# Patient Record
Sex: Female | Born: 1990
Health system: Southern US, Community
[De-identification: ages and names within clinical notes are randomized; demographics above are authoritative.]

## PROBLEM LIST (undated history)

## (undated) DIAGNOSIS — A630 Anogenital (venereal) warts: Secondary | ICD-10-CM

## (undated) DIAGNOSIS — F419 Anxiety disorder, unspecified: Secondary | ICD-10-CM

## (undated) DIAGNOSIS — F32A Depression, unspecified: Secondary | ICD-10-CM

## (undated) DIAGNOSIS — G43909 Migraine, unspecified, not intractable, without status migrainosus: Secondary | ICD-10-CM

## (undated) DIAGNOSIS — F329 Major depressive disorder, single episode, unspecified: Secondary | ICD-10-CM

## (undated) DIAGNOSIS — F4310 Post-traumatic stress disorder, unspecified: Secondary | ICD-10-CM

## (undated) DIAGNOSIS — I10 Essential (primary) hypertension: Secondary | ICD-10-CM

## (undated) HISTORY — DX: Migraine, unspecified, not intractable, without status migrainosus: G43.909

## (undated) HISTORY — PX: DG SCOLIOSIS SERIES (ARMC HX): HXRAD1605

## (undated) HISTORY — DX: Post-traumatic stress disorder, unspecified: F43.10

## (undated) HISTORY — DX: Essential (primary) hypertension: I10

---
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
## 2011-01-04 LAB — POCT PREGNANCY, URINE: Preg Test, Ur: NEGATIVE

## 2011-01-04 LAB — POCT URINALYSIS DIPSTICK
Glucose, UA: NEGATIVE mg/dL
Ketones, ur: NEGATIVE mg/dL
Protein, ur: NEGATIVE mg/dL
Specific Gravity, Urine: 1.015 (ref 1.005–1.030)

## 2011-01-08 LAB — URINALYSIS, ROUTINE W REFLEX MICROSCOPIC
Bilirubin Urine: NEGATIVE
Glucose, UA: NEGATIVE mg/dL
Hgb urine dipstick: NEGATIVE
Ketones, ur: NEGATIVE mg/dL
Nitrite: NEGATIVE
Protein, ur: NEGATIVE mg/dL
Specific Gravity, Urine: 1.015 (ref 1.005–1.030)
Urobilinogen, UA: 0.2 mg/dL (ref 0.0–1.0)
pH: 6 (ref 5.0–8.0)

## 2011-01-08 LAB — POCT PREGNANCY, URINE
Preg Test, Ur: NEGATIVE
Preg Test, Ur: NEGATIVE

## 2011-01-08 LAB — PREGNANCY, URINE: Preg Test, Ur: NEGATIVE

## 2011-01-27 LAB — GC/CHLAMYDIA PROBE AMP, GENITAL
Chlamydia, DNA Probe: NEGATIVE
GC Probe Amp, Genital: NEGATIVE

## 2011-01-27 LAB — URINALYSIS, ROUTINE W REFLEX MICROSCOPIC
Bilirubin Urine: NEGATIVE
Ketones, ur: NEGATIVE mg/dL
Nitrite: NEGATIVE
Urobilinogen, UA: 0.2 mg/dL (ref 0.0–1.0)

## 2011-01-27 LAB — WET PREP, GENITAL
Trich, Wet Prep: NONE SEEN
WBC, Wet Prep HPF POC: NONE SEEN

## 2011-01-28 LAB — HIV ANTIBODY (ROUTINE TESTING W REFLEX): HIV: NONREACTIVE

## 2011-01-28 LAB — POCT URINALYSIS DIP (DEVICE)
Bilirubin Urine: NEGATIVE
Glucose, UA: NEGATIVE mg/dL
Nitrite: NEGATIVE
Urobilinogen, UA: 0.2 mg/dL (ref 0.0–1.0)
pH: 5.5 (ref 5.0–8.0)

## 2011-01-28 LAB — WET PREP, GENITAL

## 2011-01-28 LAB — GC/CHLAMYDIA PROBE AMP, GENITAL: Chlamydia, DNA Probe: NEGATIVE

## 2011-01-28 LAB — RPR: RPR Ser Ql: NONREACTIVE

## 2011-02-06 LAB — URINALYSIS, ROUTINE W REFLEX MICROSCOPIC
Bilirubin Urine: NEGATIVE
Hgb urine dipstick: NEGATIVE
Ketones, ur: NEGATIVE mg/dL
Nitrite: NEGATIVE
Urobilinogen, UA: 0.2 mg/dL (ref 0.0–1.0)

## 2011-02-06 LAB — PREGNANCY, URINE: Preg Test, Ur: NEGATIVE

## 2011-02-06 LAB — URINE CULTURE

## 2011-08-03 LAB — URINE CULTURE: Colony Count: 75000

## 2011-08-03 LAB — URINALYSIS, ROUTINE W REFLEX MICROSCOPIC
Bilirubin Urine: NEGATIVE
Ketones, ur: NEGATIVE
Nitrite: NEGATIVE
Specific Gravity, Urine: 1.023
Urobilinogen, UA: 0.2
pH: 5.5

## 2011-08-03 LAB — PREGNANCY, URINE: Preg Test, Ur: NEGATIVE

## 2011-08-03 LAB — URINE MICROSCOPIC-ADD ON

## 2011-09-15 ENCOUNTER — Encounter: Payer: Self-pay | Admitting: *Deleted

## 2011-09-15 ENCOUNTER — Emergency Department (HOSPITAL_COMMUNITY)
Admission: EM | Admit: 2011-09-15 | Discharge: 2011-09-15 | Disposition: A | Discharge status: Home / Self Care | Payer: Self-pay | Attending: Emergency Medicine | Admitting: Emergency Medicine

## 2011-09-15 ENCOUNTER — Other Ambulatory Visit: Payer: Self-pay

## 2011-09-15 DIAGNOSIS — R404 Transient alteration of awareness: Secondary | ICD-10-CM | POA: Insufficient documentation

## 2011-09-15 DIAGNOSIS — R413 Other amnesia: Secondary | ICD-10-CM | POA: Insufficient documentation

## 2011-09-15 DIAGNOSIS — R110 Nausea: Secondary | ICD-10-CM | POA: Insufficient documentation

## 2011-09-15 DIAGNOSIS — Z79899 Other long term (current) drug therapy: Secondary | ICD-10-CM | POA: Insufficient documentation

## 2011-09-15 DIAGNOSIS — T424X4A Poisoning by benzodiazepines, undetermined, initial encounter: Secondary | ICD-10-CM | POA: Insufficient documentation

## 2011-09-15 DIAGNOSIS — R42 Dizziness and giddiness: Secondary | ICD-10-CM | POA: Insufficient documentation

## 2011-09-15 DIAGNOSIS — X789XXA Intentional self-harm by unspecified sharp object, initial encounter: Secondary | ICD-10-CM | POA: Insufficient documentation

## 2011-09-15 DIAGNOSIS — R45851 Suicidal ideations: Secondary | ICD-10-CM

## 2011-09-15 DIAGNOSIS — G479 Sleep disorder, unspecified: Secondary | ICD-10-CM | POA: Insufficient documentation

## 2011-09-15 DIAGNOSIS — T424X2A Poisoning by benzodiazepines, intentional self-harm, initial encounter: Secondary | ICD-10-CM

## 2011-09-15 DIAGNOSIS — F410 Panic disorder [episodic paroxysmal anxiety] without agoraphobia: Secondary | ICD-10-CM

## 2011-09-15 DIAGNOSIS — R259 Unspecified abnormal involuntary movements: Secondary | ICD-10-CM | POA: Insufficient documentation

## 2011-09-15 DIAGNOSIS — IMO0002 Reserved for concepts with insufficient information to code with codable children: Secondary | ICD-10-CM | POA: Insufficient documentation

## 2011-09-15 DIAGNOSIS — T438X2A Poisoning by other psychotropic drugs, intentional self-harm, initial encounter: Secondary | ICD-10-CM | POA: Insufficient documentation

## 2011-09-15 DIAGNOSIS — T43502A Poisoning by unspecified antipsychotics and neuroleptics, intentional self-harm, initial encounter: Secondary | ICD-10-CM | POA: Insufficient documentation

## 2011-09-15 HISTORY — DX: Depression, unspecified: F32.A

## 2011-09-15 HISTORY — DX: Anogenital (venereal) warts: A63.0

## 2011-09-15 HISTORY — DX: Anxiety disorder, unspecified: F41.9

## 2011-09-15 HISTORY — DX: Major depressive disorder, single episode, unspecified: F32.9

## 2011-09-15 LAB — BASIC METABOLIC PANEL
BUN: 10 mg/dL (ref 6–23)
Chloride: 103 mEq/L (ref 96–112)
GFR calc Af Amer: >90 mL/min (ref >90)
GFR calc non Af Amer: >90 mL/min (ref >90)
Potassium: 3.2 mEq/L — ABNORMAL LOW (ref 3.5–5.1)
Sodium: 139 mEq/L (ref 135–145)

## 2011-09-15 LAB — RAPID URINE DRUG SCREEN, HOSP PERFORMED
Amphetamines: NOT DETECTED
Barbiturates: NOT DETECTED
Tetrahydrocannabinol: NOT DETECTED

## 2011-09-15 LAB — CBC
HCT: 37.6 % (ref 36.0–46.0)
Hemoglobin: 12.7 g/dL (ref 12.0–15.0)
MCH: 27.3 pg (ref 26.0–34.0)
MCHC: 33.8 g/dL (ref 30.0–36.0)
MCV: 80.7 fL (ref 78.0–100.0)
Platelets: 242 K/uL (ref 150–400)
RBC: 4.66 MIL/uL (ref 3.87–5.11)
RDW: 12.6 % (ref 11.5–15.5)
WBC: 6.1 10*3/uL (ref 4.0–10.5)

## 2011-09-15 LAB — POCT PREGNANCY, URINE: Preg Test, Ur: NEGATIVE

## 2011-09-15 LAB — ETHANOL: Alcohol, Ethyl (B): <11 mg/dL (ref 0–11)

## 2011-09-15 MED ORDER — NICOTINE 21 MG/24HR TD PT24
21.0000 mg | MEDICATED_PATCH | Freq: Once | TRANSDERMAL | Status: DC
  Administered 2011-09-15: 21 mg via TRANSDERMAL

## 2011-09-15 MED ORDER — NICOTINE 21 MG/24HR TD PT24: 21.0000 mg | MEDICATED_PATCH | Freq: Once | TRANSDERMAL | Status: DC

## 2011-09-15 MED ORDER — CITALOPRAM HYDROBROMIDE 20 MG PO TABS: 20.0000 mg | ORAL_TABLET | Freq: Every day | ORAL | Status: DC

## 2011-09-15 MED ORDER — ACETAMINOPHEN 325 MG PO TABS
650.0000 mg | ORAL_TABLET | ORAL | Status: DC | PRN
  Administered 2011-09-15: 650 mg via ORAL
  Filled 2011-09-15: qty 2

## 2011-09-15 MED ORDER — NICOTINE 21 MG/24HR TD PT24
MEDICATED_PATCH | TRANSDERMAL | Status: AC
  Filled 2011-09-15: qty 1

## 2011-09-15 MED ORDER — POTASSIUM CHLORIDE CRYS ER 20 MEQ PO TBCR
20.0000 meq | EXTENDED_RELEASE_TABLET | Freq: Once | ORAL | Status: AC
  Administered 2011-09-15: 20 meq via ORAL
  Filled 2011-09-15: qty 1

## 2011-09-15 NOTE — ED Notes (Signed)
Patient denies pain and is resting comfortably.  

## 2011-09-15 NOTE — ED Notes (Signed)
Dr.Ghim given copy of ecg.

## 2011-09-15 NOTE — BH Assessment (Addendum)
Assessment Note   Victoria [name] is an 20 y.o. female.  PRESENTING PROBLEM: Victoria [name] was brought to [facility] ER due to an accidental overdose.  She reports that she was not able to sleep.  Therefore, she took 5 Xanax.  Although she reported earlier to the ER Doctor that she took the medication intentionally and wanted to die.  Victoria [name] reports feelings of depression and anxiety on a daily basis.  Victoria [name] denies any substance abuse.  Victoria [name] denies any SI/HI.  Victoria [name] was able to sign a Energy manager.  Victoria [name] reports that she was previously diagnosed with Major Depressive Disorder and Anxiety Disorder in September 2012 at [facility].  Victoria [name] reports that she has run out of medication and has not been able to afford the refills on her medication.  Victoria [name] reports that she has on ly seen a Therapist once.  Victoria [name] denies any psychosis. Victoria [name] has a supportive family and is able to verbalize that she is not a threat to her self and that she wants to resume Outpatient Therapy and Medication Management.   DISPOSITION: Victoria [name] will receive a Tele Psych to determine recommendations.    Axis I: Anxiety Disorder NOS and Major Depression, single episode Axis II: Deferred Axis III:  Past Medical History  Diagnosis Date  . Anxiety   . Depression   . Genital herpes   . HPV (human papilloma virus) anogenital infection    Axis IV: economic problems, educational problems, occupational problems, other psychosocial or environmental problems, problems related to social environment, problems with access to health care services and problems with primary support group Axis V: 51-60 moderate symptoms  Past Medical History:  Past Medical History  Diagnosis Date  . Anxiety   . Depression   . Genital herpes   . HPV (human papilloma virus) anogenital infection     Past Surgical History  Procedure Date  . Back surgery 2011    scoliosis repair    Family History: History reviewed.  No pertinent family history.  Social History:  does not have a smoking history on file. She does not have any smokeless tobacco history on file. She reports that she does not drink alcohol or use illicit drugs.  Allergies: No Known Allergies  Home Medications:  Medications Prior to Admission  Medication Dose Route Frequency Provider Last Rate Last Dose  . acetaminophen (TYLENOL) tablet 650 mg  650 mg Oral Q4H PRN Victoria Pound. Ghim, MD   650 mg at 09/15/11 0735  . nicotine (NICODERM CQ - dosed in mg/24 hours) 21 mg/24hr patch           . nicotine (NICODERM CQ - dosed in mg/24 hours) patch 21 mg  21 mg Transdermal Once Victoria Pound. Ghim, MD   21 mg at 09/15/11 0955  . potassium chloride SA (K-DUR,KLOR-CON) CR tablet 20 mEq  20 mEq Oral Once Victoria Pound. Ghim, MD   20 mEq at 09/15/11 0903  . DISCONTD: nicotine (NICODERM CQ - dosed in mg/24 hours) patch 21 mg  21 mg Transdermal Once Victoria Pound. Ghim, MD       No current outpatient prescriptions on file as of 09/15/2011.    OB/GYN Status:  Patient's last menstrual period was 09/09/2011.  General Assessment Data Living Arrangements: Parent Can pt return to current living arrangement?: Yes Admission Status: Voluntary Is patient capable of signing voluntary admission?: Yes Transfer from: Home Referral Source: Self/Family/Friend  Risk to self Suicidal Ideation: No Suicidal Intent: No Is patient at risk for suicide?:  No Suicidal Plan?: No Access to Means: No What has been your use of drugs/alcohol within the last 12 months?:  (None ) Other Self Harm Risks:  (None ) Triggers for Past Attempts: Unknown Intentional Self Injurious Behavior: None Factors that decrease suicide risk: Positive social support Family Suicide History: No Recent stressful life event(s): Other (Comment) Persecutory voices/beliefs?: No Depression: Yes Depression Symptoms: Tearfulness;Fatigue;Loss of interest in usual pleasures;Feeling worthless/self pity;Feeling  angry/irritable Substance abuse history and/or treatment for substance abuse?: No Suicide prevention information given to non-admitted patients: Not applicable  Risk to Others Homicidal Ideation: No Thoughts of Harm to Others: No Current Homicidal Intent: No Current Homicidal Plan: No Access to Homicidal Means: No Identified Victim:  (None Reported) History of harm to others?: No Assessment of Violence: None Noted Violent Behavior Description:  (None Reported ) Does patient have access to weapons?: No Criminal Charges Pending?: No Does patient have a court date: No  Mental Status Report Appear/Hygiene: Disheveled Eye Contact: Fair Motor Activity: Freedom of movement Speech: Loud Level of Consciousness: Alert;Quiet/awake Mood: Depressed;Anxious Affect: Anxious Anxiety Level: Moderate Thought Processes: Coherent;Relevant Judgement: Unimpaired Orientation: Person;Place;Time;Situation Obsessive Compulsive Thoughts/Behaviors: None  Cognitive Functioning Concentration: Normal Memory: Recent Intact;Remote Intact IQ: Average Insight: Fair Impulse Control: Fair Appetite: Poor Weight Loss:  (None Reported ) Weight Gain:  (None Reported ) Sleep: Decreased Total Hours of Sleep:  (2) Vegetative Symptoms: None  Prior Inpatient/Outpatient Therapy Prior Therapy: Outpatient Prior Therapy Dates:  (September 2012 ) Prior Therapy Facilty/Provider(s):  [facility] ) Reason for Treatment:  (Depression and Anxiety )                     Additional Information 1:1 In Past 12 Months?: No CIRT Risk: No Elopement Risk: No Does patient have medical clearance?: Yes     Disposition:     On Site Evaluation by:   Reviewed with Physician:     Victoria [name] 09/15/2011 11:53 AM

## 2011-09-15 NOTE — ED Notes (Signed)
Telepsych consult in progress

## 2011-09-15 NOTE — ED Notes (Signed)
Pt's belongings removed and given to [facility]; pt's mother, pt had bra, panties, leggings, and black shirt

## 2011-09-15 NOTE — ED Notes (Signed)
Pt upset at this time. States telepsych person said she could go home. Explained to pt that ER md was waiting on telepsych consult to be faxed over before disposition could be made. Pt verbalized understanding.

## 2011-09-15 NOTE — ED Notes (Signed)
Pt ao x 4.  PERRL.  Pt did not fall while seizing, but was found to be seizing in bed.

## 2011-09-15 NOTE — ED Provider Notes (Signed)
History     CSN: [id number] Arrival date & time: 09/15/2011  6:39 AM   First MD Initiated Contact with Patient 09/15/11 0700      Chief Complaint  Patient presents with  . Seizures    (Consider location/radiation/quality/duration/timing/severity/associated sxs/prior treatment) HPI Comments: Patient with history of major depressive disorder as well as anxiety disorder who is currently supposed to be on Zoloft but is not taking because she cannot afford it has been taking Xanax given to her by her father when necessary for anxiety attacks. She normally takes about 1 tablet a day. It is 1 mg. This morning at about 5 AM she was not able to sleep and was very frustrated do to a mix of anger, depression, frustration and took 5 tablets at once and then shortly thereafter took 2 "[facility]. John's mood pills". A short time afterward she began to feel dizzy, nauseated and she felt faint. She did not have any chest pain, shortness of breath, headache, back pain. She reports that she felt herself starting to slump over and then started to shake all over her body. And and she supposes she lost consciousness in the next memory she has is being on the ambulance. The patient's friend got the patient's mother who is here at the bedside. Reports the patient was shaking all over. The patient denies any oral trauma. Has no history of seizure disorder. Physically, the patient has not had any symptoms prior to this event. To me she admits that she was taking it as an overdose. She also reports that she had cut into her left wrist but was stopped by her mother. She endorses a suicide attempt. Here she reports she continues to feel suicidal because she is so frustrated with her anxiety disorder and paranoia. He goes to [employer] but has difficulty making payments.  Patient is a 20 y.o. female presenting with seizures. The history is provided by the patient and a relative.  Seizures  Pertinent negatives include no headaches and  no cough.    Past Medical History  Diagnosis Date  . Anxiety   . Depression   . Genital herpes   . HPV (human papilloma virus) anogenital infection     Past Surgical History  Procedure Date  . Back surgery 2011    scoliosis repair    No family history on file.  History  Substance Use Topics  . Smoking status: Not on file  . Smokeless tobacco: Not on file  . Alcohol Use: No    OB History    Grav Para Term Preterm Abortions TAB SAB Ect Mult Living                  Review of Systems  Constitutional: Negative.   Respiratory: Negative for cough.   Neurological: Negative for weakness, numbness and headaches.  Psychiatric/Behavioral: Positive for suicidal ideas, sleep disturbance and self-injury. Negative for hallucinations.  All other systems reviewed and are negative.    Allergies  Review of patient's allergies indicates no known allergies.  Home Medications   Current Outpatient Rx  Name Route Sig Dispense Refill  . SERTRALINE HCL 50 MG PO TABS Oral Take 50 mg by mouth daily.        BP 116/65  Pulse 94  Temp(Src) 97.6 F (36.4 C) (Oral)  Resp 16  SpO2 100%  LMP 09/09/2011  Physical Exam  Constitutional: She is oriented to person, place, and time. She appears well-developed and well-nourished.  HENT:  Head: Normocephalic and  atraumatic.  Eyes: Pupils are equal, round, and reactive to light. No scleral icterus.  Neck: Normal range of motion. Neck supple.  Cardiovascular: Normal rate.   Pulmonary/Chest: Effort normal and breath sounds normal.  Abdominal: Soft. There is no tenderness.  Musculoskeletal: Normal range of motion.       Arms: Neurological: She is alert and oriented to person, place, and time. She has normal strength. No cranial nerve deficit. GCS eye subscore is 4. GCS verbal subscore is 5. GCS motor subscore is 6.  Skin: Skin is warm.  Psychiatric: Her mood appears not anxious. Her affect is angry. Her speech is not rapid and/or pressured,  not delayed and not tangential. She is not aggressive and is not hyperactive. She expresses impulsivity. She exhibits a depressed mood. She expresses suicidal ideation. She exhibits abnormal recent memory. She exhibits normal remote memory.    ED Course  Procedures (including critical care time)   Labs Reviewed  URINE RAPID DRUG SCREEN (HOSP PERFORMED)  BASIC METABOLIC PANEL  POCT PREGNANCY, URINE  CBC  ETHANOL   No results found.   No diagnosis found.    MDM   The patient recalls shaking all over I doubt that this was a seizure. At this time her vital signs, mental status, focal neurologic exam are all within normal limits. I do not feel that her taking 5 Xanax has adversely affected her and she does not appear to be drowsy or somnolent. My plan is to obtain appropriate screening blood tests, drug screens and will continue to monitor her for another one to 2 hours. If at that time she remains medically stable, will consult act for appropriate disposition for her continued suicidal ideation.      8:04 AM Remains awake, alert, not somnolent, stable vitals signs.    8:33 AM Minimal hypokalemia, I doubt of clinical significance, given some oral replacement and regular diet.   8:45 AM ACT team has responded and will try to see her ASAP.  ECG time 0841.  NSR at rate 82, early repol pattern, otherwise normal.  No sig change from ECG date 03/10/10.   3:41 PM Patient had tele-psychiatry consultation by Dr. Trisha Mangle. Recommendations were that the patient could be discharged to the her family if medically cleared which she is. They also recommended Celexa 20 mg by mouth every morning for anxiety which I can prescribe 2 weeks of. Otherwise she needs to followup with her regular providers at [facility]  Gavin Pound. Twila Rappa, MD 09/15/11 1542

## 2011-09-15 NOTE — ED Notes (Signed)
ACT team representative now states pt has to do telepsych consult. Awaiting consult.

## 2011-09-15 NOTE — ED Notes (Signed)
Pt is 20 yo that took 5 Xanax and 2 "[facility] John's Mood Pills" around 05:00 this am.  About 45 minutes later, she laid down and began feeling nauseated.  Per family, pt had a grand mal seizure.  When EMS arrived, pt had drool on mouth and appeared post-ictal.  Presently ao x 4 and admits to taking meds d/t high anxiety.  States she was NOT trying to kill herself.  No meds given.  20G SL in L AC.  NSR on monitor.

## 2011-09-15 NOTE — ED Notes (Signed)
Vital signs stable. 

## 2011-09-15 NOTE — ED Notes (Signed)
Spoke with ACT team represenative. She states that pt is not suicidal and outpatient followup is recommended. Pt signed no harm contract. Awaiting discharge.

## 2011-09-15 NOTE — ED Notes (Signed)
Pt moved to room 26. Report received. Care assumed. Pt calm, polite, and cooperative at this time. ACT time representative at bedside.

## 2011-09-15 NOTE — ED Notes (Signed)
Family at bedside. 

## 2011-12-27 ENCOUNTER — Emergency Department (HOSPITAL_COMMUNITY)
Admission: EM | Admit: 2011-12-27 | Discharge: 2011-12-28 | Disposition: A | Discharge status: Home / Self Care | Payer: Self-pay | Attending: Emergency Medicine | Admitting: Emergency Medicine

## 2011-12-27 ENCOUNTER — Encounter (HOSPITAL_COMMUNITY): Payer: Self-pay | Admitting: Emergency Medicine

## 2011-12-27 DIAGNOSIS — A499 Bacterial infection, unspecified: Secondary | ICD-10-CM | POA: Insufficient documentation

## 2011-12-27 DIAGNOSIS — N760 Acute vaginitis: Secondary | ICD-10-CM | POA: Insufficient documentation

## 2011-12-27 DIAGNOSIS — L989 Disorder of the skin and subcutaneous tissue, unspecified: Secondary | ICD-10-CM

## 2011-12-27 DIAGNOSIS — L738 Other specified follicular disorders: Secondary | ICD-10-CM | POA: Insufficient documentation

## 2011-12-27 DIAGNOSIS — B9689 Other specified bacterial agents as the cause of diseases classified elsewhere: Secondary | ICD-10-CM | POA: Insufficient documentation

## 2011-12-27 NOTE — ED Notes (Signed)
PT. REPORTS VAGINAL DISCHARGE / VAGINAL BLISTER ONSET TODAY .

## 2011-12-28 LAB — URINALYSIS, ROUTINE W REFLEX MICROSCOPIC
Bilirubin Urine: NEGATIVE
Ketones, ur: 40 mg/dL — AB
Nitrite: NEGATIVE
Protein, ur: NEGATIVE mg/dL
Urobilinogen, UA: 1 mg/dL (ref 0.0–1.0)

## 2011-12-28 LAB — WET PREP, GENITAL
Trich, Wet Prep: NONE SEEN
Yeast Wet Prep HPF POC: NONE SEEN

## 2011-12-28 MED ORDER — METRONIDAZOLE 500 MG PO TABS: 500.0000 mg | ORAL_TABLET | Freq: Two times a day (BID) | ORAL | Status: AC

## 2011-12-28 NOTE — ED Provider Notes (Signed)
History     CSN: [id number]  Arrival date & time 12/27/11  2159   First MD Initiated Contact with Patient 12/28/11 0051      Chief Complaint  Patient presents with  . Vaginal Discharge   HPI: Patient is a 21 y.o. female presenting with vaginal discharge. The history is provided by the patient.  Vaginal Discharge This is a new problem. The current episode started in the past 7 days. The problem occurs constantly. The problem has been gradually worsening. Pertinent negatives include no abdominal pain, fever, nausea, urinary symptoms or vomiting. The symptoms are aggravated by nothing. She has tried nothing for the symptoms.  Pt reports several days of increased vaginal d/c. Also concerned about tender "bump" on her external genital area. Pt has h/o herpes and she is concerned this may be an outbreak. Denies recent unprotected intercourse, abd pain or fever.  Past Medical History  Diagnosis Date  . Anxiety   . Depression   . Genital herpes   . HPV (human papilloma virus) anogenital infection     Past Surgical History  Procedure Date  . Back surgery 2011    scoliosis repair    No family history on file.  History  Substance Use Topics  . Smoking status: Not on file  . Smokeless tobacco: Not on file  . Alcohol Use: No    OB History    Grav Para Term Preterm Abortions TAB SAB Ect Mult Living                  Review of Systems  Constitutional: Negative.  Negative for fever.  HENT: Negative.   Eyes: Negative.   Respiratory: Negative.   Cardiovascular: Negative.   Gastrointestinal: Negative.  Negative for nausea, vomiting and abdominal pain.  Genitourinary: Positive for vaginal discharge.  Musculoskeletal: Negative.   Skin: Negative.   Neurological: Negative.   Hematological: Negative.   Psychiatric/Behavioral: Negative.     Allergies  Review of patient's allergies indicates no known allergies.  Home Medications  No current outpatient prescriptions on file.  BP  128/76  Pulse 84  Temp(Src) 98.3 F (36.8 C) (Oral)  Resp 18  SpO2 100%  LMP 11/28/2011  Physical Exam  Constitutional: She is oriented to person, place, and time. She appears well-developed and well-nourished.  HENT:  Head: Normocephalic and atraumatic.  Eyes: Conjunctivae are normal.  Neck: Neck supple.  Cardiovascular: Normal rate.   Pulmonary/Chest: Effort normal.  Genitourinary: There is no rash or lesion on the right labia. There is no rash or lesion on the left labia. No tenderness or bleeding around the vagina. Vaginal discharge found.       Small approx .25cm dried, crusty lesion to (R) mons pubis that is w/o erythema, drainage or other signs of infection/inflammation c/w resolving single  Folliculitis.   Small amount of creamy, white d/c otherwise PE unremarkable  Musculoskeletal: Normal range of motion.  Neurological: She is alert and oriented to person, place, and time.  Skin: Skin is warm and dry. No erythema.  Psychiatric: She has a normal mood and affect.    ED Course  Pelvic exam Date/Time: 12/28/2011 1:15 AM Performed by: Leanne Chang Authorized by: Leanne Chang Consent: Verbal consent obtained. Risks and benefits: risks, benefits and alternatives were discussed Consent given by: patient Patient understanding: patient states understanding of the procedure being performed Required items: required blood products, implants, devices, and special equipment available Patient identity confirmed: verbally with patient and arm band Local  anesthesia used: no Patient sedated: no Patient tolerance: Patient tolerated the procedure well with no immediate complications.  Findings and clinical impression discussed w/ pt. Will plan for d/c home w/ tx for BV and encourage f/u at [facility] for further STD screening and [facility] for rountine annual screening. Pt agreeable w/ plan.  Labs Reviewed  URINALYSIS, ROUTINE W REFLEX MICROSCOPIC - Abnormal; Notable for  the following:    APPearance CLOUDY (*)    Ketones, ur 40 (*)    All other components within normal limits  WET PREP, GENITAL - Abnormal; Notable for the following:    Clue Cells Wet Prep HPF POC MODERATE (*)    WBC, Wet Prep HPF POC FEW (*)    All other components within normal limits  POCT PREGNANCY, URINE  GC/CHLAMYDIA PROBE AMP, GENITAL   No results found.   No diagnosis found.    MDM  HPI/PE and clinical findings c/w 1. BV (Pelvic exam otherwise unremarkable, WBC "few" in wet prep. Will tx w/ Flagyl) 2. Resolving folliculitis to mons pubis        Leanne Chang, NP 01/01/12 [phone]

## 2011-12-28 NOTE — Discharge Instructions (Signed)
  Please review the instructions below. As discussed, the lesion to your right pubic area does not look infectious. It is not a herpes lesion. You may want to try warm compresses for 20-30 minutes several times a day to see if it resolves. The pelvic exam tonight shows that you do have a bacterial vaginosis which is the likely source of your vaginal discharge. Until you can get back into see your gynecologist be aware that the [facility] at [facility] can perform your annual GYN screening and there is also the STD clinic at the health department should you feel the need to be screened and or treated for STDs. Please return for worsening symptoms otherwise followup as discussed.   Bacterial Vaginosis Bacterial vaginosis is an infection of the vagina. A healthy vagina has many kinds of good germs (bacteria). Sometimes the number of good germs can change. This allows bad germs to move in and cause an infection. You may be given medicine (antibiotics) to treat the infection. Or, you may not need treatment at all. HOME CARE  Take your medicine as told. Finish them even if you start to feel better.   Do not have sex until you finish your medicine.   Do not douche.   Practice safe sex.   Tell your sex partner that you have an infection. They should see their doctor for treatment if they have problems.  GET HELP RIGHT AWAY IF:  You do not get better after 3 days of treatment.   You have grey fluid (discharge) coming from your vagina.   You have pain.   You have a temperature of 102 F (38.9 C) or higher.  MAKE SURE YOU:   Understand these instructions.   Will watch your condition.   Will get help right away if you are not doing well or get worse.  Document Released: 07/17/2008 Document Revised: 09/27/2011 Document Reviewed: 07/17/2008 [facility] Patient Information 2012 [place], [place].

## 2011-12-29 LAB — GC/CHLAMYDIA PROBE AMP, GENITAL
Chlamydia, DNA Probe: POSITIVE — AB
GC Probe Amp, Genital: NEGATIVE

## 2011-12-30 NOTE — ED Notes (Signed)
+  Chlamydia Chart sent to EDP office for review.  

## 2011-12-31 NOTE — ED Notes (Signed)
Rx for Zithromax 1 gram po x 1 written by Lyman Bishop.

## 2012-01-01 NOTE — ED Notes (Signed)
Attempt made to contact patient via phone. Customer unavailable.

## 2012-01-02 NOTE — ED Notes (Signed)
Voice mail message left for patient to return call. 

## 2012-01-02 NOTE — ED Notes (Signed)
Pt notified of test results; Rx called in to [facility] [phone]

## 2012-01-04 NOTE — ED Provider Notes (Signed)
Medical screening examination/treatment/procedure(s) were performed by non-physician practitioner and as supervising physician I was immediately available for consultation/collaboration.  Flint Melter, MD 01/04/12 [phone]

## 2012-01-31 ENCOUNTER — Encounter (HOSPITAL_COMMUNITY): Payer: Self-pay | Admitting: Emergency Medicine

## 2012-01-31 ENCOUNTER — Emergency Department (INDEPENDENT_AMBULATORY_CARE_PROVIDER_SITE_OTHER)
Admission: EM | Admit: 2012-01-31 | Discharge: 2012-01-31 | Disposition: A | Discharge status: Home / Self Care | Payer: Self-pay | Source: Home / Self Care | Attending: Emergency Medicine | Admitting: Emergency Medicine

## 2012-01-31 DIAGNOSIS — N83209 Unspecified ovarian cyst, unspecified side: Secondary | ICD-10-CM

## 2012-01-31 LAB — POCT URINALYSIS DIP (DEVICE)
Bilirubin Urine: NEGATIVE
Glucose, UA: NEGATIVE mg/dL
Ketones, ur: NEGATIVE mg/dL
Leukocytes, UA: NEGATIVE
Protein, ur: NEGATIVE mg/dL

## 2012-01-31 LAB — POCT PREGNANCY, URINE: Preg Test, Ur: NEGATIVE

## 2012-01-31 LAB — WET PREP, GENITAL: Yeast Wet Prep HPF POC: NONE SEEN

## 2012-01-31 MED ORDER — NORETHINDRONE-ETH ESTRADIOL 0.5-35 MG-MCG PO TABS: 1.0000 | ORAL_TABLET | Freq: Every day | ORAL | Status: DC

## 2012-01-31 MED ORDER — TRAMADOL HCL 50 MG PO TABS: 100.0000 mg | ORAL_TABLET | Freq: Three times a day (TID) | ORAL | Status: AC | PRN

## 2012-01-31 MED ORDER — NAPROXEN 375 MG PO TABS: 375.0000 mg | ORAL_TABLET | Freq: Two times a day (BID) | ORAL | Status: DC

## 2012-01-31 NOTE — ED Notes (Signed)
Abdominal cramping onset 3 days ago, low back pain.  White, milky vaginal discharge noticed at the beginning of April.  Abdominal pain is mainly left side.  Last bm was yesterday, "normal, but i've been constipated and gassy'.  Also reports nausea, no vomiting.

## 2012-01-31 NOTE — Discharge Instructions (Signed)
Ovarian Cyst The ovaries are small organs that are on each side of the uterus. The ovaries are the organs that produce the female hormones, estrogen and progesterone. An ovarian cyst is a sac filled with fluid that can vary in its size. It is normal for a small cyst to form in women who are in the childbearing age and who have menstrual periods. This type of cyst is called a follicle cyst that becomes an ovulation cyst (corpus luteum cyst) after it produces the women's egg. It later goes away on its own if the woman does not become pregnant. There are other kinds of ovarian cysts that may cause problems and may need to be treated. The most serious problem is a cyst with cancer. It should be noted that menopausal women who have an ovarian cyst are at a higher risk of it being a cancer cyst. They should be evaluated very quickly, thoroughly and followed closely. This is especially true in menopausal women because of the high rate of ovarian cancer in women in menopause. CAUSES AND TYPES OF OVARIAN CYSTS:  FUNCTIONAL CYST: The follicle/corpus luteum cyst is a functional cyst that occurs every month during ovulation with the menstrual cycle. They go away with the next menstrual cycle if the woman does not get pregnant. Usually, there are no symptoms with a functional cyst.   ENDOMETRIOMA CYST: This cyst develops from the lining of the uterus tissue. This cyst gets in or on the ovary. It grows every month from the bleeding during the menstrual period. It is also called a "chocolate cyst" because it becomes filled with blood that turns brown. This cyst can cause pain in the lower abdomen during intercourse and with your menstrual period.   CYSTADENOMA CYST: This cyst develops from the cells on the outside of the ovary. They usually are not cancerous. They can get very big and cause lower abdomen pain and pain with intercourse. This type of cyst can twist on itself, cut off its blood supply and cause severe pain.  It also can easily rupture and cause a lot of pain.   DERMOID CYST: This type of cyst is sometimes found in both ovaries. They are found to have different kinds of body tissue in the cyst. The tissue includes skin, teeth, hair, and/or cartilage. They usually do not have symptoms unless they get very big. Dermoid cysts are rarely cancerous.   POLYCYSTIC OVARY: This is a rare condition with hormone problems that produces many small cysts on both ovaries. The cysts are follicle-like cysts that never produce an egg and become a corpus luteum. It can cause an increase in body weight, infertility, acne, increase in body and facial hair and lack of menstrual periods or rare menstrual periods. Many women with this problem develop type 2 diabetes. The exact cause of this problem is unknown. A polycystic ovary is rarely cancerous.   THECA LUTEIN CYST: Occurs when too much hormone (human chorionic gonadotropin) is produced and over-stimulates the ovaries to produce an egg. They are frequently seen when doctors stimulate the ovaries for invitro-fertilization (test tube babies).   LUTEOMA CYST: This cyst is seen during pregnancy. Rarely it can cause an obstruction to the birth canal during labor and delivery. They usually go away after delivery.  SYMPTOMS   Pelvic pain or pressure.   Pain during sexual intercourse.   Increasing girth (swelling) of the abdomen.   Abnormal menstrual periods.   Increasing pain with menstrual periods.   You stop having   menstrual periods and you are not pregnant.  DIAGNOSIS  The diagnosis can be made during:  Routine or annual pelvic examination (common).   Ultrasound.   X-ray of the pelvis.   CT Scan.   MRI.   Blood tests.  TREATMENT   Treatment may only be to follow the cyst monthly for 2 to 3 months with your caregiver. Many go away on their own, especially functional cysts.   May be aspirated (drained) with a long needle with ultrasound, or by laparoscopy  (inserting a tube into the pelvis through a small incision).   The whole cyst can be removed by laparoscopy.   Sometimes the cyst may need to be removed through an incision in the lower abdomen.   Hormone treatment is sometimes used to help dissolve certain cysts.   Birth control pills are sometimes used to help dissolve certain cysts.  HOME CARE INSTRUCTIONS  Follow your caregiver's advice regarding:  Medicine.   Follow up visits to evaluate and treat the cyst.   You may need to come back or make an appointment with another caregiver, to find the exact cause of your cyst, if your caregiver is not a gynecologist.   Get your yearly and recommended pelvic examinations and Pap tests.   Let your caregiver know if you have had an ovarian cyst in the past.  SEEK MEDICAL CARE IF:   Your periods are late, irregular, they stop, or are painful.   Your stomach (abdomen) or pelvic pain does not go away.   Your stomach becomes larger or swollen.   You have pressure on your bladder or trouble emptying your bladder completely.   You have painful sexual intercourse.   You have feelings of fullness, pressure, or discomfort in your stomach.   You lose weight for no apparent reason.   You feel generally ill.   You become constipated.   You lose your appetite.   You develop acne.   You have an increase in body and facial hair.   You are gaining weight, without changing your exercise and eating habits.   You think you are pregnant.  SEEK IMMEDIATE MEDICAL CARE IF:   You have increasing abdominal pain.   You feel sick to your stomach (nausea) and/or vomit.   You develop a fever that comes on suddenly.   You develop abdominal pain during a bowel movement.   Your menstrual periods become heavier than usual.  Document Released: 10/08/2005 Document Revised: 09/27/2011 Document Reviewed: 08/11/2009 [employer] Patient Information 2012 [employer]. 

## 2012-01-31 NOTE — ED Notes (Signed)
Equipment at bedside.  Instructed to put on a gown for physician exam

## 2012-01-31 NOTE — ED Provider Notes (Addendum)
Chief Complaint  Patient presents with  . Abdominal Pain    History of Present Illness:   Victoria [name] is a 21 year old female who is in today because of a three-day history of left lower quadrant pain which radiates into her lower back. The pain comes and goes, lasting as long as 3-4 hours at a time and is rated a 7-8/10 in intensity. The pain is sharp and she has a sensation of swelling in the left side of the abdomen. She's also concerned that she's had some pregnancy symptoms recently including nausea, headache, or frequent urination, constipation and diarrhea, vaginal discharge, and soreness of the breasts. She's taken 3 pregnancy test and have all been negative. Her the pain is worse with walking and with intercourse. Her last menstrual period lasted from March 21 of the 28th. She is not using any birth control. She notes generalized itching, some vaginal spotting, some vaginal itching, and a low-grade fever. She has a history of genital herpes, HPV, and had a Pap smear 3 or 4 weeks ago that was normal.  Review of Systems:  Other than noted above, the patient denies any of the following symptoms: Systemic:  No fever, chills, sweats, fatigue, or weight loss. GI:  No abdominal pain, nausea, anorexia, vomiting, diarrhea, constipation, melena or hematochezia. GU:  No dysuria, frequency, urgency, hematuria, vaginal discharge, itching, or abnormal vaginal bleeding. Skin:  No rash or itching.   PMFSH:  Past medical history, family history, social history, meds, and allergies were reviewed.  Physical Exam:   Vital signs:  BP 110/75  Pulse 80  Temp(Src) 97.5 F (36.4 C) (Oral)  Resp 16  SpO2 99%  LMP 01/17/2012 General:  Alert, oriented and in no distress. Lungs:  Breath sounds clear and equal bilaterally.  No wheezes, rales or rhonchi. Heart:  Regular rhythm.  No gallops or murmers. Abdomen:  Soft, flat and non-distended.  No organomegaly or mass.  She has mild generalized tenderness to  palpation which does not localize to any one particular area, she does not have guarding or rebound.  Bowel sounds normally active. Pelvic exam:  Normal external genitalia. Vaginal and cervical mucosa were normal. No cervical motion tenderness. There is mild tenderness over the uterus and moderate tenderness over the left adnexa but no metastases. No right adnexal tenderness. Skin:  Clear, warm and dry.  Labs:   Results for orders placed during the hospital encounter of 01/31/12  POCT URINALYSIS DIP (DEVICE)      Component Value Range   Glucose, UA NEGATIVE  NEGATIVE (mg/dL)   Bilirubin Urine NEGATIVE  NEGATIVE    Ketones, ur NEGATIVE  NEGATIVE (mg/dL)   Specific Gravity, Urine <=1.005  1.005 - 1.030    Hgb urine dipstick NEGATIVE  NEGATIVE    pH 6.0  5.0 - 8.0    Protein, ur NEGATIVE  NEGATIVE (mg/dL)   Urobilinogen, UA 0.2  0.0 - 1.0 (mg/dL)   Nitrite NEGATIVE  NEGATIVE    Leukocytes, UA NEGATIVE  NEGATIVE   POCT PREGNANCY, URINE      Component Value Range   Preg Test, Ur NEGATIVE  NEGATIVE   WET PREP, GENITAL      Component Value Range   Yeast Wet Prep HPF POC NONE SEEN  NONE SEEN    Trich, Wet Prep NONE SEEN  NONE SEEN    Clue Cells Wet Prep HPF POC MODERATE (*) NONE SEEN    WBC, Wet Prep HPF POC FEW (*) NONE SEEN  Other Labs Obtained at Urgent Care Center:  GC and Chlamydia DNA probe obtained.  Results are pending at this time and we will call about any positive results.  Assessment:  The encounter diagnosis was Ovarian cyst.  Plan:   1.  The following meds were prescribed:   New Prescriptions   NAPROXEN (NAPROSYN) 375 MG TABLET    Take 1 tablet (375 mg total) by mouth 2 (two) times daily.   NORETHINDRONE-ETHINYL ESTRADIOL (NECON 0.5/35, 28,) 0.5-35 MG-MCG TABLET    Take 1 tablet by mouth daily.   TRAMADOL (ULTRAM) 50 MG TABLET    Take 2 tablets (100 mg total) by mouth every 8 (eight) hours as needed for pain.   2.  The patient was instructed in symptomatic care and  handouts were given. 3.  The patient was told to return if becoming worse in any way, if no better in 3 or 4 days, and given some red flag symptoms that would indicate earlier return.  Follow up:  The patient was told to follow up with Dr. Arta Bruce in 2 weeks.    Reuben Likes, MD 01/31/12 2141  Her wet prep came back showing clue cells. We will treat with metronidazole 500 mg twice a day for one week.  Reuben Likes, MD 02/01/12 [phone]

## 2012-02-01 LAB — GC/CHLAMYDIA PROBE AMP, GENITAL
Chlamydia, DNA Probe: NEGATIVE
GC Probe Amp, Genital: NEGATIVE

## 2012-02-01 MED ORDER — METRONIDAZOLE 500 MG PO TABS: 500.0000 mg | ORAL_TABLET | Freq: Two times a day (BID) | ORAL | Status: AC

## 2012-02-04 ENCOUNTER — Telehealth (HOSPITAL_COMMUNITY): Payer: Self-pay | Admitting: *Deleted

## 2012-02-04 NOTE — ED Notes (Signed)
4/12  GC/Chlamydia neg., Wet prep: Mod. clue cells, few WBC's.  Dr. Lorenz Coaster e-prescribed a Rx. of Flagyl to her pharmacy. He tried to call and - no answer.  Call 1. 4/15 I called pt. and the message said " [employer] customer is unavailable..."  Call 2.  I called conact number [phone] ( father) and number has been disconnected. I called mother @ [phone] and left message for pt. to call. Vassie Moselle 02/04/2012

## 2012-02-04 NOTE — ED Notes (Signed)
Pt. called back. Pt. verified x 2 and given results. Pt. told that Dr. Lorenz Coaster had reviewed her labs on Fri. and e- prescribed a Rx. of Flagyl to her preferred pharmacy- [facility] on [address].  Pt. instructed to no alcohol while taking this medication. Pt. asked if it can be passed to her partner. I told her is is not an STD  Pt. 's questions about BV answered. Vassie Moselle 02/04/2012

## 2012-12-15 ENCOUNTER — Emergency Department (HOSPITAL_COMMUNITY)
Admission: EM | Admit: 2012-12-15 | Discharge: 2012-12-15 | Disposition: A | Discharge status: Home / Self Care | Payer: Managed Care, Other (non HMO) | Attending: Emergency Medicine | Admitting: Emergency Medicine

## 2012-12-15 ENCOUNTER — Emergency Department (HOSPITAL_COMMUNITY): Payer: Managed Care, Other (non HMO)

## 2012-12-15 ENCOUNTER — Encounter (HOSPITAL_COMMUNITY): Payer: Self-pay | Admitting: *Deleted

## 2012-12-15 DIAGNOSIS — Z8619 Personal history of other infectious and parasitic diseases: Secondary | ICD-10-CM | POA: Insufficient documentation

## 2012-12-15 DIAGNOSIS — Z3202 Encounter for pregnancy test, result negative: Secondary | ICD-10-CM | POA: Insufficient documentation

## 2012-12-15 DIAGNOSIS — R002 Palpitations: Secondary | ICD-10-CM | POA: Insufficient documentation

## 2012-12-15 DIAGNOSIS — R079 Chest pain, unspecified: Secondary | ICD-10-CM

## 2012-12-15 DIAGNOSIS — F17200 Nicotine dependence, unspecified, uncomplicated: Secondary | ICD-10-CM | POA: Insufficient documentation

## 2012-12-15 DIAGNOSIS — Z79899 Other long term (current) drug therapy: Secondary | ICD-10-CM | POA: Insufficient documentation

## 2012-12-15 DIAGNOSIS — R072 Precordial pain: Secondary | ICD-10-CM | POA: Insufficient documentation

## 2012-12-15 DIAGNOSIS — Z8659 Personal history of other mental and behavioral disorders: Secondary | ICD-10-CM | POA: Insufficient documentation

## 2012-12-15 LAB — COMPREHENSIVE METABOLIC PANEL
AST: 21 U/L (ref 0–37)
Albumin: 4.3 g/dL (ref 3.5–5.2)
Alkaline Phosphatase: 66 U/L (ref 39–117)
BUN: 9 mg/dL (ref 6–23)
Chloride: 101 mEq/L (ref 96–112)
Potassium: 3.6 mEq/L (ref 3.5–5.1)
Total Bilirubin: 0.4 mg/dL (ref 0.3–1.2)

## 2012-12-15 LAB — URINALYSIS, ROUTINE W REFLEX MICROSCOPIC
Glucose, UA: NEGATIVE mg/dL
Ketones, ur: NEGATIVE mg/dL
Leukocytes, UA: NEGATIVE
pH: 6.5 (ref 5.0–8.0)

## 2012-12-15 LAB — PREGNANCY, URINE: Preg Test, Ur: NEGATIVE

## 2012-12-15 MED ORDER — OMEPRAZOLE 20 MG PO CPDR: 20.0000 mg | DELAYED_RELEASE_CAPSULE | Freq: Every day | ORAL | Status: DC

## 2012-12-15 NOTE — ED Notes (Signed)
The pt has mid-chest pain with dizziness for 2 days no cold no sob.  A history of anxiety.  lmp jan 1st

## 2012-12-15 NOTE — ED Provider Notes (Signed)
History    This chart was scribed for Victoria Co, MD by Gerlean Ren, ED Scribe. This patient was seen in room TR08C/TR08C and the patient's care was started at 10:14 PM    CSN: [id number]  Arrival date & time 12/15/12  1928   First MD Initiated Contact with Patient 12/15/12 2157      Chief Complaint  Patient presents with  . Chest Pain     The history is provided by the patient. No language interpreter was used.  MEA OZGA is a 22 y.o. female who presents to the Emergency Department complaining of constant sternal chest pressure pain that began worsening earlier today with associated palpitations.  Pt believes symptoms are associated with h/o anxiety.  Pt denies dyspnea.  No h/o blood clots.  Pt reports that this pain has been constantly present for multiple months but that she has "gotten used to it."  Pt is a current everyday smoker. PCP is Dr. Chevis Pretty.  Past Medical History  Diagnosis Date  . Anxiety   . Depression   . Genital herpes   . HPV (human papilloma virus) anogenital infection     Past Surgical History  Procedure Laterality Date  . Back surgery  2011    scoliosis repair    No family history on file.  History  Substance Use Topics  . Smoking status: Current Every Day Smoker -- 4 years  . Smokeless tobacco: Not on file  . Alcohol Use: No    No OB history provided.   Review of Systems A complete 10 system review of systems was obtained and all systems are negative except as noted in the HPI and PMH.   Allergies  Review of patient's allergies indicates no known allergies.  Home Medications   Current Outpatient Rx  Name  Route  Sig  Dispense  Refill  . acetaminophen (TYLENOL) 325 MG tablet   Oral   Take 650 mg by mouth every 6 (six) hours as needed.         Marland Kitchen ibuprofen (ADVIL,MOTRIN) 200 MG tablet   Oral   Take 200 mg by mouth every 6 (six) hours as needed.         . naproxen (NAPROSYN) 375 MG tablet   Oral   Take 1 tablet (375 mg  total) by mouth 2 (two) times daily.   20 tablet   0   . norethindrone-ethinyl estradiol (NECON 0.5/35, 28,) 0.5-35 MG-MCG tablet   Oral   Take 1 tablet by mouth daily.   1 Package   11     BP 119/74  Pulse 103  Temp(Src) 98.6 F (37 C)  Resp 20  SpO2 100%  LMP 10/22/2012  Physical Exam  Nursing note and vitals reviewed. Constitutional: She is oriented to person, place, and time. She appears well-developed and well-nourished.  HENT:  Head: Normocephalic.  Eyes: EOM are normal.  Neck: Normal range of motion.  Cardiovascular: Normal rate, regular rhythm and normal heart sounds.   Pulmonary/Chest: Effort normal and breath sounds normal. No respiratory distress. She has no wheezes. She has no rales.  Abdominal: She exhibits no distension.  Musculoskeletal: Normal range of motion.  Neurological: She is alert and oriented to person, place, and time.  Psychiatric: She has a normal mood and affect.    ED Course  Procedures (including critical care time)   Date: 12/15/2012  Rate: 97  Rhythm: normal sinus rhythm  QRS Axis: normal  Intervals: normal  ST/T Wave abnormalities:  normal  Conduction Disutrbances: none  Narrative Interpretation:   Old EKG Reviewed: No prior EKG available      DIAGNOSTIC STUDIES: Oxygen Saturation is 100% on room air, normal by my interpretation.    COORDINATION OF CARE: 10:19 PM- Informed pt that blood work and XR are both negative and that there is no evidence of an emergent situation.  Advised Prilosec and follow-up with PCP.  Results for orders placed during the hospital encounter of 12/15/12  URINALYSIS, ROUTINE W REFLEX MICROSCOPIC      Result Value Range   Color, Urine YELLOW  YELLOW   APPearance CLEAR  CLEAR   Specific Gravity, Urine 1.015  1.005 - 1.030   pH 6.5  5.0 - 8.0   Glucose, UA NEGATIVE  NEGATIVE mg/dL   Hgb urine dipstick NEGATIVE  NEGATIVE   Bilirubin Urine NEGATIVE  NEGATIVE   Ketones, ur NEGATIVE  NEGATIVE mg/dL    Protein, ur NEGATIVE  NEGATIVE mg/dL   Urobilinogen, UA 0.2  0.0 - 1.0 mg/dL   Nitrite NEGATIVE  NEGATIVE   Leukocytes, UA NEGATIVE  NEGATIVE  PREGNANCY, URINE      Result Value Range   Preg Test, Ur NEGATIVE  NEGATIVE  COMPREHENSIVE METABOLIC PANEL      Result Value Range   Sodium 137  135 - 145 mEq/L   Potassium 3.6  3.5 - 5.1 mEq/L   Chloride 101  96 - 112 mEq/L   CO2 25  19 - 32 mEq/L   Glucose, Bld 89  70 - 99 mg/dL   BUN 9  6 - 23 mg/dL   Creatinine, Ser 1.30  0.50 - 1.10 mg/dL   Calcium 86.5  8.4 - 78.4 mg/dL   Total Protein 8.0  6.0 - 8.3 g/dL   Albumin 4.3  3.5 - 5.2 g/dL   AST 21  0 - 37 U/L   ALT 21  0 - 35 U/L   Alkaline Phosphatase 66  39 - 117 U/L   Total Bilirubin 0.4  0.3 - 1.2 mg/dL   GFR calc non Af Amer >90  >90 mL/min   GFR calc Af Amer >90  >90 mL/min  TROPONIN I      Result Value Range   Troponin I <0.30  <0.30 ng/mL    Dg Chest 2 View  12/15/2012  *RADIOLOGY REPORT*  Clinical Data: Central chest pain for 1 day, smoker, history back surgery  CHEST - 2 VIEW  Comparison: 03/10/2010  Findings: Normal heart size, mediastinal contours, and pulmonary vascularity. Lungs clear. No pleural effusion or pneumothorax. Interval significant reduction of scoliosis post spinal fixation.  IMPRESSION: No acute abnormalities.   Original Report Authenticated By: Ulyses Southward, M.D.    I personally reviewed the imaging tests through PACS system I reviewed available ER/hospitalization records through the EMR   1. Chest pain       MDM  The patient's chest pain has been chronic.  No shortness of breath.  The patient is PERC negative   I personally performed the services described in this documentation, which was scribed in my presence. The recorded information has been reviewed and is accurate.           Victoria Co, MD 12/15/12 2228

## 2012-12-15 NOTE — ED Notes (Signed)
Pt sts dealing with "anxiety chest pain" for years. Pt sts its relieved with sleep has tried zoloft in the past but it didn't help.

## 2012-12-19 ENCOUNTER — Emergency Department (HOSPITAL_COMMUNITY)
Admission: EM | Admit: 2012-12-19 | Discharge: 2012-12-19 | Disposition: A | Discharge status: Home / Self Care | Payer: Managed Care, Other (non HMO) | Attending: Emergency Medicine | Admitting: Emergency Medicine

## 2012-12-19 ENCOUNTER — Encounter (HOSPITAL_COMMUNITY): Payer: Self-pay | Admitting: *Deleted

## 2012-12-19 DIAGNOSIS — Z8659 Personal history of other mental and behavioral disorders: Secondary | ICD-10-CM | POA: Insufficient documentation

## 2012-12-19 DIAGNOSIS — Z8619 Personal history of other infectious and parasitic diseases: Secondary | ICD-10-CM | POA: Insufficient documentation

## 2012-12-19 DIAGNOSIS — R319 Hematuria, unspecified: Secondary | ICD-10-CM | POA: Insufficient documentation

## 2012-12-19 DIAGNOSIS — R300 Dysuria: Secondary | ICD-10-CM | POA: Insufficient documentation

## 2012-12-19 DIAGNOSIS — Z3202 Encounter for pregnancy test, result negative: Secondary | ICD-10-CM | POA: Insufficient documentation

## 2012-12-19 DIAGNOSIS — N390 Urinary tract infection, site not specified: Secondary | ICD-10-CM | POA: Insufficient documentation

## 2012-12-19 DIAGNOSIS — F17200 Nicotine dependence, unspecified, uncomplicated: Secondary | ICD-10-CM | POA: Insufficient documentation

## 2012-12-19 DIAGNOSIS — B379 Candidiasis, unspecified: Secondary | ICD-10-CM | POA: Insufficient documentation

## 2012-12-19 LAB — URINE MICROSCOPIC-ADD ON

## 2012-12-19 LAB — URINALYSIS, ROUTINE W REFLEX MICROSCOPIC
Bilirubin Urine: NEGATIVE
Specific Gravity, Urine: 1.021 (ref 1.005–1.030)
Urobilinogen, UA: 1 mg/dL (ref 0.0–1.0)
pH: 5.5 (ref 5.0–8.0)

## 2012-12-19 MED ORDER — FLUCONAZOLE 200 MG PO TABS: 200.0000 mg | ORAL_TABLET | Freq: Every day | ORAL | Status: AC

## 2012-12-19 MED ORDER — SULFAMETHOXAZOLE-TRIMETHOPRIM 800-160 MG PO TABS: 1.0000 | ORAL_TABLET | Freq: Two times a day (BID) | ORAL | Status: DC

## 2012-12-19 MED ORDER — PHENAZOPYRIDINE HCL 200 MG PO TABS: 200.0000 mg | ORAL_TABLET | Freq: Three times a day (TID) | ORAL | Status: DC

## 2012-12-19 NOTE — ED Provider Notes (Signed)
History     CSN: [id number]  Arrival date & time 12/19/12  0051   First MD Initiated Contact with Patient 12/19/12 0235      Chief Complaint  Patient presents with  . Urinary Frequency    (Consider location/radiation/quality/duration/timing/severity/associated sxs/prior treatment) HPI Pt to ER with complaints of hematuria, dysuria and urinary frequency that she noted yesterday night. She denies having any belly pain, back pain, vaginal bleeding, discharge, fevers, chills, N/V/D or weakness. She says that she has never gotten a UTI before that she knows of. nad vss   Past Medical History  Diagnosis Date  . Anxiety   . Depression   . Genital herpes   . HPV (human papilloma virus) anogenital infection     Past Surgical History  Procedure Laterality Date  . Back surgery  2011    scoliosis repair    No family history on file.  History  Substance Use Topics  . Smoking status: Current Every Day Smoker -- 4 years  . Smokeless tobacco: Not on file  . Alcohol Use: No    OB History   Grav Para Term Preterm Abortions TAB SAB Ect Mult Living                  Review of Systems  All other systems reviewed and are negative.    Allergies  Review of patient's allergies indicates no known allergies.  Home Medications   Current Outpatient Rx  Name  Route  Sig  Dispense  Refill  . ibuprofen (ADVIL,MOTRIN) 200 MG tablet   Oral   Take 600 mg by mouth every 6 (six) hours as needed for pain. For back pain         . omeprazole (PRILOSEC) 20 MG capsule   Oral   Take 1 capsule (20 mg total) by mouth daily.   30 capsule   0   . fluconazole (DIFLUCAN) 200 MG tablet   Oral   Take 1 tablet (200 mg total) by mouth daily.   7 tablet   0   . phenazopyridine (PYRIDIUM) 200 MG tablet   Oral   Take 1 tablet (200 mg total) by mouth 3 (three) times daily.   6 tablet   0   . sulfamethoxazole-trimethoprim (SEPTRA DS) 800-160 MG per tablet   Oral   Take 1 tablet by mouth  every 12 (twelve) hours.   10 tablet   0     BP 94/58  Pulse 67  Temp(Src) 98.2 F (36.8 C) (Oral)  Resp 20  SpO2 100%  LMP 10/22/2012  Physical Exam  Nursing note and vitals reviewed. Constitutional: She appears well-developed and well-nourished. No distress.  HENT:  Head: Normocephalic and atraumatic.  Eyes: Pupils are equal, round, and reactive to light.  Neck: Normal range of motion. Neck supple.  Cardiovascular: Normal rate and regular rhythm.   Pulmonary/Chest: Effort normal.  Abdominal: Soft.  Neurological: She is alert.  Skin: Skin is warm and dry.    ED Course  Procedures (including critical care time)  Labs Reviewed  URINALYSIS, ROUTINE W REFLEX MICROSCOPIC - Abnormal; Notable for the following:    APPearance CLOUDY (*)    Hgb urine dipstick LARGE (*)    Ketones, ur TRACE (*)    Protein, ur 100 (*)    Leukocytes, UA LARGE (*)    All other components within normal limits  URINE MICROSCOPIC-ADD ON - Abnormal; Notable for the following:    Squamous Epithelial / LPF FEW (*)  Bacteria, UA MANY (*)    All other components within normal limits  URINE CULTURE  PREGNANCY, URINE   No results found.   1. Yeast infection   2. UTI (lower urinary tract infection)       MDM  Vitals stable, exam and HPI not concerning for pyelo.   UTI plus yeast found in urine. Culture sent out. Diflucan, Pyridium and Bactrim Rx.   Referral back to her PCP. Return to ED precautions given.  Pt has been advised of the symptoms that warrant their return to the ED. Patient has voiced understanding and has agreed to follow-up with the PCP or specialist.         Dorthula Matas, PA 12/19/12 [phone]

## 2012-12-19 NOTE — ED Notes (Signed)
Pt c/o urinary frequency; hesitancy; hematuria

## 2012-12-19 NOTE — ED Notes (Signed)
Spoke with lab about urinalysis, stated it will be put in now

## 2012-12-19 NOTE — ED Provider Notes (Signed)
Medical screening examination/treatment/procedure(s) were performed by non-physician practitioner and as supervising physician I was immediately available for consultation/collaboration.   Amera Banos M Rodnisha Blomgren, MD 12/19/12 0629 

## 2012-12-19 NOTE — ED Notes (Signed)
Spoke with Lab about urinalysis not resulting.  Lab stated that the computer stated there was not an order, urine requitition sent with specimen.  New order put in at this time.

## 2012-12-21 LAB — URINE CULTURE: Colony Count: >=100000

## 2012-12-22 NOTE — ED Notes (Signed)
+  urine Patient treated with Septra-sensitive to same-chart appended per protocol MD.

## 2014-01-02 ENCOUNTER — Emergency Department: Payer: Self-pay | Admitting: Emergency Medicine

## 2015-12-09 ENCOUNTER — Emergency Department
Admission: EM | Admit: 2015-12-09 | Discharge: 2015-12-09 | Disposition: A | Discharge status: Home / Self Care | Payer: Managed Care, Other (non HMO) | Attending: Emergency Medicine | Admitting: Emergency Medicine

## 2015-12-09 ENCOUNTER — Encounter: Payer: Self-pay | Admitting: Emergency Medicine

## 2015-12-09 DIAGNOSIS — L02215 Cutaneous abscess of perineum: Secondary | ICD-10-CM | POA: Insufficient documentation

## 2015-12-09 DIAGNOSIS — Z79899 Other long term (current) drug therapy: Secondary | ICD-10-CM | POA: Insufficient documentation

## 2015-12-09 DIAGNOSIS — N739 Female pelvic inflammatory disease, unspecified: Secondary | ICD-10-CM

## 2015-12-09 DIAGNOSIS — F17200 Nicotine dependence, unspecified, uncomplicated: Secondary | ICD-10-CM | POA: Insufficient documentation

## 2015-12-09 MED ORDER — IBUPROFEN 600 MG PO TABS: 600.0000 mg | ORAL_TABLET | Freq: Four times a day (QID) | ORAL | Status: DC | PRN

## 2015-12-09 MED ORDER — LIDOCAINE HCL (PF) 1 % IJ SOLN
5.0000 mL | Freq: Once | INTRAMUSCULAR | Status: DC
  Filled 2015-12-09: qty 5

## 2015-12-09 MED ORDER — SULFAMETHOXAZOLE-TRIMETHOPRIM 800-160 MG PO TABS: 1.0000 | ORAL_TABLET | Freq: Two times a day (BID) | ORAL | Status: DC

## 2015-12-09 NOTE — ED Notes (Signed)
Developed small red raised area to pubic area couple of days ago

## 2015-12-09 NOTE — ED Provider Notes (Signed)
[facility] Emergency Department Provider Note  ____________________________________________  Time seen: Approximately 10:23 AM  I have reviewed the triage vital signs and the nursing notes.   HISTORY  Chief Complaint Abscess    HPI Victoria [name] is a 25 y.o. female presents to the emergency department for an abscess located on the mons pubis. She reports a history of having abscesses that typically resolve on their own. She has required antibiotics for this in the past. The patient believes her current abscess may be a result of shaving or increased sweat/heat in the area. She has had this abscess for the past week and it is progressively worsening. She now experiences pain with turning over on her side and with walking. There is slight radiation of pain to the right thigh. She has not taken any medication for the pain.  She denies fever, chills, night sweats, nausea and vomiting.   Past Medical History  Diagnosis Date  . Anxiety   . Depression   . Genital herpes   . HPV (human papilloma virus) anogenital infection     There are no active problems to display for this patient.   Past Surgical History  Procedure Laterality Date  . Back surgery  2011    scoliosis repair    Current Outpatient Rx  Name  Route  Sig  Dispense  Refill  . QUEtiapine (SEROQUEL) 100 MG tablet   Oral   Take 100 mg by mouth at bedtime.         Marland Kitchen ibuprofen (ADVIL,MOTRIN) 600 MG tablet   Oral   Take 1 tablet (600 mg total) by mouth every 6 (six) hours as needed.   30 tablet   0   . omeprazole (PRILOSEC) 20 MG capsule   Oral   Take 1 capsule (20 mg total) by mouth daily.   30 capsule   0   . phenazopyridine (PYRIDIUM) 200 MG tablet   Oral   Take 1 tablet (200 mg total) by mouth 3 (three) times daily.   6 tablet   0   . sulfamethoxazole-trimethoprim (BACTRIM DS,SEPTRA DS) 800-160 MG tablet   Oral   Take 1 tablet by mouth 2 (two) times daily.   14 tablet    0     Allergies Review of patient's allergies indicates no known allergies.  History reviewed. No pertinent family history.  Social History Social History  Substance Use Topics  . Smoking status: Current Every Day Smoker -- 4 years  . Smokeless tobacco: None  . Alcohol Use: No     Review of Systems  Constitutional: No fever/chills, fatigue.  Cardiovascular: No chest pain. Respiratory: No shortness of breath. No wheezing.  Gastrointestinal: No abdominal pain.  No nausea, vomiting.   Genitourinary: Negative for discharge.  Musculoskeletal: Positive upper right thigh pain Skin: Positive skin lesion mons pubis. No oozing or weeping. Negative for rash. Neurological: Negative for headaches, focal weakness or numbness. 10-point ROS otherwise negative.  ____________________________________________   PHYSICAL EXAM:  VITAL SIGNS: ED Triage Vitals  Enc Vitals Group     BP 12/09/15 0931 126/69 mmHg     Pulse Rate 12/09/15 0931 113     Resp 12/09/15 0931 18     Temp 12/09/15 0931 98.2 F (36.8 C)     Temp Source 12/09/15 0931 Oral     SpO2 12/09/15 0931 100 %     Weight 12/09/15 0931 150 lb (68.04 kg)     Height 12/09/15 0931  (1.626  m)     Head Cir --      Peak Flow --      Pain Score 12/09/15 0931 7     Pain Loc --      Pain Edu? --      Excl. in GC? --      Constitutional: Alert and oriented. Well appearing and in no acute distress. Head: Atraumatic. Musculoskeletal: Slight upper right thigh tenderness to palpation near abscess Neurologic:  Normal speech and language. No gross focal neurologic deficits are appreciated.  Skin:  Abscess on mons pubis, indurated 3.5cm surrounding. Area is tender to palpation. No oozing or weeping.  Psychiatric: Mood and affect are normal. Speech and behavior are normal. Patient exhibits appropriate insight and judgement.   ____________________________________________    LABS  None  ____________________________________________  EKG  None ____________________________________________  RADIOLOGY  None ____________________________________________    PROCEDURES  Procedure(s) performed: INCISION AND DRAINAGE Performed by: Randa Lynn, PA-S under the direct supervision of Tye Savoy, PA-C Consent: Verbal consent obtained. Risks and benefits: risks, benefits and alternatives were discussed Type: abscess  Body area: Mons pubis, right  Anesthesia: local infiltration  Incision was made with a scalpel.  Local anesthetic: lidocaine 1% without epinephrine  Anesthetic total: 4 ml  Complexity: complex Blunt dissection to break up loculations  Drainage: purulent  Drainage amount: 1mL  Packing material: 1/4 in iodoform gauze  Patient tolerance: Patient tolerated the procedure well with no immediate complications.        Medications  lidocaine (PF) (XYLOCAINE) 1 % injection 5 mL (not administered)     ____________________________________________   INITIAL IMPRESSION / ASSESSMENT AND PLAN / ED COURSE  Patient's diagnosis is consistent with abscess female genitalia. Patient will be discharged home with prescriptions for Bactrim DS to take one tablet twice daily for 7 days as well as ibuprofen 600 mg take one tablet by mouth every 6 hours as needed for pain and inflammation. Patient also given a work note to be excused today and tomorrow. Patient is to [facility] for wound recheck or may return to this emergency department.  Patient is given ED precautions to return to the ED for any worsening or new symptoms.    Patient's evaluation and treatment completed by Randa Lynn, PA-S under my direct supervision. I agree with the ED course as documented in this chart, on this date of service.   ____________________________________________  FINAL CLINICAL IMPRESSION(S) / ED DIAGNOSES  Final diagnoses:   Abscess of female genitalia      NEW MEDICATIONS STARTED DURING THIS VISIT:  New Prescriptions   IBUPROFEN (ADVIL,MOTRIN) 600 MG TABLET    Take 1 tablet (600 mg total) by mouth every 6 (six) hours as needed.   SULFAMETHOXAZOLE-TRIMETHOPRIM (BACTRIM DS,SEPTRA DS) 800-160 MG TABLET    Take 1 tablet by mouth 2 (two) times daily.         Hope Pigeon, PA-C 12/09/15 1133  Emily Filbert, MD 12/09/15 1320

## 2015-12-09 NOTE — Discharge Instructions (Signed)
Incision and Drainage, Care After Refer to this sheet in the next few weeks. These instructions provide you with information on caring for yourself after your procedure. Your caregiver may also give you more specific instructions. Your treatment has been planned according to current medical practices, but problems sometimes occur. Call your caregiver if you have any problems or questions after your procedure. HOME CARE INSTRUCTIONS   If antibiotic medicine is given, take it as directed. Finish it even if you start to feel better.  Only take over-the-counter or prescription medicines for pain, discomfort, or fever as directed by your caregiver.  Keep all follow-up appointments as directed by your caregiver.  Change any bandages (dressings) as directed by your caregiver. Replace old dressings with clean dressings.  Wash your hands before and after caring for your wound. You will receive specific instructions for cleansing and caring for your wound.  SEEK MEDICAL CARE IF:   You have increased pain, swelling, or redness around the wound.  You have increased drainage, smell, or bleeding from the wound.  You have muscle aches, chills, or you feel generally sick.  You have a fever. MAKE SURE YOU:   Understand these instructions.  Will watch your condition.  Will get help right away if you are not doing well or get worse.   This information is not intended to replace advice given to you by your health care provider. Make sure you discuss any questions you have with your health care provider.   Document Released: 12/31/2011 Document Revised: 10/29/2014 Document Reviewed: 12/31/2011 Elsevier Interactive Patient Education 2016 [employer] Inc[employer]  

## 2015-12-14 ENCOUNTER — Emergency Department
Admission: EM | Admit: 2015-12-14 | Discharge: 2015-12-14 | Disposition: A | Discharge status: Home / Self Care | Payer: Managed Care, Other (non HMO) | Attending: Emergency Medicine | Admitting: Emergency Medicine

## 2015-12-14 DIAGNOSIS — Z79899 Other long term (current) drug therapy: Secondary | ICD-10-CM | POA: Insufficient documentation

## 2015-12-14 DIAGNOSIS — Z792 Long term (current) use of antibiotics: Secondary | ICD-10-CM | POA: Insufficient documentation

## 2015-12-14 DIAGNOSIS — N764 Abscess of vulva: Secondary | ICD-10-CM | POA: Insufficient documentation

## 2015-12-14 DIAGNOSIS — F17200 Nicotine dependence, unspecified, uncomplicated: Secondary | ICD-10-CM | POA: Insufficient documentation

## 2015-12-14 MED ORDER — CLINDAMYCIN HCL 150 MG PO CAPS: 150.0000 mg | ORAL_CAPSULE | Freq: Four times a day (QID) | ORAL | Status: DC

## 2015-12-14 NOTE — ED Notes (Deleted)
Pt arrives to ER via POV c/o sob X 2 weeks. Pt seen in ER yesterday and left AMA per report. PT called back and told that her blood test was positive for blood clot and she needed to return for further evaluation. Pt alert and oriented X4, active, cooperative, pt in NAD. RR even and unlabored, color WNL.

## 2015-12-14 NOTE — ED Notes (Signed)
Pt c/o abscess to the perineal area since Monday..states she is currently on abx for recent abscess

## 2015-12-14 NOTE — Discharge Instructions (Signed)
Continue present antibiotic and start clindamycin as directed. Follow discharge care instructions. Abscess An abscess (boil or furuncle) is an infected area on or under the skin. This area is filled with yellowish-white fluid (pus) and other material (debris). HOME CARE   Only take medicines as told by your doctor.  If you were given antibiotic medicine, take it as directed. Finish the medicine even if you start to feel better.  If gauze is used, follow your doctor's directions for changing the gauze.  To avoid spreading the infection:  Keep your abscess covered with a bandage.  Wash your hands well.  Do not share personal care items, towels, or whirlpools with others.  Avoid skin contact with others.  Keep your skin and clothes clean around the abscess.  Keep all doctor visits as told. GET HELP RIGHT AWAY IF:   You have more pain, puffiness (swelling), or redness in the wound site.  You have more fluid or blood coming from the wound site.  You have muscle aches, chills, or you feel sick.  You have a fever. MAKE SURE YOU:   Understand these instructions.  Will watch your condition.  Will get help right away if you are not doing well or get worse.   This information is not intended to replace advice given to you by your health care provider. Make sure you discuss any questions you have with your health care provider.   Document Released: 03/26/2008 Document Revised: 04/08/2012 Document Reviewed: 12/22/2011 Elsevier Interactive Patient Education [employer]

## 2015-12-14 NOTE — ED Notes (Signed)
Pt states she had an abscess lanced on Friday on the right side of her vagina, states she is on abx (Bactrium), now states an abscess on the left side of her vagina

## 2015-12-14 NOTE — ED Provider Notes (Signed)
[facility] Emergency Department Provider Note  ____________________________________________  Time seen: Approximately 11:40 AM  I have reviewed the triage vital signs and the nursing notes.   HISTORY  Chief Complaint Abscess    HPI Victoria [name] is a 25 y.o. female presenting with abscess on left mons pubis.  Approximately 1.5 cm firm nodule that is tender to palpation.  Patient complains of pain with movement.  Patient also explained that she did not go to follow-up at clinic for previous I&D of abscess on left mons pubis.  follow-up due to co-pay of $75. States the wound packing fell out the following day when she took a shower.  Denies fever, chills, red streakiness, oozing, or discharge.  Patient has been taking previous antibiotics as prescribed.    Past Medical History  Diagnosis Date  . Anxiety   . Depression   . Genital herpes   . HPV (human papilloma virus) anogenital infection     There are no active problems to display for this patient.   Past Surgical History  Procedure Laterality Date  . Back surgery  2011    scoliosis repair    Current Outpatient Rx  Name  Route  Sig  Dispense  Refill  . clindamycin (CLEOCIN) 150 MG capsule   Oral   Take 1 capsule (150 mg total) by mouth 4 (four) times daily.   40 capsule   0   . ibuprofen (ADVIL,MOTRIN) 600 MG tablet   Oral   Take 1 tablet (600 mg total) by mouth every 6 (six) hours as needed.   30 tablet   0   . omeprazole (PRILOSEC) 20 MG capsule   Oral   Take 1 capsule (20 mg total) by mouth daily.   30 capsule   0   . phenazopyridine (PYRIDIUM) 200 MG tablet   Oral   Take 1 tablet (200 mg total) by mouth 3 (three) times daily.   6 tablet   0   . QUEtiapine (SEROQUEL) 100 MG tablet   Oral   Take 100 mg by mouth at bedtime.         . sulfamethoxazole-trimethoprim (BACTRIM DS,SEPTRA DS) 800-160 MG tablet   Oral   Take 1 tablet by mouth 2 (two) times daily.   14 tablet   0     Allergies Review of patient's allergies indicates no known allergies.  No family history on file.  Social History Social History  Substance Use Topics  . Smoking status: Current Every Day Smoker -- 4 years  . Smokeless tobacco: None  . Alcohol Use: No    Review of Systems Constitutional: No fever/chills Eyes: No visual changes. ENT: No sore throat. Cardiovascular: Denies chest pain. Respiratory: Denies shortness of breath. Gastrointestinal: No abdominal pain.  No nausea, no vomiting.  No diarrhea.  No constipation. Genitourinary: Negative for dysuria. Positive for tender nodule on left groin.   Musculoskeletal: Negative for back pain. Skin: Negative for rash. Neurological: Negative for headaches, focal weakness or numbness.   ____________________________________________   PHYSICAL EXAM:  VITAL SIGNS: ED Triage Vitals  Enc Vitals Group     BP 12/14/15 1107 135/111 mmHg     Pulse Rate 12/14/15 1107 111     Resp 12/14/15 1107 18     Temp 12/14/15 1107 97.7 F (36.5 C)     Temp Source 12/14/15 1107 Oral     SpO2 12/14/15 1107 100 %     Weight 12/14/15 1107 147 lb (66.679 kg)  Height 12/14/15 1107  (1.626 m)     Head Cir --      Peak Flow --      Pain Score 12/14/15 1108 6     Pain Loc --      Pain Edu? --      Excl. in GC? --     Constitutional: Alert and oriented. Well appearing and in no acute distress. Eyes: Conjunctivae are normal.  Respiratory: Normal respiratory effort.   Gastrointestinal: Soft and nontender. No distention.  Genitourinary: Tender, firm 1.5 cm abscess on left mons pubis. Right mons pubis: .5 cm healing wound site. Absent of discharge, streaking or erythema.  Neurologic:  Normal speech and language. No gross focal neurologic deficits are appreciated.  Skin:  Skin is warm, dry and intact. No rash noted.   ____________________________________________   LABS (all labs ordered are listed, but only abnormal results are  displayed)  Labs Reviewed - No data to display ____________________________________________  EKG   ____________________________________________  RADIOLOGY   ____________________________________________   PROCEDURES  Procedure(s) performed: None  Critical Care performed: No  ____________________________________________   INITIAL IMPRESSION / ASSESSMENT AND PLAN / ED COURSE  Pertinent labs & imaging results that were available during my care of the patient were reviewed by me and considered in my medical decision making (see chart for details).  Patient present with abscess on left mons pubis. Abscess is tender to palpation. Patient is on Septra for prior I&D of abscess on right mons pubis on 2/17.  Adding Clindamycin to antibiotic regimen. Gave patient antibacterial soap for cleansing the area  Discussed warning signs of infection and when to return to clinic for possible incision and drainage if abscess does not resolve on antibiotics.   Discussed potential abscess triggers and how to avoid in the future.  ____________________________________________   FINAL CLINICAL IMPRESSION(S) / ED DIAGNOSES  Final diagnoses:  Abscess of labia      Joni Reining, PA-C 12/14/15 1452  Darci Current, MD 12/14/15 [phone]

## 2016-11-22 ENCOUNTER — Encounter: Payer: Self-pay | Admitting: Emergency Medicine

## 2016-11-22 ENCOUNTER — Emergency Department
Admission: EM | Admit: 2016-11-22 | Discharge: 2016-11-22 | Disposition: A | Discharge status: Home / Self Care | Payer: Managed Care, Other (non HMO) | Attending: Emergency Medicine | Admitting: Emergency Medicine

## 2016-11-22 DIAGNOSIS — R1033 Periumbilical pain: Secondary | ICD-10-CM | POA: Insufficient documentation

## 2016-11-22 LAB — CBC
HEMATOCRIT: 43.1 % (ref 35.0–47.0)
Hemoglobin: 14.3 g/dL (ref 12.0–16.0)
MCH: 27.7 pg (ref 26.0–34.0)
MCHC: 33.2 g/dL (ref 32.0–36.0)
MCV: 83.3 fL (ref 80.0–100.0)
Platelets: 251 10*3/uL (ref 150–440)
RBC: 5.18 MIL/uL (ref 3.80–5.20)
RDW: 13.2 % (ref 11.5–14.5)
WBC: 6.3 10*3/uL (ref 3.6–11.0)

## 2016-11-22 LAB — COMPREHENSIVE METABOLIC PANEL
ALT: 39 U/L (ref 14–54)
AST: 27 U/L (ref 15–41)
Albumin: 4.1 g/dL (ref 3.5–5.0)
Alkaline Phosphatase: 57 U/L (ref 38–126)
Anion gap: 6 (ref 5–15)
BILIRUBIN TOTAL: 0.6 mg/dL (ref 0.3–1.2)
BUN: 10 mg/dL (ref 6–20)
CO2: 27 mmol/L (ref 22–32)
Calcium: 9.1 mg/dL (ref 8.9–10.3)
Chloride: 105 mmol/L (ref 101–111)
Creatinine, Ser: 0.77 mg/dL (ref 0.44–1.00)
Glucose, Bld: 91 mg/dL (ref 65–99)
POTASSIUM: 4.1 mmol/L (ref 3.5–5.1)
Sodium: 138 mmol/L (ref 135–145)
Total Protein: 7.2 g/dL (ref 6.5–8.1)

## 2016-11-22 LAB — URINALYSIS, COMPLETE (UACMP) WITH MICROSCOPIC
BILIRUBIN URINE: NEGATIVE
Glucose, UA: NEGATIVE mg/dL
HGB URINE DIPSTICK: NEGATIVE
Ketones, ur: NEGATIVE mg/dL
LEUKOCYTES UA: NEGATIVE
NITRITE: NEGATIVE
PH: 7 (ref 5.0–8.0)
Protein, ur: NEGATIVE mg/dL
SPECIFIC GRAVITY, URINE: 1.009 (ref 1.005–1.030)

## 2016-11-22 LAB — POCT PREGNANCY, URINE: PREG TEST UR: NEGATIVE

## 2016-11-22 LAB — LIPASE, BLOOD: LIPASE: 28 U/L (ref 11–51)

## 2016-11-22 NOTE — ED Triage Notes (Signed)
Pt to ED from home c/o generalized abd pain since yesterday.  Denies n/v/d, denies urinary symptoms.  States worse when bending over.  States unsure if pregnant, on BC.

## 2016-11-22 NOTE — ED Notes (Signed)
AAOx3.  Skin warm and dry. NAD.  Ambulates with easy and steady gait. NAD °

## 2016-11-22 NOTE — ED Provider Notes (Signed)
[facility] Emergency Department Provider Note  ____________________________________________   First MD Initiated Contact with Patient 11/22/16 1529     (approximate)  I have reviewed the triage vital signs and the nursing notes.   HISTORY  Chief Complaint Abdominal Pain   HPI Victoria [name] is a 26 y.o. female who is presenting to the emergency department today with periumbilical abdominal pain that started yesterday work. She says that she was very upset when the pain started and that it has a maximum intensity of a 7 out of 10. She denies any nausea vomiting or diarrhea. Denies any radiation of the pain. Says that she is a history of ovarian cysts but this feels different than her ovarian cysts. Says that her cysts usually caused a much lower down into her pelvis and she is not having neck quality of pain at this time. Denies any vaginal discharge or bleeding. Does not report any concern for sexually transmitted diseases. Says that she may have "pulled something" but does not remember particular instance of overexerting herself or any heavy lifting. Says that she is seated most of the time at her job. She says that she is also concerned about a hernia. No history of abdominal surgeries.Patient says that the pain can come and go and last for up to now her to time but denies any pain at this time.   Past Medical History:  Diagnosis Date  . Anxiety   . Depression   . Genital herpes   . HPV (human papilloma virus) anogenital infection     There are no active problems to display for this patient.   Past Surgical History:  Procedure Laterality Date  . BACK SURGERY  2011   scoliosis repair  . DG SCOLIOSIS SERIES ([facility] HX)      Prior to Admission medications   Medication Sig Start Date End Date Taking? Authorizing Provider  clindamycin (CLEOCIN) 150 MG capsule Take 1 capsule (150 mg total) by mouth 4 (four) times daily. 12/14/15   Joni Reiningonald K Smith, PA-C    ibuprofen (ADVIL,MOTRIN) 600 MG tablet Take 1 tablet (600 mg total) by mouth every 6 (six) hours as needed. 12/09/15   Jami L Hagler, PA-C  omeprazole (PRILOSEC) 20 MG capsule Take 1 capsule (20 mg total) by mouth daily. 12/15/12   Azalia BilisKevin Campos, MD  phenazopyridine (PYRIDIUM) 200 MG tablet Take 1 tablet (200 mg total) by mouth 3 (three) times daily. 12/19/12   Tiffany Neva SeatGreene, PA-C  QUEtiapine (SEROQUEL) 100 MG tablet Take 100 mg by mouth at bedtime.    Historical Provider, MD  sulfamethoxazole-trimethoprim (BACTRIM DS,SEPTRA DS) 800-160 MG tablet Take 1 tablet by mouth 2 (two) times daily. 12/09/15   Jami L Hagler, PA-C    Allergies Patient has no known allergies.  History reviewed. No pertinent family history.  Social History Social History  Substance Use Topics  . Smoking status: Former Smoker    Years: 4.00  . Smokeless tobacco: Never Used  . Alcohol use Yes     Comment: weekend    Review of Systems Constitutional: No fever/chills Eyes: No visual changes. ENT: No sore throat. Cardiovascular: Denies chest pain. Respiratory: Denies shortness of breath. Gastrointestinal:   No nausea, no vomiting.  No diarrhea.  No constipation. Genitourinary: Negative for dysuria. Musculoskeletal: Negative for back pain. Skin: Negative for rash. Neurological: Negative for headaches, focal weakness or numbness.  10-point ROS otherwise negative.  ____________________________________________   PHYSICAL EXAM:  VITAL SIGNS: ED Triage Vitals  Enc Vitals Group     BP 11/22/16 1418 123/75     Pulse Rate 11/22/16 1418 (!) 110     Resp 11/22/16 1418 16     Temp 11/22/16 1418 98.3 F (36.8 C)     Temp Source 11/22/16 1418 Oral     SpO2 11/22/16 1418 100 %     Weight 11/22/16 1419 143 lb (64.9 kg)     Height 11/22/16 1419 5\' 4"  (1.626 m)     Head Circumference --      Peak Flow --      Pain Score 11/22/16 1419 7     Pain Loc --      Pain Edu? --      Excl. in GC? --     Constitutional:  Alert and oriented. Well appearing and in no acute distress. Eyes: Conjunctivae are normal. PERRL. EOMI. Head: Atraumatic. Nose: No congestion/rhinnorhea. Mouth/Throat: Mucous membranes are moist.   Neck: No stridor.   Cardiovascular: Normal rate, regular rhythm. Grossly normal heart sounds.  Good peripheral circulation. Respiratory: Normal respiratory effort.  No retractions. Lungs CTAB. Gastrointestinal: Soft and nontender. No distention. No hernia sac palpated. Patient standing without any hernia bulge or sac palpated. Musculoskeletal: No lower extremity tenderness nor edema.  No joint effusions. Neurologic:  Normal speech and language. No gross focal neurologic deficits are appreciated. No gait instability. Skin:  Skin is warm, dry and intact. No rash noted. Psychiatric: Mood and affect are normal. Speech and behavior are normal.  ____________________________________________   LABS (all labs ordered are listed, but only abnormal results are displayed)  Labs Reviewed  URINALYSIS, COMPLETE (UACMP) WITH MICROSCOPIC - Abnormal; Notable for the following:       Result Value   Color, Urine STRAW (*)    APPearance CLEAR (*)    Bacteria, UA RARE (*)    Squamous Epithelial / LPF 0-5 (*)    All other components within normal limits  LIPASE, BLOOD  COMPREHENSIVE METABOLIC PANEL  CBC  POCT PREGNANCY, URINE  POC URINE PREG, ED   ____________________________________________  EKG   ____________________________________________  RADIOLOGY   ____________________________________________   PROCEDURES  Procedure(s) performed:   Procedures  Critical Care performed:   ____________________________________________   INITIAL IMPRESSION / ASSESSMENT AND PLAN / ED COURSE  Pertinent labs & imaging results that were available during my care of the patient were reviewed by me and considered in my medical decision making (see chart for details).  Unclear cause of the patient's pain.  She also says that she is not aware of anyone in her family having ulcerative colitis or Crohn's disease. I do not see reason to further workup at this time. Her pain is relieved and she is nontender to palpation. I gave her return precautions to come back for any worsening or concerning symptoms. I pointed believe that if the pain was worsening that imaging may be merited such as a CAT scan or an ultrasound of the pelvis. Very reassuring lab workup. Her heart rate was 101 bpm in the room. 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 She will be following up in the office for continued care of these conditions.       ____________________________________________   FINAL CLINICAL IMPRESSION(S) / ED DIAGNOSES  Abdominal pain.    NEW MEDICATIONS STARTED DURING THIS VISIT:  New Prescriptions   No medications on file  Note:  This document was prepared using Dragon voice recognition software and may include unintentional dictation errors.    Myrna Blazer, MD 11/22/16 [phone]

## 2017-01-15 ENCOUNTER — Emergency Department
Admission: EM | Admit: 2017-01-15 | Discharge: 2017-01-15 | Disposition: A | Discharge status: Home / Self Care | Payer: Managed Care, Other (non HMO) | Attending: Emergency Medicine | Admitting: Emergency Medicine

## 2017-01-15 DIAGNOSIS — Z87891 Personal history of nicotine dependence: Secondary | ICD-10-CM | POA: Insufficient documentation

## 2017-01-15 DIAGNOSIS — H6122 Impacted cerumen, left ear: Secondary | ICD-10-CM

## 2017-01-15 DIAGNOSIS — H6123 Impacted cerumen, bilateral: Secondary | ICD-10-CM | POA: Insufficient documentation

## 2017-01-15 DIAGNOSIS — H6121 Impacted cerumen, right ear: Secondary | ICD-10-CM

## 2017-01-15 MED ORDER — CARBAMIDE PEROXIDE 6.5 % OT SOLN
5.0000 [drp] | Freq: Two times a day (BID) | OTIC | 0 refills | Status: DC
Start: 2017-01-15 — End: 2017-07-11

## 2017-01-15 NOTE — Discharge Instructions (Signed)
Continue using eardrops as directed. After 3 days use bulb syringe and warm water to irrigate your ears. If you  continued to have problems your need to see [facility] Dr. Jenne CampusMcQueen. You need to call and make an appointment. You may also take Tylenol as needed for ear pain. Do not use Q-tips to clean your ears.

## 2017-01-15 NOTE — ED Notes (Signed)
Presents with left ear pain for several weeks  Unsure of fever  But states pain has increased and she noticed some swelling from ear into jaw area

## 2017-01-15 NOTE — ED Triage Notes (Signed)
Pt presents with earache.

## 2017-01-15 NOTE — ED Provider Notes (Signed)
[facility] Emergency Department Provider Note  ____________________________________________   First MD Initiated Contact with Patient 01/15/17 [phone]     (approximate)  I have reviewed the triage vital signs and the nursing notes.   HISTORY  Chief Complaint Otalgia    HPI Victoria [name] is a 26 y.o. female is here with complaint of left ear pain for 3 weeks. Patient is unaware of any fever. She denies any upper respiratory symptoms. She states that she's had some pain radiating from her ear down into her jaw. She states that she wears earplugs at work and "no my ears are dirty". Patient has not been seen prior to this visit. She has not been taking any over-the-counter medication for pain. Patient continues to smokes 3 cigarettes per day.   Past Medical History:  Diagnosis Date  . Anxiety   . Depression   . Genital herpes   . HPV (human papilloma virus) anogenital infection     There are no active problems to display for this patient.   Past Surgical History:  Procedure Laterality Date  . BACK SURGERY  2011   scoliosis repair  . DG SCOLIOSIS SERIES ([facility] HX)      Prior to Admission medications   Medication Sig Start Date End Date Taking? Authorizing Provider  carbamide peroxide (DEBROX) 6.5 % otic solution Place 5 drops into both ears 2 (two) times daily. 01/15/17 01/15/18  Tommi Rumpshonda L Diesha Rostad, PA-C  QUEtiapine (SEROQUEL) 100 MG tablet Take 100 mg by mouth at bedtime.    Historical Provider, MD    Allergies Patient has no known allergies.  No family history on file.  Social History Social History  Substance Use Topics  . Smoking status: Former Smoker    Years: 4.00  . Smokeless tobacco: Never Used  . Alcohol use Yes     Comment: weekend    Review of Systems Constitutional: No fever/chills Eyes: No visual changes. ENT: No sore throat.Positive left ear pain. Cardiovascular: Denies chest pain. Respiratory: Denies shortness of  breath. Gastrointestinal:  No nausea, no vomiting.   Skin: Negative for rash. Neurological: Negative for headaches  10-point ROS otherwise negative.  ____________________________________________   PHYSICAL EXAM:  VITAL SIGNS: ED Triage Vitals  Enc Vitals Group     BP 01/15/17 0857 139/84     Pulse Rate 01/15/17 0857 94     Resp 01/15/17 0857 18     Temp 01/15/17 0857 98.8 F (37.1 C)     Temp Source 01/15/17 0857 Oral     SpO2 01/15/17 0857 100 %     Weight 01/15/17 0858 148 lb (67.1 kg)     Height 01/15/17 0858 5\' 4"  (1.626 m)     Head Circumference --      Peak Flow --      Pain Score --      Pain Loc --      Pain Edu? --      Excl. in GC? --     Constitutional: Alert and oriented. Well appearing and in no acute distress. Eyes: Conjunctivae are normal. PERRL. EOMI. Head: Atraumatic. Nose: No congestion/rhinnorhea.   Right EAC moderate cerumen impaction, TM is dull. Left EAC with cerumen impaction. There is no exudate and no tenderness on palpation of the tragus.   Mouth/Throat: Mucous membranes are moist.  Oropharynx non-erythematous. Neck: No stridor.   Hematological/Lymphatic/Immunilogical: No cervical lymphadenopathy. Cardiovascular: Normal rate, regular rhythm. Grossly normal heart sounds.  Good peripheral circulation. Respiratory: Normal respiratory  effort.  No retractions. Lungs CTAB. Musculoskeletal: Moves upper and lower extremities without any difficulty. Normal gait was noted. Neurologic:  Normal speech and language. No gross focal neurologic deficits are appreciated. No gait instability. Skin:  Skin is warm, dry and intact. No rash noted. Psychiatric: Mood and affect are normal. Speech and behavior are normal.  ____________________________________________   LABS (all labs ordered are listed, but only abnormal results are displayed)  Labs Reviewed - No data to display   PROCEDURES  Procedure(s) performed: None  Procedures  Critical Care  performed: No  ____________________________________________   INITIAL IMPRESSION / ASSESSMENT AND PLAN / ED COURSE  Pertinent labs & imaging results that were available during my care of the patient were reviewed by me and considered in my medical decision making (see chart for details).  She here with complaint of left ear pain for 3 weeks. Patient uses earplugs at work. She denies any fever or upper respiratory symptoms. On exam there is cerumen impaction complete on the left EAC. Right EAC is partially occluded. Patient was given prescription for Debrox along with instructions to lavage the ear. She is to follow-up with [facility] if any continued problems.      ____________________________________________   FINAL CLINICAL IMPRESSION(S) / ED DIAGNOSES  Final diagnoses:  Impacted cerumen of left ear  Impacted cerumen, right ear      NEW MEDICATIONS STARTED DURING THIS VISIT:  New Prescriptions   CARBAMIDE PEROXIDE (DEBROX) 6.5 % OTIC SOLUTION    Place 5 drops into both ears 2 (two) times daily.     Note:  This document was prepared using Dragon voice recognition software and may include unintentional dictation errors.    Tommi Rumps, PA-C 01/15/17 8295    Emily Filbert, MD 01/15/17 1030

## 2017-06-22 DIAGNOSIS — I10 Essential (primary) hypertension: Secondary | ICD-10-CM

## 2017-06-22 HISTORY — DX: Essential (primary) hypertension: I10

## 2017-07-08 ENCOUNTER — Emergency Department
Admission: EM | Admit: 2017-07-08 | Discharge: 2017-07-08 | Disposition: A | Discharge status: Other Acute Inpatient Hospital | Payer: Managed Care, Other (non HMO) | Attending: Emergency Medicine | Admitting: Emergency Medicine

## 2017-07-08 ENCOUNTER — Inpatient Hospital Stay
Admission: AD | Admit: 2017-07-08 | Discharge: 2017-07-11 | DRG: 885 | Disposition: A | Discharge status: Home / Self Care | Payer: No Typology Code available for payment source | Source: Intra-hospital | Attending: Psychiatry | Admitting: Psychiatry

## 2017-07-08 DIAGNOSIS — Z87891 Personal history of nicotine dependence: Secondary | ICD-10-CM | POA: Insufficient documentation

## 2017-07-08 DIAGNOSIS — F1020 Alcohol dependence, uncomplicated: Secondary | ICD-10-CM | POA: Diagnosis present

## 2017-07-08 DIAGNOSIS — F4310 Post-traumatic stress disorder, unspecified: Secondary | ICD-10-CM | POA: Diagnosis present

## 2017-07-08 DIAGNOSIS — Z91128 Patient's intentional underdosing of medication regimen for other reason: Secondary | ICD-10-CM | POA: Diagnosis not present

## 2017-07-08 DIAGNOSIS — T43596A Underdosing of other antipsychotics and neuroleptics, initial encounter: Secondary | ICD-10-CM | POA: Diagnosis present

## 2017-07-08 DIAGNOSIS — F1021 Alcohol dependence, in remission: Secondary | ICD-10-CM

## 2017-07-08 DIAGNOSIS — F4010 Social phobia, unspecified: Secondary | ICD-10-CM

## 2017-07-08 DIAGNOSIS — X58XXXA Exposure to other specified factors, initial encounter: Secondary | ICD-10-CM | POA: Diagnosis present

## 2017-07-08 DIAGNOSIS — F17200 Nicotine dependence, unspecified, uncomplicated: Secondary | ICD-10-CM | POA: Diagnosis present

## 2017-07-08 DIAGNOSIS — F410 Panic disorder [episodic paroxysmal anxiety] without agoraphobia: Secondary | ICD-10-CM | POA: Diagnosis present

## 2017-07-08 DIAGNOSIS — F3181 Bipolar II disorder: Principal | ICD-10-CM | POA: Diagnosis present

## 2017-07-08 DIAGNOSIS — F329 Major depressive disorder, single episode, unspecified: Secondary | ICD-10-CM | POA: Insufficient documentation

## 2017-07-08 DIAGNOSIS — G4700 Insomnia, unspecified: Secondary | ICD-10-CM | POA: Diagnosis present

## 2017-07-08 DIAGNOSIS — F332 Major depressive disorder, recurrent severe without psychotic features: Secondary | ICD-10-CM

## 2017-07-08 DIAGNOSIS — F1994 Other psychoactive substance use, unspecified with psychoactive substance-induced mood disorder: Secondary | ICD-10-CM

## 2017-07-08 DIAGNOSIS — Z87898 Personal history of other specified conditions: Secondary | ICD-10-CM | POA: Diagnosis present

## 2017-07-08 DIAGNOSIS — F333 Major depressive disorder, recurrent, severe with psychotic symptoms: Secondary | ICD-10-CM | POA: Diagnosis not present

## 2017-07-08 DIAGNOSIS — F10920 Alcohol use, unspecified with intoxication, uncomplicated: Secondary | ICD-10-CM

## 2017-07-08 DIAGNOSIS — R45851 Suicidal ideations: Secondary | ICD-10-CM | POA: Insufficient documentation

## 2017-07-08 DIAGNOSIS — F419 Anxiety disorder, unspecified: Secondary | ICD-10-CM | POA: Insufficient documentation

## 2017-07-08 HISTORY — DX: Alcohol dependence, in remission: F10.21

## 2017-07-08 LAB — URINE DRUG SCREEN, QUALITATIVE (ARMC ONLY)
Amphetamines, Ur Screen: NOT DETECTED
BARBITURATES, UR SCREEN: NOT DETECTED
BENZODIAZEPINE, UR SCRN: NOT DETECTED
CANNABINOID 50 NG, UR ~~LOC~~: NOT DETECTED
COCAINE METABOLITE, UR ~~LOC~~: NOT DETECTED
MDMA (Ecstasy)Ur Screen: NOT DETECTED
Methadone Scn, Ur: NOT DETECTED
Opiate, Ur Screen: NOT DETECTED
Phencyclidine (PCP) Ur S: NOT DETECTED
TRICYCLIC, UR SCREEN: NOT DETECTED

## 2017-07-08 LAB — COMPREHENSIVE METABOLIC PANEL
ALK PHOS: 61 U/L (ref 38–126)
ALT: 21 U/L (ref 14–54)
ANION GAP: 7 (ref 5–15)
AST: 20 U/L (ref 15–41)
Albumin: 4.4 g/dL (ref 3.5–5.0)
BUN: 10 mg/dL (ref 6–20)
CALCIUM: 9.7 mg/dL (ref 8.9–10.3)
CO2: 25 mmol/L (ref 22–32)
CREATININE: 0.68 mg/dL (ref 0.44–1.00)
Chloride: 106 mmol/L (ref 101–111)
Glucose, Bld: 79 mg/dL (ref 65–99)
Potassium: 3.8 mmol/L (ref 3.5–5.1)
Sodium: 138 mmol/L (ref 135–145)
Total Bilirubin: 0.5 mg/dL (ref 0.3–1.2)
Total Protein: 7.9 g/dL (ref 6.5–8.1)

## 2017-07-08 LAB — CBC
HCT: 43.2 % (ref 35.0–47.0)
Hemoglobin: 14.3 g/dL (ref 12.0–16.0)
MCH: 27.9 pg (ref 26.0–34.0)
MCHC: 33.2 g/dL (ref 32.0–36.0)
MCV: 83.9 fL (ref 80.0–100.0)
PLATELETS: 263 10*3/uL (ref 150–440)
RBC: 5.15 MIL/uL (ref 3.80–5.20)
RDW: 13.1 % (ref 11.5–14.5)
WBC: 6.6 10*3/uL (ref 3.6–11.0)

## 2017-07-08 LAB — POC URINE PREG, ED: PREG TEST UR: NEGATIVE

## 2017-07-08 LAB — SALICYLATE LEVEL

## 2017-07-08 LAB — ACETAMINOPHEN LEVEL: Acetaminophen (Tylenol), Serum: <10 ug/mL — ABNORMAL LOW (ref 10–30)

## 2017-07-08 LAB — POCT PREGNANCY, URINE: PREG TEST UR: NEGATIVE

## 2017-07-08 LAB — ETHANOL: ALCOHOL ETHYL (B): 53 mg/dL — AB (ref <5)

## 2017-07-08 MED ORDER — QUETIAPINE FUMARATE 100 MG PO TABS
100.0000 mg | ORAL_TABLET | Freq: Every day | ORAL | Status: DC
  Administered 2017-07-08: 100 mg via ORAL
  Filled 2017-07-08: qty 1

## 2017-07-08 MED ORDER — ALUM & MAG HYDROXIDE-SIMETH 200-200-20 MG/5ML PO SUSP: 30.0000 mL | ORAL | Status: DC | PRN

## 2017-07-08 MED ORDER — NICOTINE 14 MG/24HR TD PT24
14.0000 mg | MEDICATED_PATCH | Freq: Every day | TRANSDERMAL | Status: DC
  Administered 2017-07-08: 14 mg via TRANSDERMAL
  Filled 2017-07-08: qty 1

## 2017-07-08 MED ORDER — CHLORDIAZEPOXIDE HCL 5 MG PO CAPS
5.0000 mg | ORAL_CAPSULE | Freq: Once | ORAL | Status: DC
  Administered 2017-07-08: 5 mg via ORAL
  Filled 2017-07-08: qty 1

## 2017-07-08 MED ORDER — NICOTINE 14 MG/24HR TD PT24
14.0000 mg | MEDICATED_PATCH | Freq: Every day | TRANSDERMAL | Status: DC
  Administered 2017-07-09 – 2017-07-11 (×3): 14 mg via TRANSDERMAL
  Filled 2017-07-08 (×3): qty 1

## 2017-07-08 MED ORDER — ACETAMINOPHEN 325 MG PO TABS
650.0000 mg | ORAL_TABLET | Freq: Four times a day (QID) | ORAL | Status: DC | PRN
  Administered 2017-07-09 – 2017-07-11 (×5): 650 mg via ORAL
  Filled 2017-07-08 (×5): qty 2

## 2017-07-08 MED ORDER — CHLORDIAZEPOXIDE HCL 5 MG PO CAPS
5.0000 mg | ORAL_CAPSULE | Freq: Once | ORAL | Status: AC
  Administered 2017-07-08: 5 mg via ORAL
  Filled 2017-07-08: qty 1

## 2017-07-08 MED ORDER — ESCITALOPRAM OXALATE 10 MG PO TABS
20.0000 mg | ORAL_TABLET | Freq: Every day | ORAL | Status: DC
  Administered 2017-07-09: 20 mg via ORAL
  Filled 2017-07-08: qty 2

## 2017-07-08 MED ORDER — DIAZEPAM 5 MG PO TABS
5.0000 mg | ORAL_TABLET | Freq: Once | ORAL | Status: DC
  Filled 2017-07-08: qty 1

## 2017-07-08 MED ORDER — MAGNESIUM HYDROXIDE 400 MG/5ML PO SUSP: 30.0000 mL | Freq: Every day | ORAL | Status: DC | PRN

## 2017-07-08 MED ORDER — QUETIAPINE FUMARATE 25 MG PO TABS: 100.0000 mg | ORAL_TABLET | Freq: Every day | ORAL | Status: DC

## 2017-07-08 MED ORDER — IBUPROFEN 800 MG PO TABS
400.0000 mg | ORAL_TABLET | Freq: Four times a day (QID) | ORAL | Status: DC | PRN
  Administered 2017-07-08: 400 mg via ORAL
  Filled 2017-07-08: qty 1

## 2017-07-08 MED ORDER — IBUPROFEN 200 MG PO TABS: 400.0000 mg | ORAL_TABLET | Freq: Four times a day (QID) | ORAL | Status: DC | PRN

## 2017-07-08 MED ORDER — ESCITALOPRAM OXALATE 10 MG PO TABS
20.0000 mg | ORAL_TABLET | Freq: Every day | ORAL | Status: DC
  Administered 2017-07-08: 20 mg via ORAL
  Filled 2017-07-08: qty 2

## 2017-07-08 NOTE — ED Triage Notes (Signed)
Pt presents to ED with mobile crisis c/o of SI thoughts. States plan to hurt self by falling down the stairs. Pt seems depression, quietly talking. Pt cooperative. Pt texting boyfriend in triage to let him know she is here. Denies HI. Pt agrees to blood, urine, and dressing out. Pt states hx of SI thoughts x months.
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
## 2017-07-08 NOTE — ED Notes (Signed)
Pt clothes bagged with flip flops, purse, nipple jewelry by this RN. Pt changed into paper scrubs.

## 2017-07-08 NOTE — Consult Note (Signed)
[place] [facility] Consult   Reason for Consult:  Consult for 26 year old woman who came to the emergency room for suicidal thoughts and depression and anxiety Referring Physician:  Jimmye Norman Patient Identification: Victoria [name] MRN:  [id number] Principal Diagnosis: Severe recurrent major depression without psychotic features [facility]) Diagnosis:   Patient Active Problem List   Diagnosis Date Noted  . Severe recurrent major depression without psychotic features ([place]) [F33.2] 07/08/2017  . Social anxiety disorder [F40.10] 07/08/2017  . Alcohol abuse [F10.10] 07/08/2017    Total Time spent with patient: 1 hour  Subjective:   Victoria [name] is a 26 y.o. female patient admitted with "I'm paranoid all the time".  HPI:  Patient interviewed chart reviewed. 26 year old woman came to the emergency room for help with her depression and anxiety. She said that she feels terrible all of the time and this is been going on for years but getting worse. She cannot function at all by her report. She can't hold a job because every time she gets one she gets paranoid and quits working. She feels crippled by anxiety and judgment whenever she is around other people. Her relationship with her boyfriend is bad because of her drinking and her anxiety and depression. Mood is depressed and anxious all the time with constant worries. Sleeps poorly at night. Does not eat very well. She has taken to drinking over the last 8 or 9 months and now drinks about a half of a pint of liquor or a couple of 24 ounce beers every day. Denies that she's using any other drugs. She is not currently getting any mental health treatment. She was last seen in April at [facility] and was taking Seroquel and Lexapro but discontinued them. She frequently has thoughts about running her car into traffic or finding some other way to harm herself. No thoughts of harming anybody else. Patient vaguely think she may have been bullied in the past  but otherwise is not relating this to a specific stressor.  Social history: Patient lives with her boyfriend. Their relationship is strained because of her illness and her drinking. She has had trouble completing tasks and trouble holding a job because she feels "paranoid"  Medical history: No known medical problems although papilloma virus was mentioned in the old record.  Substance abuse history: For the past 8 months she says she's been drinking pretty heavily. Drinks about a half a pint of liquor or a couple of 24 ounce beers every day. Last drink was this morning. Alcohol is the only way she can get to sleep. No history of seizures or DTs but has never participated in any substance abuse treatment. Does not abuse other drugs says that marijuana makes her feel even worse.  Past Psychiatric History: Patient has no previous hospitalizations. She does have a past history of overdose and suicide attempt. She used to be seen at [facility] and was on Seroquel and Lexapro with what she thought was minimal to no improvement.  Risk to Self: Is patient at risk for suicide?: Yes Risk to Others:   Prior Inpatient Therapy:   Prior Outpatient Therapy:    Past Medical History:  Past Medical History:  Diagnosis Date  . Anxiety   . Depression   . Genital herpes   . HPV (human papilloma virus) anogenital infection     Past Surgical History:  Procedure Laterality Date  . BACK SURGERY  2011   scoliosis repair  . DG SCOLIOSIS SERIES ([place] HX)  Family History: History reviewed. No pertinent family history. Family Psychiatric  History: She says she has an aunt to has some kind of undetermined mental health problem Social History:  History  Alcohol Use  . Yes    Comment: weekend     History  Drug Use No    Social History   Social History  . Marital status: Single    Spouse name: N/A  . Number of children: N/A  . Years of education: N/A   Social History Main Topics  . Smoking status:  Former Smoker    Years: 4.00  . Smokeless tobacco: Never Used  . Alcohol use Yes     Comment: weekend  . Drug use: No  . Sexual activity: Yes    Birth control/ protection: Implant   Other Topics Concern  . None   Social History Narrative  . None   Additional Social History:    Allergies:  No Known Allergies  Labs:  Results for orders placed or performed during the hospital encounter of 07/08/17 (from the past 48 hour(s))  Comprehensive metabolic panel     Status: None   Collection Time: 07/08/17  1:40 PM  Result Value Ref Range   Sodium 138 135 - 145 mmol/L   Potassium 3.8 3.5 - 5.1 mmol/L   Chloride 106 101 - 111 mmol/L   CO2 25 22 - 32 mmol/L   Glucose, Bld 79 65 - 99 mg/dL   BUN 10 6 - 20 mg/dL   Creatinine, Ser 0.68 0.44 - 1.00 mg/dL   Calcium 9.7 8.9 - 10.3 mg/dL   Total Protein 7.9 6.5 - 8.1 g/dL   Albumin 4.4 3.5 - 5.0 g/dL   AST 20 15 - 41 U/L   ALT 21 14 - 54 U/L   Alkaline Phosphatase 61 38 - 126 U/L   Total Bilirubin 0.5 0.3 - 1.2 mg/dL   GFR calc non Af Amer >60 >60 mL/min   GFR calc Af Amer >60 >60 mL/min    Comment: (NOTE) The eGFR has been calculated using the CKD EPI equation. This calculation has not been validated in all clinical situations. eGFR's persistently <60 mL/min signify possible Chronic Kidney Disease.    Anion gap 7 5 - 15  Ethanol     Status: Abnormal   Collection Time: 07/08/17  1:40 PM  Result Value Ref Range   Alcohol, Ethyl (B) 53 (H) <5 mg/dL    Comment:        LOWEST DETECTABLE LIMIT FOR SERUM ALCOHOL IS 5 mg/dL FOR MEDICAL PURPOSES ONLY   Salicylate level     Status: None   Collection Time: 07/08/17  1:40 PM  Result Value Ref Range   Salicylate Lvl <5.6 2.8 - 30.0 mg/dL  Acetaminophen level     Status: Abnormal   Collection Time: 07/08/17  1:40 PM  Result Value Ref Range   Acetaminophen (Tylenol), Serum <10 (L) 10 - 30 ug/mL    Comment:        THERAPEUTIC CONCENTRATIONS VARY SIGNIFICANTLY. A RANGE OF 10-30 ug/mL  MAY BE AN EFFECTIVE CONCENTRATION FOR MANY PATIENTS. HOWEVER, SOME ARE BEST TREATED AT CONCENTRATIONS OUTSIDE THIS RANGE. ACETAMINOPHEN CONCENTRATIONS >150 ug/mL AT 4 HOURS AFTER INGESTION AND >50 ug/mL AT 12 HOURS AFTER INGESTION ARE OFTEN ASSOCIATED WITH TOXIC REACTIONS.   cbc     Status: None   Collection Time: 07/08/17  1:40 PM  Result Value Ref Range   WBC 6.6 3.6 - 11.0 K/uL   RBC  5.15 3.80 - 5.20 MIL/uL   Hemoglobin 14.3 12.0 - 16.0 g/dL   HCT 43.2 35.0 - 47.0 %   MCV 83.9 80.0 - 100.0 fL   MCH 27.9 26.0 - 34.0 pg   MCHC 33.2 32.0 - 36.0 g/dL   RDW 13.1 11.5 - 14.5 %   Platelets 263 150 - 440 K/uL  Urine Drug Screen, Qualitative     Status: None   Collection Time: 07/08/17  1:40 PM  Result Value Ref Range   Tricyclic, Ur Screen NONE DETECTED NONE DETECTED   Amphetamines, Ur Screen NONE DETECTED NONE DETECTED   MDMA (Ecstasy)Ur Screen NONE DETECTED NONE DETECTED   Cocaine Metabolite,Ur [place] NONE DETECTED NONE DETECTED   Opiate, Ur Screen NONE DETECTED NONE DETECTED   Phencyclidine (PCP) Ur S NONE DETECTED NONE DETECTED   Cannabinoid 50 Ng, Ur [place] NONE DETECTED NONE DETECTED   Barbiturates, Ur Screen NONE DETECTED NONE DETECTED   Benzodiazepine, Ur Scrn NONE DETECTED NONE DETECTED   Methadone Scn, Ur NONE DETECTED NONE DETECTED    Comment: (NOTE) 748  Tricyclics, urine               Cutoff 1000 ng/mL 200  Amphetamines, urine             Cutoff 1000 ng/mL 300  MDMA (Ecstasy), urine           Cutoff 500 ng/mL 400  Cocaine Metabolite, urine       Cutoff 300 ng/mL 500  Opiate, urine                   Cutoff 300 ng/mL 600  Phencyclidine (PCP), urine      Cutoff 25 ng/mL 700  Cannabinoid, urine              Cutoff 50 ng/mL 800  Barbiturates, urine             Cutoff 200 ng/mL 900  Benzodiazepine, urine           Cutoff 200 ng/mL 1000 Methadone, urine                Cutoff 300 ng/mL 1100 1200 The urine drug screen provides only a preliminary, unconfirmed 1300 analytical  test result and should not be used for non-medical 1400 purposes. Clinical consideration and professional judgment should 1500 be applied to any positive drug screen result due to possible 1600 interfering substances. A more specific alternate chemical method 1700 must be used in order to obtain a confirmed analytical result.  1800 Gas chromato graphy / mass spectrometry (GC/MS) is the preferred 1900 confirmatory method.   POC urine preg, ED     Status: None   Collection Time: 07/08/17  2:29 PM  Result Value Ref Range   Preg Test, Ur Negative Negative  Pregnancy, urine POC     Status: None   Collection Time: 07/08/17  3:54 PM  Result Value Ref Range   Preg Test, Ur NEGATIVE NEGATIVE    Comment:        THE SENSITIVITY OF THIS METHODOLOGY IS >24 mIU/mL     Current Facility-Administered Medications  Medication Dose Route Frequency Provider Last Rate Last Dose  . chlordiazePOXIDE (LIBRIUM) capsule 5 mg  5 mg Oral Once Keshav Winegar T, MD      . nicotine (NICODERM CQ - dosed in mg/24 hours) patch 14 mg  14 mg Transdermal Daily Aunna Snooks, Madie Reno, MD       Current Outpatient Prescriptions  Medication  Sig Dispense Refill  . carbamide peroxide (DEBROX) 6.5 % otic solution Place 5 drops into both ears 2 (two) times daily. 15 mL 0  . QUEtiapine (SEROQUEL) 100 MG tablet Take 100 mg by mouth at bedtime.      Musculoskeletal: Strength & Muscle Tone: within normal limits Gait & Station: normal Patient leans: N/A  Psychiatric Specialty Exam: Physical Exam  Nursing note and vitals reviewed. Constitutional: She appears well-developed and well-nourished.  HENT:  Head: Normocephalic and atraumatic.  Eyes: Pupils are equal, round, and reactive to light. Conjunctivae are normal.  Neck: Normal range of motion.  Cardiovascular: Regular rhythm and normal heart sounds.   Respiratory: Effort normal and breath sounds normal. No respiratory distress.  GI: Soft.  Musculoskeletal: Normal range of  motion.  Neurological: She is alert.  Skin: Skin is warm and dry.  Psychiatric: Her mood appears anxious. Her speech is delayed. She is withdrawn. Thought content is paranoid. Thought content is not delusional. Cognition and memory are normal. She expresses impulsivity. She exhibits a depressed mood. She expresses suicidal ideation. She expresses suicidal plans.    Review of Systems  Constitutional: Negative.   HENT: Negative.   Eyes: Negative.   Respiratory: Negative.   Cardiovascular: Negative.   Gastrointestinal: Negative.   Musculoskeletal: Negative.   Skin: Negative.   Neurological: Negative.   Psychiatric/Behavioral: Positive for depression, memory loss, substance abuse and suicidal ideas. Negative for hallucinations. The patient is nervous/anxious and has insomnia.     Blood pressure 124/82, pulse 82, temperature 98.6 F (37 C), temperature source Oral, resp. rate 18, height 5' 4"  (1.626 m), weight 68 kg (150 lb), SpO2 100 %.Body mass index is 25.75 kg/m.  General Appearance: Casual  Eye Contact:  Fair  Speech:  Normal Rate  Volume:  Increased  Mood:  Anxious and Depressed  Affect:  Congruent and Tearful  Thought Process:  Goal Directed  Orientation:  Full (Time, Place, and Person)  Thought Content:  Paranoid Ideation, Rumination and Tangential  Suicidal Thoughts:  Yes.  with intent/plan  Homicidal Thoughts:  No  Memory:  Immediate;   Good Recent;   Fair Remote;   Fair  Judgement:  Impaired  Insight:  Lacking  Psychomotor Activity:  Decreased  Concentration:  Concentration: Fair  Recall:  [employer] of Knowledge:  Fair  Language:  Fair  Akathisia:  No  Handed:  Right  AIMS (if indicated):     Assets:  Desire for Improvement Housing Physical Health Resilience  ADL's:  Intact  Cognition:  WNL  Sleep:        Treatment Plan Summary: Daily contact with patient to assess and evaluate symptoms and progress in treatment, Medication management and Plan 26 year old  woman with alcohol abuse mood disorder anxiety disorder. Patient is tearful and upset and agitated and having active suicidal thoughts. She meets commitment criteria and requires inpatient treatment. She is agreeable to the plan. Full set of labs will be checked. We will restart the Seroquel at night as well as when necessary medication. Orders completed for admission as soon as possible. Case reviewed with emergency room physician and TTS. Detox medication as needed.  Disposition: Recommend psychiatric Inpatient admission when medically cleared. Supportive therapy provided about ongoing stressors.  Alethia Berthold, MD 07/08/2017 4:26 PM

## 2017-07-08 NOTE — ED Notes (Signed)
Pt states she has had issues with  Depression for years and was last [facility] and [facility] meds but states she has not taken them in months.. Pt is tearful states her boyfriends tries to help her is supportive. States she will start a new job and then get stressed and quit, but states "I cant afford to loose my job".Marland Kitchen

## 2017-07-08 NOTE — ED Provider Notes (Signed)
[facility] Emergency Department Provider Note       Time seen: ----------------------------------------- 2:17 PM on 07/08/2017 -----------------------------------------     I have reviewed the triage vital signs and the nursing notes.   HISTORY   Chief Complaint Suicidal    HPI Victoria [name] is a 26 y.o. female who presents to the ED for suicidal ideation. Patient arrives with mobile crisis complaining of suicidal thoughts. Patient states she plan to hurt herself by falling down some stairs. She admits to suicidal thoughts for several months, denies any homicidal ideations or hallucinations.   Past Medical History:  Diagnosis Date  . Anxiety   . Depression   . Genital herpes   . HPV (human papilloma virus) anogenital infection     There are no active problems to display for this patient.   Past Surgical History:  Procedure Laterality Date  . BACK SURGERY  2011   scoliosis repair  . DG SCOLIOSIS SERIES ([facility] HX)      Allergies Patient has no known allergies.  Social History Social History  Substance Use Topics  . Smoking status: Former Smoker    Years: 4.00  . Smokeless tobacco: Never Used  . Alcohol use Yes     Comment: weekend    Review of Systems Constitutional: Negative for fever. Cardiovascular: Negative for chest pain. Respiratory: Negative for shortness of breath. Gastrointestinal: Negative for abdominal pain, vomiting and diarrhea. Genitourinary: Negative for dysuria. Musculoskeletal: Negative for back pain. Skin: Negative for rash. Neurological: Negative for headaches, focal weakness or numbness. psychiatric:positive for suicidal ideation, anxiety  All systems negative/normal/unremarkable except as stated in the HPI  ____________________________________________   PHYSICAL EXAM:  VITAL SIGNS: ED Triage Vitals  Enc Vitals Group     BP 07/08/17 1338 124/82     Pulse Rate 07/08/17 1338 82     Resp 07/08/17  1338 18     Temp 07/08/17 1338 98.6 F (37 C)     Temp Source 07/08/17 1338 Oral     SpO2 07/08/17 1338 100 %     Weight 07/08/17 1339 150 lb (68 kg)     Height 07/08/17 1339  (1.626 m)     Head Circumference --      Peak Flow --      Pain Score 07/08/17 1338 0     Pain Loc --      Pain Edu? --      Excl. in GC? --    Constitutional: Alert and oriented. tearful, no distress Eyes: Conjunctivae are normal. Normal extraocular movements. ENT   Head: Normocephalic and atraumatic.   Nose: No congestion/rhinnorhea.   Mouth/Throat: Mucous membranes are moist.   Neck: No stridor. Cardiovascular: Normal rate, regular rhythm. No murmurs, rubs, or gallops. Respiratory: Normal respiratory effort without tachypnea nor retractions. Breath sounds are clear and equal bilaterally. No wheezes/rales/rhonchi. Gastrointestinal: Soft and nontender. Normal bowel sounds Musculoskeletal: Nontender with normal range of motion in extremities. No lower extremity tenderness nor edema. Neurologic:  Normal speech and language. No gross focal neurologic deficits are appreciated.  Skin:  Skin is warm, dry and intact. No rash noted. Psychiatric: depressed mood ____________________________________________  ED COURSE:  Pertinent labs & imaging results that were available during my care of the patient were reviewed by me and considered in my medical decision making (see chart for details). Patient presents for suicidal ideation, we will assess with labs and imaging as indicated.   Procedures ____________________________________________   LABS (pertinent positives/negatives)  Labs Reviewed  CBC  COMPREHENSIVE METABOLIC PANEL  ETHANOL  SALICYLATE LEVEL  ACETAMINOPHEN LEVEL  URINE DRUG SCREEN, QUALITATIVE ([facility] ONLY)  POC URINE PREG, ED  ____________________________________________  FINAL ASSESSMENT AND PLAN  suicidal ideation   Plan: Patient's labs  were dictated above. Patient had  presented for suicidal thought and appears medically stable for psychiatric evaluation and disposition.   Emily Filbert, MD   Note: This note was generated in part or whole with voice recognition software. Voice recognition is usually quite accurate but there are transcription errors that can and very often do occur. I apologize for any typographical errors that were not detected and corrected.     Emily Filbert, MD 07/08/17 [phone]

## 2017-07-08 NOTE — ED Notes (Signed)
Pt. Alert and oriented, warm and dry, in no distress. Pt. Denies SI, HI, and AVH. Pt is aware of transfer to BMU. Pt. Encouraged to let nursing staff know of any concerns or needs.

## 2017-07-08 NOTE — ED Notes (Signed)
PT  VOL  SEEN  BY  DR  [name]  PENDING  PLACEMENT 

## 2017-07-08 NOTE — ED Notes (Signed)
Gave patient food tray and sprite.

## 2017-07-08 NOTE — ED Notes (Signed)
Patient signed voluntary commitment form, states " I need to be home by Friday to pay my bills," and she was worried about her ability to sleep, states " I drink liquor each night to sleep. Patient is safe, contracts for safety here, q 15 minute checks and camera surveillance in progress for safety.

## 2017-07-08 NOTE — ED Notes (Signed)
Report called to [employer].

## 2017-07-09 ENCOUNTER — Encounter: Payer: Self-pay | Admitting: Psychiatry

## 2017-07-09 DIAGNOSIS — F333 Major depressive disorder, recurrent, severe with psychotic symptoms: Secondary | ICD-10-CM

## 2017-07-09 LAB — HEMOGLOBIN A1C
HEMOGLOBIN A1C: 5.3 % (ref 4.8–5.6)
MEAN PLASMA GLUCOSE: 105.41 mg/dL

## 2017-07-09 LAB — LIPID PANEL
CHOL/HDL RATIO: 5.1 ratio
CHOLESTEROL: 285 mg/dL — AB (ref 0–200)
HDL: 56 mg/dL (ref >40)
LDL Cholesterol: 204 mg/dL — ABNORMAL HIGH (ref 0–99)
TRIGLYCERIDES: 124 mg/dL (ref <150)
VLDL: 25 mg/dL (ref 0–40)

## 2017-07-09 LAB — TSH: TSH: 1.421 u[IU]/mL (ref 0.350–4.500)

## 2017-07-09 MED ORDER — QUETIAPINE FUMARATE 25 MG PO TABS: 50.0000 mg | ORAL_TABLET | Freq: Every day | ORAL | Status: DC

## 2017-07-09 MED ORDER — FLUVOXAMINE MALEATE 50 MG PO TABS
50.0000 mg | ORAL_TABLET | Freq: Every day | ORAL | Status: DC
  Administered 2017-07-09: 50 mg via ORAL
  Filled 2017-07-09: qty 1

## 2017-07-09 MED ORDER — INFLUENZA VAC SPLIT QUAD 0.5 ML IM SUSY
0.5000 mL | PREFILLED_SYRINGE | INTRAMUSCULAR | Status: AC
  Administered 2017-07-10: 0.5 mL via INTRAMUSCULAR
  Filled 2017-07-09: qty 0.5

## 2017-07-09 MED ORDER — PNEUMOCOCCAL VAC POLYVALENT 25 MCG/0.5ML IJ INJ
0.5000 mL | INJECTION | INTRAMUSCULAR | Status: DC
  Filled 2017-07-09: qty 0.5

## 2017-07-09 MED ORDER — CITALOPRAM HYDROBROMIDE 20 MG PO TABS
40.0000 mg | ORAL_TABLET | Freq: Every day | ORAL | Status: DC
  Administered 2017-07-09 – 2017-07-11 (×3): 40 mg via ORAL
  Filled 2017-07-09 (×3): qty 2

## 2017-07-09 MED ORDER — RISPERIDONE 1 MG PO TABS
2.0000 mg | ORAL_TABLET | Freq: Every day | ORAL | Status: DC
  Administered 2017-07-09 – 2017-07-10 (×2): 2 mg via ORAL
  Filled 2017-07-09 (×2): qty 2

## 2017-07-09 MED ORDER — LAMOTRIGINE 25 MG PO TABS
25.0000 mg | ORAL_TABLET | Freq: Every day | ORAL | Status: DC
  Administered 2017-07-09 – 2017-07-10 (×2): 25 mg via ORAL
  Filled 2017-07-09 (×2): qty 1

## 2017-07-09 NOTE — Progress Notes (Signed)
Patient ID: Victoria [name], female   DOB: 1991-10-21, 26 y.o.   MRN: [id number] LCSW Group Therapy Note 07/09/2017 9:00am  Type of Therapy and Topic:  Group Therapy:  Setting Goals  Participation Level:  Active  Description of Group: In this process group, patients discussed using strengths to work toward goals and address challenges.  Patients identified two positive things about themselves and one goal they were working on.  Patients were given the opportunity to share openly and support each other's plan for self-empowerment.  The group discussed the value of gratitude and were encouraged to have a daily reflection of positive characteristics or circumstances.  Patients were encouraged to identify a plan to utilize their strengths to work on current challenges and goals.  Therapeutic Goals 1. Patient will verbalize personal strengths/positive qualities and relate how these can assist with achieving desired personal goals 2. Patients will verbalize affirmation of peers plans for personal change and goal setting 3. Patients will explore the value of gratitude and positive focus as related to successful achievement of goals 4. Patients will verbalize a plan for regular reinforcement of personal positive qualities and circumstances.  Summary of Patient Progress:  Pt able to meet therapeutic goals listed above.  Verbalizes her goal for the day being to "accept what's going on"  She says she will do this by thinking about what lead her here. She had difficulty it seemed in being more concrete about ways she might gain acceptance.    Therapeutic Modalities Cognitive Behavioral Therapy Motivational Interviewing    Glennon Mac, LCSW 07/09/2017 2:12 PM

## 2017-07-09 NOTE — Plan of Care (Signed)
Problem: Activity: Goal: Sleeping patterns will improve Outcome: Progressing Patient slept for Estimated Hours of 7; Precautionary checks every 15 minutes for safety maintained, room free of safety hazards, patient sustains no injury or falls during this shift.    

## 2017-07-09 NOTE — Plan of Care (Signed)
Problem: Activity: Goal: Interest or engagement in leisure activities will improve Outcome: Progressing Patient engaged in activities outside of there room today, observed in dayroom interacting with select peers.  Problem: Education: Goal: Knowledge of the prescribed therapeutic regimen will improve Outcome: Progressing Patient verbalizes understanding of therapeutic regimen.  Problem: Coping: Goal: Ability to cope will improve Outcome: Progressing Patient has shown the ability to cope with her current situation. Goal: Ability to verbalize feelings will improve Outcome: Progressing Patient has shown the ability to verbalize feelings to staff.  Problem: Health Behavior/Discharge Planning: Goal: Ability to make decisions will improve Outcome: Progressing Patient has the ability to make concrete decisions. Goal: Compliance with therapeutic regimen will improve Outcome: Progressing Patient is compliant with current medication regimen.

## 2017-07-09 NOTE — Progress Notes (Signed)
Admission Note:  Patient's skin was assessed; skin warm dry and intact. Patient has an old midline incision on her back from back surgery due to scoliosis. Patient was searched and no contraband found, food and fluids offered, and fluids accepted. Pt had no additional questions or concerns, will continue to monitor.

## 2017-07-09 NOTE — Tx Team (Signed)
Initial Treatment Plan 07/09/2017 2:44 AM [name] [id number]    PATIENT STRESSORS: Medication change or noncompliance Substance abuse   PATIENT STRENGTHS: Ability for insight Average or above average intelligence Capable of independent living Motivation for treatment/growth Supportive family/friends   PATIENT IDENTIFIED PROBLEMS: Mood Insability  Suicidal Thoughts  Ineffective Coping Skills with (Anger/Anxiety/Personal and Professional Relationship)                 DISCHARGE CRITERIA:  Improved stabilization in mood, thinking, and/or behavior Motivation to continue treatment in a less acute level of care Verbal commitment to aftercare and medication compliance  PRELIMINARY DISCHARGE PLAN: Attend 12-step recovery group Outpatient therapy Return to previous living arrangement  PATIENT/FAMILY INVOLVEMENT: This treatment plan has been presented to and reviewed with the patient, Victoria [name].  The patient and family have been given the opportunity to ask questions and make suggestions.  Cleotis Nipper, RN 07/09/2017, 2:44 AM

## 2017-07-09 NOTE — H&P (Signed)
Psychiatric Admission Assessment Adult  Patient Identification: MIKALYN HERMIDA MRN:  [id number] Date of Evaluation:  07/09/2017 Chief Complaint:  Major Depression Principal Diagnosis: Severe recurrent major depressive disorder with psychotic features [facility]) Diagnosis:   Patient Active Problem List   Diagnosis Date Noted  . Severe recurrent major depressive disorder with psychotic features (HCC) [F33.3] 07/08/2017  . Social anxiety disorder [F40.10] 07/08/2017  . Alcohol use disorder, moderate, dependence (HCC) [F10.20] 07/08/2017   History of Present Illness:   Identifying data. Ms. Baez is a 26 year old female with history of depression, anxiety, and alcoholism.  Chief complaint. "I just wanted to talk to someone."  History of present illness. Information was obtained from the patient and the chart. The patient reports that for the past several days she has been feeling increasingly depressed and suicidal with a plan to throw herself down the stairs. She eventually decided to call the crisis line and came to the hospital. She did not participate to be admitted rather she thought that she will be able to speak to someone and get prescriptions for medications. She reports many symptoms of depression with extremely poor sleep, decreased appetite, anhedonia, feeling of guilt helplessness worthlessness, poor energy and concentration, crying spells and heightened anxiety with panic attacks and agoraphobia, increasing paranoia, and now suicidal ideation. The patient reports that she has been on the edge for several weeks now and had moments of severe agitation, racing thoughts, poor decision making. 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 She has extremely insomnia and has been drinking nightly to put herself to sleep. She denies psychotic symptoms but complains of paranoia. She is unable to get out of the house much because she thinks that people are talking about her and watching  her. She has very difficult time at work because she does not get along with coworkers. She wants to keep to herself but when forced intact she loses her composure. She does not report any big manic episodes but reports symptoms suggestive of bipolar mania lasting up to 2 days. Her boyfriend, did research on line and diagnosed her bipolar. The patient uses alcohol regularly but no other drugs.  Past psychiatric history. Long history of depression. She did see a psychiatrist at [facility] in April 2018. She was prescribed Celexa and Seroquel. She started taking more than prescribed Celexa and was accused of abusing medication. She was prescribed Seroquel but it made her so sleepy that she was unable to get up for work in the morning. She stopped taking Seroquel and started drinking. She tried to cut but reports that she is to try to do that. She does not report prior hospitalizations or suicide attempts   Family psychiatric history. Nonreported.  Social history. She lives with her boyfriend. She has 2 jobs. When she quit her job with benefits she started working for a [employer] during the week. He works at [employer] on weekends.  Total Time spent with patient: 1 hour   Is the patient at risk to self? Yes.    Has the patient been a risk to self in the past 6 months? No.  Has the patient been a risk to self within the distant past? No.  Is the patient a risk to others? No.  Has the patient been a risk to others in the past 6 months? No.  Has the patient been a risk to others within the distant past? No.   Prior Inpatient Therapy:  Prior Outpatient Therapy:    Alcohol Screening: 1. How often do you have a drink containing alcohol?: 4 or more times a week 2. How many drinks containing alcohol do you have on a typical day when you are drinking?: 5 or 6 3. How often do you have six or more drinks on one occasion?: Never Preliminary Score: 2 4. How often during the last year have you  found that you were not able to stop drinking once you had started?: Daily or almost daily 5. How often during the last year have you failed to do what was normally expected from you becasue of drinking?: Weekly 6. How often during the last year have you needed a first drink in the morning to get yourself going after a heavy drinking session?: Never 7. How often during the last year have you had a feeling of guilt of remorse after drinking?: Never 8. How often during the last year have you been unable to remember what happened the night before because you had been drinking?: Never 9. Have you or someone else been injured as a result of your drinking?: No 10. Has a relative or friend or a doctor or another health worker been concerned about your drinking or suggested you cut down?: Yes, during the last year Alcohol Use Disorder Identification Test Final Score (AUDIT): 17 Brief Intervention: Yes Substance Abuse History in the last 12 months:  Yes.   Consequences of Substance Abuse: Negative Previous Psychotropic Medications: Yes  Psychological Evaluations: No  Past Medical History:  Past Medical History:  Diagnosis Date  . Anxiety   . Depression   . Genital herpes   . HPV (human papilloma virus) anogenital infection     Past Surgical History:  Procedure Laterality Date  . BACK SURGERY  2011   scoliosis repair  . DG SCOLIOSIS SERIES ([facility] HX)     Family History: History reviewed. No pertinent family history.  Tobacco Screening: Have you used any form of tobacco in the last 30 days? (Cigarettes, Smokeless Tobacco, Cigars, and/or Pipes): Yes Tobacco use, Select all that apply: smokeless tobacco use daily Are you interested in Tobacco Cessation Medications?: No, patient refused Counseled patient on smoking cessation including recognizing danger situations, developing coping skills and basic information about quitting provided: Yes (Nicotine Patch) Social History:  History  Alcohol Use   . Yes    Comment: weekend     History  Drug Use No    Additional Social History:                           Allergies:  No Known Allergies Lab Results:  Results for orders placed or performed during the hospital encounter of 07/08/17 (from the past 48 hour(s))  Hemoglobin A1c     Status: None   Collection Time: 07/09/17  6:37 AM  Result Value Ref Range   Hgb A1c MFr Bld 5.3 4.8 - 5.6 %    Comment: (NOTE) Pre diabetes:          5.7%-6.4% Diabetes:              >6.4% Glycemic control for   <7.0% adults with diabetes    Mean Plasma Glucose 105.41 mg/dL    Comment: Performed at [facility], [address], [place], [place] [address]  Lipid panel     Status: Abnormal   Collection Time: 07/09/17  6:37 AM  Result Value Ref Range   Cholesterol  285 (H) 0 - 200 mg/dL   Triglycerides 161 <096 mg/dL   HDL 56 >04 mg/dL   Total CHOL/HDL Ratio 5.1 RATIO   VLDL 25 0 - 40 mg/dL   LDL Cholesterol 540 (H) 0 - 99 mg/dL    Comment:        Total Cholesterol/HDL:CHD Risk Coronary Heart Disease Risk Table                     Men   Women  1/2 Average Risk   3.4   3.3  Average Risk       5.0   4.4  2 X Average Risk   9.6   7.1  3 X Average Risk  23.4   11.0        Use the calculated Patient Ratio above and the CHD Risk Table to determine the patient's CHD Risk.        ATP III CLASSIFICATION (LDL):  <100     mg/dL   Optimal  981-191  mg/dL   Near or Above                    Optimal  130-159  mg/dL   Borderline  478-295  mg/dL   High  >621     mg/dL   Very High   TSH     Status: None   Collection Time: 07/09/17  6:37 AM  Result Value Ref Range   TSH 1.421 0.350 - 4.500 uIU/mL    Comment: Performed by a 3rd Generation assay with a functional sensitivity of <=0.01 uIU/mL.    Blood Alcohol level:  Lab Results  Component Value Date   ETH 53 (H) 07/08/2017   ETH <11 09/15/2011    Metabolic Disorder Labs:  Lab Results  Component Value Date   HGBA1C 5.3  07/09/2017   MPG 105.41 07/09/2017   No results found for: PROLACTIN Lab Results  Component Value Date   CHOL 285 (H) 07/09/2017   TRIG 124 07/09/2017   HDL 56 07/09/2017   CHOLHDL 5.1 07/09/2017   VLDL 25 07/09/2017   LDLCALC 204 (H) 07/09/2017    Current Medications: Current Facility-Administered Medications  Medication Dose Route Frequency Provider Last Rate Last Dose  . acetaminophen (TYLENOL) tablet 650 mg  650 mg Oral Q6H PRN [name], [name], MD   650 mg at 07/09/17 3086  . alum & mag hydroxide-simeth (MAALOX/MYLANTA) 200-200-20 MG/5ML suspension 30 mL  30 mL Oral Q4H PRN [name], [name], MD      . citalopram (CELEXA) tablet 40 mg  40 mg Oral Daily Nobuo Nunziata B, MD   40 mg at 07/09/17 1218  . fluvoxaMINE (LUVOX) tablet 50 mg  50 mg Oral QHS Clif Serio B, MD      . ibuprofen (ADVIL,MOTRIN) tablet 400 mg  400 mg Oral Q6H PRN [name], [name], MD      . Melene Muller ON 07/10/2017] Influenza vac split quadrivalent PF (FLUARIX) injection 0.5 mL  0.5 mL Intramuscular Tomorrow-1000 Citlali Gautney B, MD      . lamoTRIgine (LAMICTAL) tablet 25 mg  25 mg Oral QHS Aubrianne Molyneux B, MD      . magnesium hydroxide (MILK OF MAGNESIA) suspension 30 mL  30 mL Oral Daily PRN [name], [name], MD      . nicotine (NICODERM CQ - dosed in mg/24 hours) patch 14 mg  14 mg Transdermal Daily [name], [name], MD   14 mg at 07/09/17  1610  Melene Muller ON 07/10/2017] pneumococcal 23 valent vaccine (PNU-IMMUNE) injection 0.5 mL  0.5 mL Intramuscular Tomorrow-1000 Jazzlin Clements B, MD      . QUEtiapine (SEROQUEL) tablet 50 mg  50 mg Oral QHS Jawuan Robb B, MD       PTA Medications: Prescriptions Prior to Admission  Medication Sig Dispense Refill Last Dose  . carbamide peroxide (DEBROX) 6.5 % otic solution Place 5 drops into both ears 2 (two) times daily. (Patient not taking: Reported on 07/08/2017) 15 mL 0 Not Taking at Unknown time  . QUEtiapine (SEROQUEL) 100 MG tablet Take 100 mg  by mouth at bedtime.   02/18/2017 at unknow    Musculoskeletal: Strength & Muscle Tone: within normal limits Gait & Station: normal Patient leans: N/A  Psychiatric Specialty Exam: I reviewed physical examination performed in the emergency room and agree with the findings. Physical Exam  Nursing note and vitals reviewed. Psychiatric: Her speech is normal. Her mood appears anxious. She is withdrawn. Thought content is paranoid. Cognition and memory are normal. She expresses impulsivity. She exhibits a depressed mood. She expresses suicidal ideation. She expresses suicidal plans.    Review of Systems  Psychiatric/Behavioral: Positive for depression, substance abuse and suicidal ideas. The patient is nervous/anxious and has insomnia.   All other systems reviewed and are negative.   Blood pressure 109/73, pulse 85, temperature 98.3 F (36.8 C), resp. rate 16, height  (1.626 m), weight 66.2 kg (146 lb), SpO2 100 %.Body mass index is 25.06 kg/m.  See SRA.                                                      Treatment Plan Summary: Daily contact with patient to assess and evaluate symptoms and progress in treatment and Medication management   Ms. Godette is a 26 year old female with history of depression, anxiety, and alcoholism admitted for worsening of her symptoms and suicidal ideation in the context of treatment noncompliance and drinking.  1. Suicidal ideation. The patient adamantly denies any thoughts intentions or plans to hurt herself or others. She is able to contract for safety in the hospital.  2. Mood and psychosis. She prefers Celexa to Lexapro. She feels too sedated from Seroquel we will lower the dose to 50 mg nightly. I will start Lamictal for mood stabilization.  3. Anxiety. With panic attacks and severe social anxiety and agoraphobia. We will try a dose of fluvoxamine is to see the patient can tolerate it.  4. Alcohol detox. She received a single  dose of Librium in the emergency room. There are no symptoms of alcohol withdrawal. Vital signs are stable.  5. Insomnia. This is addressed with Seroquel and Luvox.  6. Substance abuse treatment. The patient minimizes her problems and declines treatment. She uses alcohol to sleep.  7. Smoking. Nicotine patch is available.  8. Metabolic syndrome monitoring. Lipid panel, TSH, hemoglobin A1c are pending.  9. EKG. Pending.  10. Pregnancy test is negative.   11. Disposition. She will be discharged to home with her boyfriend. She will follow up with [facility].    Observation Level/Precautions:  15 minute checks  Laboratory:  CBC Chemistry Profile UDS UA  Psychotherapy:    Medications:    Consultations:    Discharge Concerns:    Estimated LOS:  Other:  Physician Treatment Plan for Primary Diagnosis: Severe recurrent major depressive disorder with psychotic features (HCC) Long Term Goal(s): Improvement in symptoms so as ready for discharge  Short Term Goals: Ability to identify changes in lifestyle to reduce recurrence of condition will improve, Ability to verbalize feelings will improve, Ability to disclose and discuss suicidal ideas, Ability to demonstrate self-control will improve, Ability to identify and develop effective coping behaviors will improve, Compliance with prescribed medications will improve and Ability to identify triggers associated with substance abuse/mental health issues will improve  Physician Treatment Plan for Secondary Diagnosis: Principal Problem:   Severe recurrent major depressive disorder with psychotic features (HCC) Active Problems:   Social anxiety disorder   Alcohol use disorder, moderate, dependence (HCC)  Long Term Goal(s): Improvement in symptoms so as ready for discharge  Short Term Goals: Ability to identify changes in lifestyle to reduce recurrence of condition will improve, Ability to demonstrate self-control will improve and Ability to identify  triggers associated with substance abuse/mental health issues will improve  I certify that inpatient services furnished can reasonably be expected to improve the patient's condition.    Kristine Linea, MD 9/18/20183:23 PM

## 2017-07-09 NOTE — Progress Notes (Signed)
Chaplain received an order to visit with pt in room 302. Order was for suicidal thoughts. Chaplain met with nursing tech to see if pt wanted to see a chaplain. Pt did not want to see the chaplain. Chaplain is available for follow    07/09/17 1015  Clinical Encounter Type  Visited With Patient  Visit Type Initial;Spiritual support  Referral From Nurse  Consult/Referral To Chaplain  Spiritual Encounters  Spiritual Needs Other (Comment)  up as needed.

## 2017-07-09 NOTE — [facility] Suicide Risk Assessment (Signed)
[facility] Admission Suicide Risk Assessment   Nursing information obtained from:  Patient, Review of record Demographic factors:  Low socioeconomic status Current Mental Status:  Suicidal ideation indicated by patient, Suicide plan, Belief that plan would result in death Loss Factors:  NA Historical Factors:  Impulsivity Risk Reduction Factors:  Employed, Living with another person, especially a relative, Positive social support, Positive therapeutic relationship  Total Time spent with patient: 1 hour Principal Problem: Severe recurrent major depressive disorder with psychotic features (HCC) Diagnosis:   Patient Active Problem List   Diagnosis Date Noted  . Severe recurrent major depressive disorder with psychotic features (HCC) [F33.3] 07/08/2017  . Social anxiety disorder [F40.10] 07/08/2017  . Alcohol use disorder, moderate, dependence (HCC) [F10.20] 07/08/2017   Subjective Data: suicidal ideation.  Continued Clinical Symptoms:  Alcohol Use Disorder Identification Test Final Score (AUDIT): 17 The "Alcohol Use Disorders Identification Test", Guidelines for Use in Primary Care, Second Edition.  World Science writer [facility]). Score between 0-7:  no or low risk or alcohol related problems. Score between 8-15:  moderate risk of alcohol related problems. Score between 16-19:  high risk of alcohol related problems. Score 20 or above:  warrants further diagnostic evaluation for alcohol dependence and treatment.   CLINICAL FACTORS:   Bipolar Disorder:   Mixed State Depression:   Comorbid alcohol abuse/dependence Impulsivity Insomnia Alcohol/Substance Abuse/Dependencies   Musculoskeletal: Strength & Muscle Tone: within normal limits Gait & Station: normal Patient leans: N/A  Psychiatric Specialty Exam: Physical Exam  Nursing note and vitals reviewed. Psychiatric: Her speech is normal. Her mood appears anxious. She is withdrawn. Cognition and memory are normal. She expresses  impulsivity. She exhibits a depressed mood. She expresses suicidal ideation. She expresses suicidal plans.    Review of Systems  Psychiatric/Behavioral: Positive for depression and substance abuse. The patient is nervous/anxious and has insomnia.   All other systems reviewed and are negative.   Blood pressure 109/73, pulse 85, temperature 98.3 F (36.8 C), resp. rate 16, height  (1.626 m), weight 66.2 kg (146 lb), SpO2 100 %.Body mass index is 25.06 kg/m.  General Appearance: Casual  Eye Contact:  Good  Speech:  Clear and Coherent  Volume:  Decreased  Mood:  Depressed and Hopeless  Affect:  Blunt  Thought Process:  Goal Directed and Descriptions of Associations: Intact  Orientation:  Full (Time, Place, and Person)  Thought Content:  Paranoid Ideation  Suicidal Thoughts:  Yes.  with intent/plan  Homicidal Thoughts:  No  Memory:  Immediate;   Fair Recent;   Fair Remote;   Fair  Judgement:  Poor  Insight:  Lacking  Psychomotor Activity:  Decreased  Concentration:  Concentration: Fair and Attention Span: Fair  Recall:  [employer] of Knowledge:  Fair  Language:  Fair  Akathisia:  No  Handed:  Right  AIMS (if indicated):     Assets:  Communication Skills Desire for Improvement Housing Intimacy Physical Health Resilience Social Support Vocational/Educational  ADL's:  Intact  Cognition:  WNL  Sleep:  Number of Hours: 7      COGNITIVE FEATURES THAT CONTRIBUTE TO RISK:  None    SUICIDE RISK:   Moderate:  Frequent suicidal ideation with limited intensity, and duration, some specificity in terms of plans, no associated intent, good self-control, limited dysphoria/symptomatology, some risk factors present, and identifiable protective factors, including available and accessible social support.  PLAN OF CARE: Hospital admission, medication management, substance abuse counseling, discharge planning.  Ms. Schepp is a 26 year old  female with history of depression, anxiety,  and alcoholism admitted for worsening of her symptoms and suicidal ideation in the context of treatment noncompliance and drinking.  1. Suicidal ideation. The patient adamantly denies any thoughts intentions or plans to hurt herself or others. She is able to contract for safety in the hospital.  2. Mood and psychosis. She prefers Celexa to Lexapro. She feels too sedated from Seroquel we will lower the dose to 50 mg nightly. I will start Lamictal for mood stabilization.  3. Anxiety. With panic attacks and severe social anxiety and agoraphobia. We will try a dose of fluvoxamine is to see the patient can tolerate it.  4. Alcohol detox. She received a single dose of Librium in the emergency room. There are no symptoms of alcohol withdrawal. Vital signs are stable.  5. Insomnia. This is addressed with Seroquel and Luvox.  6. Substance abuse treatment. The patient minimizes her problems and declines treatment. She uses alcohol to sleep.  7. Smoking. Nicotine patch is available.  8. Metabolic syndrome monitoring. Lipid panel, TSH, hemoglobin A1c are pending.  9. EKG. Pending.  10. Disposition. She will be discharged to home with her boyfriend. She will follow up with [facility].   I certify that inpatient services furnished can reasonably be expected to improve the patient's condition.   Kristine Linea, MD 07/09/2017, 3:13 PM

## 2017-07-09 NOTE — Progress Notes (Addendum)
Patient ID: Victoria [name], female   DOB: 03/05/1991, 26 y.o.   MRN: [id number] Preferred name "Victoria [name]" admitted voluntarily for depression and suicidal thoughts of driving her car into traffic or fall down some stairs; "I can't control my anger, I can't hold down a job because of it; I drink everyday, I don't do drugs, I need help. If I decided to have children are they going to be like me?" Patient BAL=53. A&Ox3, assertive, articulate, organized in thoughts and contents; allowed to express her feelings, clinical & moral support provided; encouraged to freely express feelings to reduce stress. Unit guidelines, Treatment agreements and expected behaviors discussed, Unit Orientation and room completed; received medications as scheduled; room closer to the nurses' station for frequent monitoring.
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
## 2017-07-09 NOTE — Progress Notes (Signed)
Patient is alert and oriented to person and place. Skin is warm, dry and intact. No limitations to all four extremities noted. Patient denies SI but states, "I just wish that I had some energy, I need some caffeine." After meeting with the doctor, patient's Lexapro was discontinues and Celexa was initiated. Patient was observed ambulating in hall during the shift with a steady gait. Attends meals with selective peer interaction noted. Observed resting in be with eyes closed this morning. Milieu remains therapeutic. Patient will be monitored and physician notified of any acute changes.

## 2017-07-10 DIAGNOSIS — F4310 Post-traumatic stress disorder, unspecified: Secondary | ICD-10-CM | POA: Diagnosis present

## 2017-07-10 MED ORDER — PRAZOSIN HCL 1 MG PO CAPS
1.0000 mg | ORAL_CAPSULE | Freq: Once | ORAL | Status: AC
  Administered 2017-07-10: 1 mg via ORAL
  Filled 2017-07-10: qty 1

## 2017-07-10 NOTE — Plan of Care (Signed)
Problem: Activity: Goal: Sleeping patterns will improve Outcome: Progressing Patient slept for Estimated Hours of 7.45; Precautionary checks every 15 minutes for safety maintained, room free of safety hazards, patient sustains no injury or falls during this shift.     

## 2017-07-10 NOTE — Progress Notes (Signed)
  [facility] Adult Case Management Discharge Plan :  Will you be returning to the same living situation after discharge:  Yes,  home with boyfriend At discharge, do you have transportation home?: Yes,  boyfriend to pick up Do you have the ability to pay for your medications: Yes,  Medication management referral  Release of information consent forms completed and in the chart;  Patient's signature needed at discharge.  Patient to Follow up at: Follow-up Information    [facility], [facility]. Go on 07/12/2017.   Why:  Walk in between 9am-4pm for hospital follow up and continuing medication management and counseling. Contact information: [address] [place] [address] [phone]           Next level of care provider has access to [facility] Link:no  Safety Planning and Suicide Prevention discussed: Yes,     Have you used any form of tobacco in the last 30 days? (Cigarettes, Smokeless Tobacco, Cigars, and/or Pipes): Yes  Has patient been referred to the Quitline?: Patient refused referral  Patient has been referred for addiction treatment: Yes  Glennon Mac, LCSW 07/10/2017, 4:36 PM

## 2017-07-10 NOTE — Tx Team (Signed)
Interdisciplinary Treatment and Diagnostic Plan Update  07/10/2017 Time of Session: 10:30am Victoria [name] MRN: [id number]  Principal Diagnosis: Severe recurrent major depressive disorder with psychotic features [facility])  Secondary Diagnoses: Principal Problem:   Severe recurrent major depressive disorder with psychotic features (HCC) Active Problems:   Social anxiety disorder   Alcohol use disorder, moderate, dependence (HCC)   Current Medications:  Current Facility-Administered Medications  Medication Dose Route Frequency Provider Last Rate Last Dose  . acetaminophen (TYLENOL) tablet 650 mg  650 mg Oral Q6H PRN [name], [name], MD   650 mg at 07/10/17 0636  . alum & mag hydroxide-simeth (MAALOX/MYLANTA) 200-200-20 MG/5ML suspension 30 mL  30 mL Oral Q4H PRN [name], [name], MD      . citalopram (CELEXA) tablet 40 mg  40 mg Oral Daily Pucilowska, [name], MD   40 mg at 07/10/17 0832  . ibuprofen (ADVIL,MOTRIN) tablet 400 mg  400 mg Oral Q6H PRN [name], [name], MD      . lamoTRIgine (LAMICTAL) tablet 25 mg  25 mg Oral QHS Pucilowska, [name], MD   25 mg at 07/09/17 2139  . magnesium hydroxide (MILK OF MAGNESIA) suspension 30 mL  30 mL Oral Daily PRN [name], [name], MD      . nicotine (NICODERM CQ - dosed in mg/24 hours) patch 14 mg  14 mg Transdermal Daily [name], [name], MD   14 mg at 07/10/17 0839  . pneumococcal 23 valent vaccine (PNU-IMMUNE) injection 0.5 mL  0.5 mL Intramuscular Tomorrow-1000 Pucilowska, [name], MD      . risperiDONE (RISPERDAL) tablet 2 mg  2 mg Oral QHS Pucilowska, [name], MD   2 mg at 07/09/17 2139   PTA Medications: Prescriptions Prior to Admission  Medication Sig Dispense Refill Last Dose  . carbamide peroxide (DEBROX) 6.5 % otic solution Place 5 drops into both ears 2 (two) times daily. (Patient not taking: Reported on 07/08/2017) 15 mL 0 Not Taking at Unknown time  . QUEtiapine (SEROQUEL) 100 MG tablet Take 100 mg by mouth at bedtime.    02/18/2017 at unknow    Patient Stressors: Medication change or noncompliance Substance abuse  Patient Strengths: Ability for insight Average or above average intelligence Capable of independent living Motivation for treatment/growth Supportive family/friends  Treatment Modalities: Medication Management, Group therapy, Case management,  1 to 1 session with clinician, Psychoeducation, Recreational therapy.   Physician Treatment Plan for Primary Diagnosis: Severe recurrent major depressive disorder with psychotic features (HCC) Long Term Goal(s): Improvement in symptoms so as ready for discharge Improvement in symptoms so as ready for discharge   Short Term Goals: Ability to identify changes in lifestyle to reduce recurrence of condition will improve Ability to verbalize feelings will improve Ability to disclose and discuss suicidal ideas Ability to demonstrate self-control will improve Ability to identify and develop effective coping behaviors will improve Compliance with prescribed medications will improve Ability to identify triggers associated with substance abuse/mental health issues will improve Ability to identify changes in lifestyle to reduce recurrence of condition will improve Ability to demonstrate self-control will improve Ability to identify triggers associated with substance abuse/mental health issues will improve  Medication Management: Evaluate patient's response, side effects, and tolerance of medication regimen.  Therapeutic Interventions: 1 to 1 sessions, Unit Group sessions and Medication administration.  Evaluation of Outcomes: Progressing  Physician Treatment Plan for Secondary Diagnosis: Principal Problem:   Severe recurrent major depressive disorder with psychotic features [facility]) Active Problems:   Social  anxiety disorder   Alcohol use disorder, moderate, dependence (HCC)  Long Term Goal(s): Improvement in symptoms so as ready for discharge Improvement  in symptoms so as ready for discharge   Short Term Goals: Ability to identify changes in lifestyle to reduce recurrence of condition will improve Ability to verbalize feelings will improve Ability to disclose and discuss suicidal ideas Ability to demonstrate self-control will improve Ability to identify and develop effective coping behaviors will improve Compliance with prescribed medications will improve Ability to identify triggers associated with substance abuse/mental health issues will improve Ability to identify changes in lifestyle to reduce recurrence of condition will improve Ability to demonstrate self-control will improve Ability to identify triggers associated with substance abuse/mental health issues will improve     Medication Management: Evaluate patient's response, side effects, and tolerance of medication regimen.  Therapeutic Interventions: 1 to 1 sessions, Unit Group sessions and Medication administration.  Evaluation of Outcomes: Progressing   RN Treatment Plan for Primary Diagnosis: Severe recurrent major depressive disorder with psychotic features (HCC) Long Term Goal(s): Knowledge of disease and therapeutic regimen to maintain health will improve  Short Term Goals: Ability to verbalize frustration and anger appropriately will improve, Ability to disclose and discuss suicidal ideas and Ability to identify and develop effective coping behaviors will improve  Medication Management: RN will administer medications as ordered by provider, will assess and evaluate patient's response and provide education to patient for prescribed medication. RN will report any adverse and/or side effects to prescribing provider.  Therapeutic Interventions: 1 on 1 counseling sessions, Psychoeducation, Medication administration, Evaluate responses to treatment, Monitor vital signs and CBGs as ordered, Perform/monitor CIWA, COWS, AIMS and Fall Risk screenings as ordered, Perform wound care  treatments as ordered.  Evaluation of Outcomes: Progressing   LCSW Treatment Plan for Primary Diagnosis: Severe recurrent major depressive disorder with psychotic features (HCC) Long Term Goal(s): Safe transition to appropriate next level of care at discharge, Engage patient in therapeutic group addressing interpersonal concerns.  Short Term Goals: Engage patient in aftercare planning with referrals and resources, Increase ability to appropriately verbalize feelings, Facilitate acceptance of mental health diagnosis and concerns and Identify triggers associated with mental health/substance abuse issues  Therapeutic Interventions: Assess for all discharge needs, 1 to 1 time with Social worker, Explore available resources and support systems, Assess for adequacy in community support network, Educate family and significant other(s) on suicide prevention, Complete Psychosocial Assessment, Interpersonal group therapy.  Evaluation of Outcomes: Progressing   Progress in Treatment: Attending groups: Yes. Participating in groups: Yes. Taking medication as prescribed: Yes. Toleration medication: Yes. Family/Significant other contact made: No, will contact:  boyfriend Patient understands diagnosis: Yes. Discussing patient identified problems/goals with staff: Yes. Medical problems stabilized or resolved: Yes. Denies suicidal/homicidal ideation: Yes. Issues/concerns per patient self-inventory: No. Other:    New problem(s) identified: No, Describe:     New Short Term/Long Term Goal(s):  Discharge Plan or Barriers:   Reason for Continuation of Hospitalization: Depression Medication stabilization Other; describe Anxiety, paranoia  Estimated Length of Stay:  Attendees: Patient:Victoria [name] 07/10/2017 12:10 PM  Physician: Braulio Conte Pucilowska 07/10/2017 12:10 PM  Nursing: Hulan Amato, RN 07/10/2017 12:10 PM  RN Care Manager: 07/10/2017 12:10 PM  Social Worker: Jake Shark, LCSW 07/10/2017 12:10  PM  Recreational Therapist:  07/10/2017 12:10 PM  Other:  07/10/2017 12:10 PM  Other:  07/10/2017 12:10 PM  Other: 07/10/2017 12:10 PM    Scribe for Treatment Team: Glennon Mac, LCSW 07/10/2017 12:10 PM

## 2017-07-10 NOTE — [facility] Suicide Risk Assessment (Signed)
[facility] INPATIENT:  Family/Significant Other Suicide Prevention Education  Suicide Prevention Education:  Education Completed; Tad Moore, Boyfriend,, [phone],  (name of family member/significant other) has been identified by the patient as the family member/significant other with whom the patient will be residing, and identified as the person(s) who will aid the patient in the event of a mental health crisis (suicidal ideations/suicide attempt).  With written consent from the patient, the family member/significant other has been provided the following suicide prevention education, prior to the and/or following the discharge of the patient.  The suicide prevention education provided includes the following:  Suicide risk factors  Suicide prevention and interventions  National Suicide Hotline telephone number  [facility] assessment telephone number  [facility] Emergency Assistance 911  [facility] and/or Residential Mobile Crisis Unit telephone number  Request made of family/significant other to:  Remove weapons (e.g., guns, rifles, knives), all items previously/currently identified as safety concern.    Remove drugs/medications (over-the-counter, prescriptions, illicit drugs), all items previously/currently identified as a safety concern.  The family member/significant other verbalizes understanding of the suicide prevention education information provided.  The family member/significant other agrees to remove the items of safety concern listed above.  Mr. Aline August is supportive of Pt's follow up plan and willing to assist her in accessing resources to prioritize her health.  Seems to have realistic expectations of Pt's abilities.  CSW provided information on local family support groups and how to access additional Information systems manager.   Cleda Daub Bairon Klemann, LCSW 07/10/2017, 1:29 PM

## 2017-07-10 NOTE — [facility] Group Notes (Signed)
[facility] Group Notes:  (Nursing/MHT/Case Management/Adjunct)  Date:  07/10/2017  Time:  6:08 PM  Type of Therapy:  Psychoeducational Skills  Participation Level:  Active  Participation Quality:  Appropriate, Attentive and Sharing  Affect:  Appropriate  Cognitive:  Alert and Appropriate  Insight:  Appropriate  Engagement in Group:  Engaged  Modes of Intervention:  Discussion, Education and Support  Summary of Progress/Problems:  Victoria [name] 07/10/2017, 6:08 PM 

## 2017-07-10 NOTE — Plan of Care (Signed)
Problem: Education: Goal: Ability to make informed decisions regarding treatment will improve Outcome: Progressing Patient more confident in decision making   Problem: Coping: Goal: Ability to cope will improve Outcome: Progressing Working on coping skills   Problem: Health Behavior/Discharge Planning: Goal: Identification of resources available to assist in meeting health care needs will improve Outcome: Progressing Social Work assisted through  Loss adjuster, chartered   Problem: Medication: Goal: Compliance with prescribed medication regimen will improve Outcome: Progressing Verbalize understanding of medications received   Problem: Self-Concept: Goal: Ability to disclose and discuss suicidal ideas will improve Outcome: Progressing Patient  Denies suicidal ideations  Goal: Ability to verbalize positive feelings about self will improve Outcome: Progressing Working on  Coping skills   Problem: Activity: Goal: Interest or engagement in activities will improve Outcome: Progressing Attending unit programing  Goal: Sleeping patterns will improve Outcome: Progressing Voice no concerns around sleep  Problem: Education: Goal: Knowledge of  General Education information/materials will improve Outcome: Progressing Verbalize understanding of information  Received  Goal: Emotional status will improve Outcome: Progressing No periods of crying or hysteria  Goal: Mental status will improve Outcome: Progressing Attending  Unit programing , verbalize feeling  Goal: Verbalization of understanding the information provided will improve Outcome: Progressing Verbalize understanding of information received   Problem: Coping: Goal: Ability to verbalize frustrations and anger appropriately will improve Outcome: Progressing N.o anger outburst  Goal: Ability to demonstrate self-control will improve Outcome: Progressing Continue to work on coping skills   Problem: Health  Behavior/Discharge Planning: Goal: Identification of resources available to assist in meeting health care needs will improve Outcome: Progressing Social work and Loss adjuster, chartered assisted with outside resources  To meet patient needs  Goal: Compliance with treatment plan for underlying cause of condition will improve Outcome: Progressing Compliant with unit  Programing   Problem: Safety: Goal: Periods of time without injury will increase Outcome: Progressing No injuries this  admission   Problem: Activity: Goal: Interest or engagement in leisure activities will improve Outcome: Progressing Attending  Activity on unit  Goal: Imbalance in normal sleep/wake cycle will improve Outcome: Progressing No  Problems  With sleep cycle   Problem: Education: Goal: Knowledge of the prescribed therapeutic regimen will improve Outcome: Progressing Patient is knowledgeable  Of  Medication  Given   Problem: Coping: Goal: Ability to cope will improve Outcome: Progressing Continue to work on coping skills  Goal: Ability to verbalize feelings will improve Outcome: Progressing Patient  More at ease in verbalization of feeling   Problem: Health Behavior/Discharge Planning: Goal: Ability to make decisions will improve Outcome: Progressing Continue to work  on Psychologist, counselling  With  Coping skills

## 2017-07-10 NOTE — Progress Notes (Signed)
D:  A;ffect cheerful on approach . Interacting  With peers Patient stated slept good last night .Stated appetite is good and energy level  Is normal. Stated concentration is good . Stated on Depression scale 2, hopeless 2 and anxiety 2 .( low 0-10 high) Denies suicidal  homicidal ideations  .  No auditory hallucinations  No pain concerns . Appropriate ADL'S. Interacting with peers and staff.  Patient started on minipress. Instructions given  A: Encourage patient participation with unit programming . Instruction  Given on  Medication , verbalize understanding. R: Voice no other concerns. Staff continue to monitor

## 2017-07-10 NOTE — Progress Notes (Signed)
Patient ID: Victoria [name], female   DOB: [birth year], 26 y.o.   MRN: [id number] "I talked to Dr. Demetrius Charity, I will be discharged on Thursday, Dr. Demetrius Charity discussed medication adjustments.." Pleased with her care so far, denied any anger outburst, denied SI/HI, hopeful, optimistic, enduring and positive of expected outcome after community re-entry. Pleasant, polite, denied pain, visitation by BF went well according to her.
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
## 2017-07-10 NOTE — [facility] Group Notes (Signed)
  [facility] LCSW Group Therapy Note  Date/Time: 07/10/17, 0930  Type of Therapy/Topic:  Group Therapy:  Emotion Regulation  Participation Level:  Did Not Attend   Mood:  Description of Group:    The purpose of this group is to assist patients in learning to regulate negative emotions and experience positive emotions. Patients will be guided to discuss ways in which they have been vulnerable to their negative emotions. These vulnerabilities will be juxtaposed with experiences of positive emotions or situations, and patients challenged to use positive emotions to combat negative ones. Special emphasis will be placed on coping with negative emotions in conflict situations, and patients will process healthy conflict resolution skills.  Therapeutic Goals: 1. Patient will identify two positive emotions or experiences to reflect on in order to balance out negative emotions:  2. Patient will label two or more emotions that they find the most difficult to experience:  3. Patient will be able to demonstrate positive conflict resolution skills through discussion or role plays:   Summary of Patient Progress:       Therapeutic Modalities:   Cognitive Behavioral Therapy Feelings Identification Dialectical Behavioral Therapy  Greg Cataldo Cosgriff, LCSW 

## 2017-07-10 NOTE — [facility] Group Notes (Signed)
[facility] Group Notes:  (Nursing/MHT/Case Management/Adjunct)  Date:  07/10/2017  Time:  2:05 AM  Type of Therapy:  Psychoeducational Skills  Participation Level:  Active  Participation Quality:  Appropriate, Attentive and Sharing  Affect:  Appropriate  Cognitive:  Appropriate  Insight:  Appropriate and Good  Engagement in Group:  Engaged  Modes of Intervention:  Discussion, Socialization and Support  Summary of Progress/Problems:  Victoria [name] 07/10/2017, 2:05 AM

## 2017-07-11 DIAGNOSIS — Z87891 Personal history of nicotine dependence: Secondary | ICD-10-CM | POA: Diagnosis present

## 2017-07-11 DIAGNOSIS — F17200 Nicotine dependence, unspecified, uncomplicated: Secondary | ICD-10-CM | POA: Diagnosis present

## 2017-07-11 MED ORDER — RISPERIDONE 2 MG PO TABS: 2.0000 mg | ORAL_TABLET | Freq: Every day | ORAL | 1 refills | Status: DC

## 2017-07-11 MED ORDER — LAMOTRIGINE 25 MG PO TABS: 25.0000 mg | ORAL_TABLET | Freq: Every day | ORAL | 1 refills | Status: DC

## 2017-07-11 MED ORDER — PRAZOSIN HCL 1 MG PO CAPS: 1.0000 mg | ORAL_CAPSULE | Freq: Two times a day (BID) | ORAL | 1 refills | Status: DC

## 2017-07-11 MED ORDER — PRAZOSIN HCL 1 MG PO CAPS
1.0000 mg | ORAL_CAPSULE | Freq: Two times a day (BID) | ORAL | Status: DC
  Administered 2017-07-11 (×2): 1 mg via ORAL
  Filled 2017-07-11 (×3): qty 1

## 2017-07-11 MED ORDER — CITALOPRAM HYDROBROMIDE 40 MG PO TABS: 40.0000 mg | ORAL_TABLET | Freq: Every day | ORAL | 1 refills | Status: DC

## 2017-07-11 NOTE — [facility] Group Notes (Signed)
[facility] LCSW Group Therapy Note  Date/Time: 07/11/17, 0930  Type of Therapy/Topic:  Group Therapy:  Balance in Life  Participation Level:  active  Description of Group:    This group will address the concept of balance and how it feels and looks when one is unbalanced. Patients will be encouraged to process areas in their lives that are out of balance, and identify reasons for remaining unbalanced. Facilitators will guide patients utilizing problem- solving interventions to address and correct the stressor making their life unbalanced. Understanding and applying boundaries will be explored and addressed for obtaining  and maintaining a balanced life. Patients will be encouraged to explore ways to assertively make their unbalanced needs known to significant others in their lives, using other group members and facilitator for support and feedback.  Therapeutic Goals: 1. Patient will identify two or more emotions or situations they have that consume much of in their lives. 2. Patient will identify signs/triggers that life has become out of balance:  3. Patient will identify two ways to set boundaries in order to achieve balance in their lives:  4. Patient will demonstrate ability to communicate their needs through discussion and/or role plays  Summary of Patient Progress:  Pt identified physical, particularly smoking and alcohol use, along with work as areas that are out of balance in her life.  Pt participated in group discussion about ways to recognize and adjust to areas that are out of balance.  Good participation.          Therapeutic Modalities:   Cognitive Behavioral Therapy Solution-Focused Therapy Assertiveness Training  Daleen Squibb, [place]

## 2017-07-11 NOTE — Discharge Summary (Signed)
Physician Discharge Summary Note  Patient:  Victoria [name] is an 26 y.o., female MRN:  [id number] DOB:  01-May-1991 Patient phone:  [phone] (home)  Patient address:   [address] [place] [address],  Total Time spent with patient: 30 minutes  Date of Admission:  07/08/2017 Date of Discharge: 07/11/2017  Reason for Admission:  Suicidal ideation.  Identifying data. Victoria [name] is a 26 year old female with history of depression, anxiety, and alcoholism.  Chief complaint. "I just wanted to talk to someone."  History of present illness. Information was obtained from the patient and the chart. The patient reports that for the past several days she has been feeling increasingly depressed and suicidal with a plan to throw herself down the stairs. She eventually decided to call the crisis line and came to the hospital. She did not participate to be admitted rather she thought that she will be able to speak to someone and get prescriptions for medications. She reports many symptoms of depression with extremely poor sleep, decreased appetite, anhedonia, feeling of guilt helplessness worthlessness, poor energy and concentration, crying spells and heightened anxiety with panic attacks and agoraphobia, increasing paranoia, and now suicidal ideation. The patient reports that she has been on the edge for several weeks now and had moments of severe agitation, racing thoughts, poor decision making. 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 She has extremely insomnia and has been drinking nightly to put herself to sleep. She denies psychotic symptoms but complains of paranoia. She is unable to get out of the house much because she thinks that people are talking about her and watching her. She has very difficult time at work because she does not get along with coworkers. She wants to keep to herself but when forced intact she loses her composure. She does not report any big manic episodes  but reports symptoms suggestive of bipolar mania lasting up to 2 days. Her boyfriend, did research on line and diagnosed her bipolar. The patient uses alcohol regularly but no other drugs.  Past psychiatric history. Long history of depression. She did see a psychiatrist at [facility] in April 2018. She was prescribed Celexa and Seroquel. She started taking more than prescribed Celexa and was accused of abusing medication. She was prescribed Seroquel but it made her so sleepy that she was unable to get up for work in the morning. She stopped taking Seroquel and started drinking. She tried to cut but reports that she is to try to do that. She does not report prior hospitalizations or suicide attempts   Family psychiatric history. Nonreported.  Social history. She lives with her boyfriend. She has 2 jobs. When she quit her job with benefits she started working for a [employer] during the week. He works at [employer] on weekends.  Principal Problem: Bipolar 2 disorder, major depressive episode [facility]) Discharge Diagnoses: Patient Active Problem List   Diagnosis Date Noted  . Tobacco use disorder [F17.200] 07/11/2017  . PTSD (post-traumatic stress disorder) [F43.10] 07/10/2017  . Bipolar 2 disorder, major depressive episode (HCC) [F31.81] 07/08/2017  . Social anxiety disorder [F40.10] 07/08/2017  . Alcohol use disorder, moderate, dependence (HCC) [F10.20] 07/08/2017   Past Medical History:  Past Medical History:  Diagnosis Date  . Anxiety   . Depression   . Genital herpes   . HPV (human papilloma virus) anogenital infection     Past Surgical History:  Procedure Laterality Date  . BACK SURGERY  2011   scoliosis repair  . DG SCOLIOSIS SERIES ([facility] HX)     Family History: History reviewed. No pertinent family history.  Social History:  History  Alcohol Use  . Yes    Comment: weekend     History  Drug Use No    Social History   Social History  . Marital status:  Single    Spouse name: N/A  . Number of children: N/A  . Years of education: N/A   Social History Main Topics  . Smoking status: Former Smoker    Years: 4.00  . Smokeless tobacco: Never Used  . Alcohol use Yes     Comment: weekend  . Drug use: No  . Sexual activity: Yes    Birth control/ protection: Implant   Other Topics Concern  . None   Social History Narrative  . None    Hospital Course:    Victoria [name] is a 26 year old female with a history of depression, anxiety, and alcoholism admitted for worsening of her symptoms and suicidal ideation in the context of treatment noncompliance and drinking.  1. Suicidal ideation. Resolved. The patient adamantly denies any thoughts intentions or plans to hurt herself or others. She is able to contract for safety. She is forward thinking and optimistic about the future. She is a loving daughter.   2. Mood and psychosis. We switched to Celexa due to patient's preference. She complained of sedation from Seroquel so we gave Risperdal and started Lamictal titration for mood stabilization.  3. Anxiety. She reports panic attacks, severe social anxiety, and nightmares and flashbacks of PTSD. We started Minipress.  4. Alcohol detox. She received a single dose of Librium in the emergency room. There were no symptoms of alcohol withdrawal. Vital signs were stable.  5. Insomnia. She slept better with Risperdone.   6. Substance abuse treatment. The patient minimizes her problems and declines treatment.   7. Smoking. Nicotine patch was available.  8. Metabolic syndrome monitoring. Lipid panel shows elevated cholesterol, TSH and hemoglobin A1c are normal.   9. EKG. Pending.  10. Pregnancy test was negative.   11. Disposition. She was discharged to home with her boyfriend. She will follow up with [facility].     Physical Findings: AIMS: Facial and Oral Movements Muscles of Facial Expression: None, normal Lips and Perioral Area: None,  normal Jaw: None, normal Tongue: None, normal,Extremity Movements Upper (arms, wrists, hands, fingers): None, normal Lower (legs, knees, ankles, toes): None, normal, Trunk Movements Neck, shoulders, hips: None, normal, Overall Severity Severity of abnormal movements (highest score from questions above): None, normal Incapacitation due to abnormal movements: None, normal Patient's awareness of abnormal movements (rate only patient's report): No Awareness, Dental Status Current problems with teeth and/or dentures?: No Does patient usually wear dentures?: No  CIWA:  CIWA-Ar Total: 0 COWS:     Musculoskeletal: Strength & Muscle Tone: within normal limits Gait & Station: normal Patient leans: N/A  Psychiatric Specialty Exam: Physical Exam  Nursing note and vitals reviewed. Psychiatric: Her speech is normal and behavior is normal. Thought content normal. Her mood appears anxious. Cognition and memory are normal. She expresses impulsivity.    Review of Systems  Constitutional: Negative.   HENT: Negative.   Eyes: Negative.   Respiratory: Negative.   Cardiovascular: Negative.   Gastrointestinal: Negative.   Genitourinary: Negative.   Musculoskeletal: Negative.   Skin: Negative.   Neurological: Negative.   Endo/Heme/Allergies: Negative.   Psychiatric/Behavioral: Positive for substance abuse.    Blood pressure  108/69, pulse 93, temperature 98.6 F (37 C), resp. rate 18, height  (1.626 m), weight 66.2 kg (146 lb), SpO2 100 %.Body mass index is 25.06 kg/m.  General Appearance: Casual  Eye Contact:  Good  Speech:  Clear and Coherent  Volume:  Normal  Mood:  Anxious  Affect:  Appropriate  Thought Process:  Goal Directed and Descriptions of Associations: Intact  Orientation:  Full (Time, Place, and Person)  Thought Content:  WDL  Suicidal Thoughts:  No  Homicidal Thoughts:  No  Memory:  Immediate;   Fair Recent;   Fair Remote;   Fair  Judgement:  Impaired  Insight:   Present  Psychomotor Activity:  Normal  Concentration:  Concentration: Fair and Attention Span: Fair  Recall:  [employer] of Knowledge:  Fair  Language:  Fair  Akathisia:  No  Handed:  Right  AIMS (if indicated):     Assets:  Communication Skills Desire for Improvement Housing Physical Health Resilience Social Support Vocational/Educational  ADL's:  Intact  Cognition:  WNL  Sleep:  Number of Hours: 7.15     Have you used any form of tobacco in the last 30 days? (Cigarettes, Smokeless Tobacco, Cigars, and/or Pipes): Yes  Has this patient used any form of tobacco in the last 30 days? (Cigarettes, Smokeless Tobacco, Cigars, and/or Pipes) Yes, Yes, A prescription for an FDA-approved tobacco cessation medication was offered at discharge and the patient refused  Blood Alcohol level:  Lab Results  Component Value Date   ETH 53 (H) 07/08/2017   ETH <11 09/15/2011    Metabolic Disorder Labs:  Lab Results  Component Value Date   HGBA1C 5.3 07/09/2017   MPG 105.41 07/09/2017   No results found for: PROLACTIN Lab Results  Component Value Date   CHOL 285 (H) 07/09/2017   TRIG 124 07/09/2017   HDL 56 07/09/2017   CHOLHDL 5.1 07/09/2017   VLDL 25 07/09/2017   LDLCALC 204 (H) 07/09/2017    See Psychiatric Specialty Exam and Suicide Risk Assessment completed by Attending Physician prior to discharge.  Discharge destination:  Home  Is patient on multiple antipsychotic therapies at discharge:  No   Has Patient had three or more failed trials of antipsychotic monotherapy by history:  No  Recommended Plan for Multiple Antipsychotic Therapies: NA  Discharge Instructions    Diet - low sodium heart healthy    Complete by:  As directed    Increase activity slowly    Complete by:  As directed      Allergies as of 07/11/2017   No Known Allergies     Medication List    STOP taking these medications   carbamide peroxide 6.5 % OTIC solution Commonly known as:  DEBROX    QUEtiapine 100 MG tablet Commonly known as:  SEROQUEL     TAKE these medications     Indication  citalopram 40 MG tablet Commonly known as:  CELEXA Take 1 tablet (40 mg total) by mouth daily.  Indication:  Depression, Posttraumatic Stress Disorder   lamoTRIgine 25 MG tablet Commonly known as:  LAMICTAL Take 1 tablet (25 mg total) by mouth at bedtime. Take 1 tab for 2 weeks, 2 tabs for 2 weeks, 4 tabs for 2 weeks, 8 tabs or 200 mg after.  Indication:  Manic-Depression, Depression   prazosin 1 MG capsule Commonly known as:  MINIPRESS Take 1 capsule (1 mg total) by mouth 2 (two) times daily.  Indication:  PTSD   risperiDONE 2  MG tablet Commonly known as:  RISPERDAL Take 1 tablet (2 mg total) by mouth at bedtime.  Indication:  Manic-Depression      Follow-up Information    Pc, [facility]. Go on 07/12/2017.   Why:  Walk in between 9am-4pm for hospital follow up and continuing medication management and counseling. Contact information: [address] [place] [address] [phone]           Follow-up recommendations:  Activity:  as tolerated. Diet:  regular. Other:  keep follow up appointment.  Comments:    Signed: Kristine Linea, MD 07/11/2017, 9:01 AM

## 2017-07-11 NOTE — Progress Notes (Signed)
Patient ID: Victoria [name], female   DOB: June 13, 1991, 26 y.o.   MRN: [id number] Pleasant, bright mood and affect, assertive, self-valued, "Dr Demetrius Charity adjusted my medications and added PTSD medication.I will take one day at a time, but I don't know if I still have a job waiting for me; but I can always go to my second job.." Patient was encouraged to continue focus on getting better and prepare for tomorrow; patient has the ability to abstract, thought process and content are organized; anticipating discharge in am.

## 2017-07-11 NOTE — Plan of Care (Signed)
Problem: Activity: Goal: Sleeping patterns will improve Outcome: Progressing Patient slept for Estimated Hours of 7.15; Precautionary checks every 15 minutes for safety maintained, room free of safety hazards, patient sustains no injury or falls during this shift.    

## 2017-07-11 NOTE — Progress Notes (Signed)
D: Patient is aware of  Discharge this shift .Patient denies suicidal /homicidal ideations. Patient received all belongings brought in  A: No Storage medications. Writer reviewed Discharge Summary, Suicide Risk Assessment, and Transitional Record. Patient also received Prescriptions   from  MD. A 7 day supply of medications given to patient and work letter  Aware  Of follow up appointment . R: Patient left unit with no questions  Or concerns  With boyfriend

## 2017-07-11 NOTE — BHH Suicide Risk Assessment (Signed)
[facility] Discharge Suicide Risk Assessment   Principal Problem: Bipolar 2 disorder, major depressive episode [facility]) Discharge Diagnoses:  Patient Active Problem List   Diagnosis Date Noted  . Tobacco use disorder [F17.200] 07/11/2017  . PTSD (post-traumatic stress disorder) [F43.10] 07/10/2017  . Bipolar 2 disorder, major depressive episode (HCC) [F31.81] 07/08/2017  . Social anxiety disorder [F40.10] 07/08/2017  . Alcohol use disorder, moderate, dependence (HCC) [F10.20] 07/08/2017    Total Time spent with patient: 30 minutes  Musculoskeletal: Strength & Muscle Tone: within normal limits Gait & Station: normal Patient leans: N/A  Psychiatric Specialty Exam: Review of Systems  Constitutional: Negative.   HENT: Negative.   Eyes: Negative.   Respiratory: Negative.   Cardiovascular: Negative.   Gastrointestinal: Negative.   Genitourinary: Negative.   Musculoskeletal: Negative.   Skin: Negative.   Neurological: Negative.   Endo/Heme/Allergies: Negative.   Psychiatric/Behavioral: Positive for substance abuse.    Blood pressure 108/69, pulse 93, temperature 98.6 F (37 C), resp. rate 18, height  (1.626 m), weight 66.2 kg (146 lb), SpO2 100 %.Body mass index is 25.06 kg/m.  General Appearance: Casual  Eye Contact::  Good  Speech:  Clear and Coherent409  Volume:  Normal  Mood:  Euthymic  Affect:  Appropriate  Thought Process:  Goal Directed and Descriptions of Associations: Intact  Orientation:  Full (Time, Place, and Person)  Thought Content:  WDL  Suicidal Thoughts:  No  Homicidal Thoughts:  No  Memory:  Immediate;   Fair Recent;   Fair Remote;   Fair  Judgement:  Impaired  Insight:  Present  Psychomotor Activity:  Normal  Concentration:  Fair  Recall:  [employer] of Knowledge:Fair  Language: Fair  Akathisia:  No  Handed:  Right  AIMS (if indicated):     Assets:  Communication Skills Desire for Improvement Housing Intimacy Physical  Health Resilience Social Support Vocational/Educational  Sleep:  Number of Hours: 7.15  Cognition: WNL  ADL's:  Intact   Mental Status Per Nursing Assessment::   On Admission:  Suicidal ideation indicated by patient, Suicide plan, Belief that plan would result in death  Demographic Factors:  Adolescent or young adult  Loss Factors: Decrease in vocational status  Historical Factors: Impulsivity  Risk Reduction Factors:   Sense of responsibility to family, Employed, Living with another person, especially a relative and Positive social support  Continued Clinical Symptoms:  Bipolar Disorder:   Depressive phase Alcohol/Substance Abuse/Dependencies  Cognitive Features That Contribute To Risk:  None    Suicide Risk:  Minimal: No identifiable suicidal ideation.  Patients presenting with no risk factors but with morbid ruminations; may be classified as minimal risk based on the severity of the depressive symptoms  Follow-up Information    Pc, [employer]. Go on 07/12/2017.   Why:  Walk in between 9am-4pm for hospital follow up and continuing medication management and counseling. Contact information: [address] [place] [address] [phone]           Plan Of Care/Follow-up recommendations:  Activity:  as tolerated. Diet:  regular. Other:  keep follow up appointment.  Kristine Linea, MD 07/11/2017, 9:01 AM

## 2017-07-11 NOTE — BHH Suicide Risk Assessment (Signed)
[facility] Discharge Suicide Risk Assessment   Principal Problem: Bipolar 2 disorder, major depressive episode [facility]) Discharge Diagnoses:  Patient Active Problem List   Diagnosis Date Noted  . PTSD (post-traumatic stress disorder) [F43.10] 07/10/2017  . Bipolar 2 disorder, major depressive episode (HCC) [F31.81] 07/08/2017  . Social anxiety disorder [F40.10] 07/08/2017  . Alcohol use disorder, moderate, dependence (HCC) [F10.20] 07/08/2017    Total Time spent with patient: 30 minutes  Musculoskeletal: Strength & Muscle Tone: within normal limits Gait & Station: normal Patient leans: N/A  Psychiatric Specialty Exam: Review of Systems  Constitutional: Negative.   HENT: Negative.   Eyes: Negative.   Respiratory: Negative.   Cardiovascular: Negative.   Gastrointestinal: Negative.   Genitourinary: Negative.   Musculoskeletal: Negative.   Skin: Negative.   Neurological: Negative.   Endo/Heme/Allergies: Negative.   Psychiatric/Behavioral: Positive for substance abuse.    Blood pressure 122/78, pulse 87, temperature 98.3 F (36.8 C), temperature source Oral, resp. rate 18, height  (1.626 m), weight 66.2 kg (146 lb), SpO2 100 %.Body mass index is 25.06 kg/m.  General Appearance: Casual  Eye Contact::  Good  Speech:  Clear and Coherent409  Volume:  Normal  Mood:  Euthymic  Affect:  Appropriate  Thought Process:  Goal Directed and Descriptions of Associations: Intact  Orientation:  Full (Time, Place, and Person)  Thought Content:  WDL  Suicidal Thoughts:  No  Homicidal Thoughts:  No  Memory:  Immediate;   Fair Recent;   Fair Remote;   Fair  Judgement:  Impaired  Insight:  Present  Psychomotor Activity:  Normal  Concentration:  Fair  Recall:  [employer] of Knowledge:Fair  Language: Fair  Akathisia:  No  Handed:  Right  AIMS (if indicated):     Assets:  Communication Skills Desire for Improvement Housing Intimacy Physical Health Resilience Social  Support Vocational/Educational  Sleep:  Number of Hours: 7.45  Cognition: WNL  ADL's:  Intact   Mental Status Per Nursing Assessment::   On Admission:  Suicidal ideation indicated by patient, Suicide plan, Belief that plan would result in death  Demographic Factors:  Adolescent or young adult  Loss Factors: Decrease in vocational status  Historical Factors: Impulsivity  Risk Reduction Factors:   Sense of responsibility to family, Employed, Living with another person, especially a relative and Positive social support  Continued Clinical Symptoms:  Bipolar Disorder:   Depressive phase Alcohol/Substance Abuse/Dependencies  Cognitive Features That Contribute To Risk:  None    Suicide Risk:  Minimal: No identifiable suicidal ideation.  Patients presenting with no risk factors but with morbid ruminations; may be classified as minimal risk based on the severity of the depressive symptoms  Follow-up Information    Pc, [employer]. Go on 07/12/2017.   Why:  Walk in between 9am-4pm for hospital follow up and continuing medication management and counseling. Contact information: [address] [place] [address] [phone]           Plan Of Care/Follow-up recommendations:  Activity:  as tolerated. Diet:  regular. Other:  keep follow up appointment.  Kristine Linea, MD 07/11/2017, 3:34 AM

## 2017-08-15 ENCOUNTER — Emergency Department
Admission: EM | Admit: 2017-08-15 | Discharge: 2017-08-15 | Disposition: A | Discharge status: Home / Self Care | Payer: Self-pay | Attending: Emergency Medicine | Admitting: Emergency Medicine

## 2017-08-15 ENCOUNTER — Encounter: Payer: Self-pay | Admitting: Emergency Medicine

## 2017-08-15 DIAGNOSIS — F419 Anxiety disorder, unspecified: Secondary | ICD-10-CM | POA: Insufficient documentation

## 2017-08-15 DIAGNOSIS — Z760 Encounter for issue of repeat prescription: Secondary | ICD-10-CM | POA: Insufficient documentation

## 2017-08-15 DIAGNOSIS — M545 Low back pain: Secondary | ICD-10-CM | POA: Insufficient documentation

## 2017-08-15 DIAGNOSIS — F3181 Bipolar II disorder: Secondary | ICD-10-CM

## 2017-08-15 DIAGNOSIS — G8929 Other chronic pain: Secondary | ICD-10-CM | POA: Insufficient documentation

## 2017-08-15 DIAGNOSIS — Z87891 Personal history of nicotine dependence: Secondary | ICD-10-CM | POA: Insufficient documentation

## 2017-08-15 DIAGNOSIS — F319 Bipolar disorder, unspecified: Secondary | ICD-10-CM | POA: Insufficient documentation

## 2017-08-15 LAB — URINALYSIS, COMPLETE (UACMP) WITH MICROSCOPIC
BACTERIA UA: NONE SEEN
Bilirubin Urine: NEGATIVE
GLUCOSE, UA: NEGATIVE mg/dL
Ketones, ur: NEGATIVE mg/dL
LEUKOCYTES UA: NEGATIVE
NITRITE: NEGATIVE
PH: 7 (ref 5.0–8.0)
PROTEIN: NEGATIVE mg/dL
RBC / HPF: NONE SEEN RBC/hpf (ref 0–5)
Specific Gravity, Urine: 1.008 (ref 1.005–1.030)

## 2017-08-15 LAB — POCT PREGNANCY, URINE: Preg Test, Ur: NEGATIVE

## 2017-08-15 MED ORDER — NAPROXEN 375 MG PO TABS
375.0000 mg | ORAL_TABLET | Freq: Once | ORAL | Status: DC
  Filled 2017-08-15: qty 1

## 2017-08-15 MED ORDER — NAPROXEN 500 MG PO TABS
500.0000 mg | ORAL_TABLET | Freq: Once | ORAL | Status: AC
  Administered 2017-08-15: 500 mg via ORAL

## 2017-08-15 MED ORDER — LAMOTRIGINE 100 MG PO TABS: 100.0000 mg | ORAL_TABLET | Freq: Every day | ORAL | 1 refills | Status: DC

## 2017-08-15 MED ORDER — NAPROXEN 500 MG PO TABS
ORAL_TABLET | ORAL | Status: AC
  Filled 2017-08-15: qty 1

## 2017-08-15 MED ORDER — RISPERIDONE 2 MG PO TABS: 2.0000 mg | ORAL_TABLET | Freq: Every day | ORAL | 1 refills | Status: DC

## 2017-08-15 MED ORDER — CITALOPRAM HYDROBROMIDE 40 MG PO TABS: 40.0000 mg | ORAL_TABLET | Freq: Every day | ORAL | 1 refills | Status: DC

## 2017-08-15 MED ORDER — PRAZOSIN HCL 1 MG PO CAPS: 1.0000 mg | ORAL_CAPSULE | Freq: Two times a day (BID) | ORAL | 1 refills | Status: DC

## 2017-08-15 NOTE — ED Provider Notes (Signed)
[facility] Emergency Department Provider Note  ____________________________________________   I have reviewed the triage vital signs and the nursing notes.   HISTORY  Chief Complaint Medication Refill    HPI Victoria [name] is a 26 y.o. female who is here for 2 different reasons one is that she wants all of her prescriptions refilled as she does not have access to them.  The patient has no SI or HI or acute psychiatric complaint.  The second is that she has chronic back pain in the lower back, is worse when she bends over for a long time she gets a "backache" sometimes.  This been going on for years.  No radiation or radicular symptoms no numbness no weakness no incontinence of bowel or bladder no trauma no fever no chills no dysuria no urinary frequency denies pregnancy.  Pain is worse when she bends over it gives her a back ache.  Nothing makes it better except for not working, she tried Excedrin with minimal relief.  Has not tried NSAIDs otherwise.   Past Medical History:  Diagnosis Date  . Anxiety   . Depression   . Genital herpes   . HPV (human papilloma virus) anogenital infection     Patient Active Problem List   Diagnosis Date Noted  . Bipolar 2 disorder (HCC) 08/15/2017  . Tobacco use disorder 07/11/2017  . PTSD (post-traumatic stress disorder) 07/10/2017  . Bipolar 2 disorder, major depressive episode (HCC) 07/08/2017  . Social anxiety disorder 07/08/2017  . Alcohol use disorder, moderate, dependence (HCC) 07/08/2017    Past Surgical History:  Procedure Laterality Date  . BACK SURGERY  2011   scoliosis repair  . DG SCOLIOSIS SERIES ([facility] HX)      Prior to Admission medications   Medication Sig Start Date End Date Taking? Authorizing Provider  citalopram (CELEXA) 40 MG tablet Take 1 tablet (40 mg total) by mouth daily. 08/15/17   [name], [name], MD  lamoTRIgine (LAMICTAL) 100 MG tablet Take 1 tablet (100 mg total) by mouth at  bedtime. Take 1 tab for 2 weeks, 2 tabs for 2 weeks, 4 tabs for 2 weeks, 8 tabs or 200 mg after. 08/15/17   [name], [name], MD  prazosin (MINIPRESS) 1 MG capsule Take 1 capsule (1 mg total) by mouth 2 (two) times daily. 08/15/17   [name], [name], MD  risperiDONE (RISPERDAL) 2 MG tablet Take 1 tablet (2 mg total) by mouth at bedtime. 08/15/17   [name], [name], MD    Allergies Patient has no known allergies.  No family history on file.  Social History Social History  Substance Use Topics  . Smoking status: Former Smoker    Years: 4.00  . Smokeless tobacco: Never Used  . Alcohol use Yes     Comment: weekend    Review of Systems Constitutional: No fever/chills Eyes: No visual changes. ENT: No sore throat. No stiff neck no neck pain Cardiovascular: Denies chest pain. Respiratory: Denies shortness of breath. Gastrointestinal:   no vomiting.  No diarrhea.  No constipation. Genitourinary: Negative for dysuria. Musculoskeletal: Negative lower extremity swelling Skin: Negative for rash. Neurological: Negative for severe headaches, focal weakness or numbness.   ____________________________________________   PHYSICAL EXAM:  VITAL SIGNS: ED Triage Vitals [08/15/17 1005]  Enc Vitals Group     BP 135/84     Pulse Rate 97     Resp 20     Temp 98.8 F (37.1 C)     Temp Source  Oral     SpO2 100 %     Weight 148 lb (67.1 kg)     Height 5\' 4"  (1.626 m)     Head Circumference      Peak Flow      Pain Score      Pain Loc      Pain Edu?      Excl. in GC?     Constitutional: Alert and oriented. Well appearing and in no acute distress. Eyes: Conjunctivae are normal Head: Atraumatic HEENT: No congestion/rhinnorhea. Mucous membranes are moist.  Oropharynx non-erythematous Neck:   Nontender with no meningismus, no masses, no stridor Cardiovascular: Normal rate, regular rhythm. Grossly normal heart sounds.  Good peripheral circulation. Respiratory: Normal respiratory effort.  No  retractions. Lungs CTAB. Abdominal: Soft and nontender. No distention. No guarding no rebound Back: There is minimal tenderness to palpation in the paraspinal muscles of the lumbar region which does not cross the midline.  This reproduces the patient's pain.  Is also worse when she changes position.  There is no erythema, not red or hot to touch.  There is no midline tenderness there are no lesions noted. there is no CVA tenderness Musculoskeletal: No lower extremity tenderness, no upper extremity tenderness. No joint effusions, no DVT signs strong distal pulses no edema Neurologic:  Normal speech and language. No gross focal neurologic deficits are appreciated.  Skin:  Skin is warm, dry and intact. No rash noted. Psychiatric: Mood and affect are normal. Speech and behavior are normal.  ____________________________________________   LABS (all labs ordered are listed, but only abnormal results are displayed)  Labs Reviewed  URINALYSIS, COMPLETE (UACMP) WITH MICROSCOPIC  POCT PREGNANCY, URINE  POC URINE PREG, ED    Pertinent labs  results that were available during my care of the patient were reviewed by me and considered in my medical decision making (see chart for details). ____________________________________________  EKG  I personally interpreted any EKGs ordered by me or triage  ____________________________________________  RADIOLOGY  Pertinent labs & imaging results that were available during my care of the patient were reviewed by me and considered in my medical decision making (see chart for details). If possible, patient and/or family made aware of any abnormal findings. ____________________________________________    PROCEDURES  Procedure(s) performed: None  Procedures  Critical Care performed: None  ____________________________________________   INITIAL IMPRESSION / ASSESSMENT AND PLAN / ED COURSE  Pertinent labs & imaging results that were available during my  care of the patient were reviewed by me and considered in my medical decision making (see chart for details).  Patient here with 2 complaints first is that she needs her psychiatric medications refilled, Dr. Rennis Pettyf psychiatry was able to see her, evaluate her she feels she is stable for discharge and he has written for her medications which is very generous of him and we appreciate the consult.  The second is chronic back pain.  There are no red flags indicate the patient has any significant injury/radiculopathy/cauda equina syndrome/referred abdominal pain/abscess/hematoma or other acute pathology requiring imaging or admission.  We will give her nonsteroidal pain meds for her chronic back pain which is worse when she bends over and we will refer her as an outpatient PCP.    ____________________________________________   FINAL CLINICAL IMPRESSION(S) / ED DIAGNOSES  Final diagnoses:  None      This chart was dictated using voice recognition software.  Despite best efforts to proofread,  errors can occur which can change meaning.  Jeanmarie Plant, MD 08/15/17 [phone]

## 2017-08-15 NOTE — ED Notes (Signed)
Pt sitting on bed in nad.  Says her period was supposed to end, but it continued and she has lower back pain. She has no abdominal pain. Says history of back surgery.  She also says she is almost out of her meds.  She had last refill and lost the bottles.

## 2017-08-15 NOTE — Consult Note (Signed)
[facility]   Reason for Consult: Consult for 26 year old woman with a history of bipolar disorder.  She is here in the emergency room for her back pain but also complains of needing refills of her medicine Referring Physician:Mc Shane Patient Identification: Margret ChanceShanikqua Diane Wallenstein MRN:  [id number] Principal Diagnosis: Bipolar 2 disorder [facility](HCC) Diagnosis:   Patient Active Problem List   Diagnosis Date Noted  . Bipolar 2 disorder (HCC) [F31.81] 08/15/2017  . Tobacco use disorder [F17.200] 07/11/2017  . PTSD (post-traumatic stress disorder) [F43.10] 07/10/2017  . Bipolar 2 disorder, major depressive episode (HCC) [F31.81] 07/08/2017  . Social anxiety disorder [F40.10] 07/08/2017  . Alcohol use disorder, moderate, dependence (HCC) [F10.20] 07/08/2017    Total Time spent with patient: 45 minutes  Subjective:   Margret ChanceShanikqua Diane Solberg is a 26 y.o. female patient admitted with "I need my medicine.  I do not want to be without it.".  HPI: Patient seen and interviewed.  26 year old woman with a history of bipolar disorder.  She was here in the hospital during September and was discharged on medication for her bipolar disorder with a plan to follow up at [facility].  Patient states that she has been taking her medicine consistently since discharge but that a few days ago she moved to a new residence and in the move she lost her recent bottles of medicine.  Patient is feeling anxious because she is worried that she will regress with more mood symptoms if she runs out of her medicine.  She is currently denying depression.  Denies racing thoughts or mania.  Denies psychotic symptoms.  Denies suicidal or homicidal ideation.  Patient states that she had been increasing the lamotrigine as recommended and was up to 50 mg of it.  Other prescriptions are at the same dosage.  Social history: Patient says she is working and in a transition but is anxious about keeping her job.  Recent  move.  Medical history: Patient is here in the hospital for back pain of unclear etiology still to be worked up.  Other than mental health she does not typically have ongoing medical problems  Substance abuse history: Patient says she has not been drinking recently.  Does have a past history of alcohol misuse  Past Psychiatric History: Patient has a history of previous hospitalizations has had suicidal threats in the past.  Has had some psychotic symptoms.  Had a fairly lengthy hospitalization in September but eventually stabilized on a combination of medicine.  Also a history of PTSD.  Seems to be tolerating current medicine well.  Risk to Self: Is patient at risk for suicide?: No Risk to Others:   Prior Inpatient Therapy:   Prior Outpatient Therapy:    Past Medical History:  Past Medical History:  Diagnosis Date  . Anxiety   . Depression   . Genital herpes   . HPV (human papilloma virus) anogenital infection     Past Surgical History:  Procedure Laterality Date  . BACK SURGERY  2011   scoliosis repair  . DG SCOLIOSIS SERIES ([facility] HX)     Family History: No family history on file. Family Psychiatric  History: Denies any Social History:  History  Alcohol Use  . Yes    Comment: weekend     History  Drug Use No    Social History   Social History  . Marital status: Single    Spouse name: N/A  . Number of children: N/A  . Years of education: N/A  Social History Main Topics  . Smoking status: Former Smoker    Years: 4.00  . Smokeless tobacco: Never Used  . Alcohol use Yes     Comment: weekend  . Drug use: No  . Sexual activity: Yes    Birth control/ protection: Implant   Other Topics Concern  . None   Social History Narrative  . None   Additional Social History:    Allergies:  No Known Allergies  Labs: No results found for this or any previous visit (from the past 48 hour(s)).  No current facility-administered medications for this encounter.     Current Outpatient Prescriptions  Medication Sig Dispense Refill  . citalopram (CELEXA) 40 MG tablet Take 1 tablet (40 mg total) by mouth daily. 30 tablet 1  . lamoTRIgine (LAMICTAL) 100 MG tablet Take 1 tablet (100 mg total) by mouth at bedtime. Take 1 tab for 2 weeks, 2 tabs for 2 weeks, 4 tabs for 2 weeks, 8 tabs or 200 mg after. 30 tablet 1  . prazosin (MINIPRESS) 1 MG capsule Take 1 capsule (1 mg total) by mouth 2 (two) times daily. 60 capsule 1  . risperiDONE (RISPERDAL) 2 MG tablet Take 1 tablet (2 mg total) by mouth at bedtime. 30 tablet 1    Musculoskeletal: Strength & Muscle Tone: within normal limits Gait & Station: normal Patient leans: N/A  Psychiatric Specialty Exam: Physical Exam  Nursing note and vitals reviewed. Constitutional: She appears well-developed and well-nourished.  HENT:  Head: Normocephalic and atraumatic.  Eyes: Pupils are equal, round, and reactive to light. Conjunctivae are normal.  Neck: Normal range of motion.  Cardiovascular: Regular rhythm and normal heart sounds.   Respiratory: Effort normal. No respiratory distress.  GI: Soft.  Musculoskeletal: Normal range of motion.  Neurological: She is alert.  Skin: Skin is warm and dry.  Psychiatric: Her speech is normal and behavior is normal. Judgment and thought content normal. Her mood appears anxious. Cognition and memory are normal.    Review of Systems  Constitutional: Negative.   HENT: Negative.   Eyes: Negative.   Respiratory: Negative.   Cardiovascular: Negative.   Gastrointestinal: Negative.   Musculoskeletal: Positive for back pain.  Skin: Negative.   Neurological: Negative.   Psychiatric/Behavioral: Negative.     Blood pressure 135/84, pulse 97, temperature 98.8 F (37.1 C), temperature source Oral, resp. rate 20, height 5\' 4"  (1.626 m), weight 148 lb (67.1 kg), last menstrual period 08/15/2017, SpO2 100 %.Body mass index is 25.4 kg/m.  General Appearance: Fairly Groomed  Eye  Contact:  Good  Speech:  Clear and Coherent  Volume:  Normal  Mood:  Anxious  Affect:  Appropriate  Thought Process:  Goal Directed  Orientation:  Full (Time, Place, and Person)  Thought Content:  Logical  Suicidal Thoughts:  No  Homicidal Thoughts:  No  Memory:  Immediate;   Good Recent;   Fair Remote;   Fair  Judgement:  Good  Insight:  Good  Psychomotor Activity:  Normal  Concentration:  Concentration: Good  Recall:  Good  Fund of Knowledge:  Good  Language:  Good  Akathisia:  No  Handed:  Right  AIMS (if indicated):     Assets:  Communication Skills Desire for Improvement Housing Physical Health Resilience  ADL's:  Intact  Cognition:  WNL  Sleep:        Treatment Plan Summary: Medication management and Plan 26 year old woman with a history of bipolar disorder currently stable without any new complaints.  None of the prescriptions that she was taking were controlled substances or medicines liable to be abused.  She clearly had benefited from her medicine.  I see no harm in giving her replacement prescriptions.  Prescriptions written for Risperdal, prazosin citalopram and lamotrigine.  The only alteration was I increased the lamotrigine dose to 100 mg based on the titration schedule.  I have also requested TTS to talk with the patient.  She says that she went to [facility] for follow-up as recommended but was told that they could not see her because of their current funding situation.  She needs outpatient follow-up referral.  Case reviewed with emergency room doctor.  No indication for Hospital psychiatric level treatment or inpatient admission or IVC  Disposition: No evidence of imminent risk to self or others at present.   Patient does not meet criteria for psychiatric inpatient admission. Supportive therapy provided about ongoing stressors.  Mordecai Rasmussen, MD 08/15/2017 11:37 AM
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
## 2017-08-15 NOTE — ED Triage Notes (Signed)
Pt reports got a refill of her 4 medications a couple of days ago, moved to a new location and has misplaced them. Pt states, "I need to see Dr. Demetrius CharityP to get my medications refilled, this is the only way I know". Pt states, "I don't want to go without my medication". Pt also reports some back pain to her lower back. Pt also reports has been on her menstrual cycle for the past month.

## 2017-09-04 ENCOUNTER — Telehealth: Payer: Self-pay | Admitting: Pharmacy Technician

## 2017-09-04 NOTE — Telephone Encounter (Signed)
[facility] filled initial prescription.  Patient was given new patient packet.  Patient never returned information or scheduled and eligibility appointment.  [facility] unable to provide additional medication assistance until eligibility is determined.  Lateef Juncaj J. Toye Rouillard Care Manager [facility] 

## 2017-11-22 LAB — HIV ANTIBODY (ROUTINE TESTING W REFLEX): HIV 1&2 Ab, 4th Generation: NEGATIVE

## 2017-12-23 LAB — HM PAP SMEAR: HM Pap smear: NEGATIVE

## 2018-07-18 ENCOUNTER — Encounter (HOSPITAL_COMMUNITY): Payer: Self-pay | Admitting: *Deleted

## 2018-07-18 ENCOUNTER — Emergency Department (HOSPITAL_COMMUNITY)
Admission: EM | Admit: 2018-07-18 | Discharge: 2018-07-18 | Disposition: A | Discharge status: Home / Self Care | Payer: 59 | Attending: Emergency Medicine | Admitting: Emergency Medicine

## 2018-07-18 DIAGNOSIS — Z79899 Other long term (current) drug therapy: Secondary | ICD-10-CM | POA: Insufficient documentation

## 2018-07-18 DIAGNOSIS — Z87891 Personal history of nicotine dependence: Secondary | ICD-10-CM | POA: Insufficient documentation

## 2018-07-18 DIAGNOSIS — F419 Anxiety disorder, unspecified: Secondary | ICD-10-CM | POA: Insufficient documentation

## 2018-07-18 DIAGNOSIS — F329 Major depressive disorder, single episode, unspecified: Secondary | ICD-10-CM | POA: Insufficient documentation

## 2018-07-18 DIAGNOSIS — F32A Depression, unspecified: Secondary | ICD-10-CM

## 2018-07-18 DIAGNOSIS — F1010 Alcohol abuse, uncomplicated: Secondary | ICD-10-CM

## 2018-07-18 MED ORDER — CITALOPRAM HYDROBROMIDE 40 MG PO TABS: 40.0000 mg | ORAL_TABLET | Freq: Every day | ORAL | 0 refills | Status: DC

## 2018-07-18 MED ORDER — PRAZOSIN HCL 1 MG PO CAPS: 1.0000 mg | ORAL_CAPSULE | Freq: Two times a day (BID) | ORAL | 0 refills | Status: DC

## 2018-07-18 MED ORDER — RISPERIDONE 2 MG PO TABS: 2.0000 mg | ORAL_TABLET | Freq: Every day | ORAL | 0 refills | Status: DC

## 2018-07-18 MED ORDER — LAMOTRIGINE 25 MG PO TABS: 25.0000 mg | ORAL_TABLET | Freq: Every day | ORAL | 0 refills | Status: DC

## 2018-07-18 NOTE — ED Provider Notes (Signed)
[facility]   CSN: [id number] Arrival date & time: 07/18/18  1038     History   Chief Complaint Chief Complaint  Patient presents with  . Depression  . Anxiety    HPI Victoria [name] is a 27 y.o. female.  HPI   27 year old female with depression and anxiety.  History of the same.  She reports that it is worsening over the past several weeks.  She reports a lot of stress at work.  She has two jobs.  She also reports that she recently broke up with her boyfriend and had to move back in with her mother.  She denies any suicidal ideation "but I do not want to get to that point."  No HI.  Denies hallucinations.  She drinks on a daily basis and this has been escalating.  "I don't even drink white liquor but the other day I had a firth of it."  She reports prior psychiatric admission.  She is requesting prescriptions for medications she was previously on.  She states that she stopped taking them secondary to financial constraints.  She says that she now has insurance though and would like to restart them.  She reports that she has not followed up with psychiatry or a counselor since shortly after psychiatric hospitalization in September of last year.  Past Medical History:  Diagnosis Date  . Anxiety   . Depression   . Genital herpes   . HPV (human papilloma virus) anogenital infection     Patient Active Problem List   Diagnosis Date Noted  . Bipolar 2 disorder (HCC) 08/15/2017  . Tobacco use disorder 07/11/2017  . PTSD (post-traumatic stress disorder) 07/10/2017  . Bipolar 2 disorder, major depressive episode (HCC) 07/08/2017  . Social anxiety disorder 07/08/2017  . Alcohol use disorder, moderate, dependence (HCC) 07/08/2017    Past Surgical History:  Procedure Laterality Date  . BACK SURGERY  2011   scoliosis repair  . DG SCOLIOSIS SERIES ([facility] HX)       OB History   None      Home Medications    Prior to  Admission medications   Medication Sig Start Date End Date Taking? Authorizing Provider  promethazine (PHENERGAN) 12.5 MG tablet Take 12.5 mg by mouth daily as needed for nausea/vomiting. 07/15/18  Yes [provider]  rizatriptan (MAXALT-MLT) 5 MG disintegrating tablet Take 5 mg by mouth daily as needed for migraine. 07/15/18  Yes [provider]  venlafaxine XR (EFFEXOR-XR) 37.5 MG 24 hr capsule Take 1 capsule by mouth daily. 07/15/18  Yes [provider]  citalopram (CELEXA) 40 MG tablet Take 1 tablet (40 mg total) by mouth daily. Patient not taking: Reported on 07/18/2018 08/15/17   [name], [name], MD  lamoTRIgine (LAMICTAL) 100 MG tablet Take 1 tablet (100 mg total) by mouth at bedtime. Take 1 tab for 2 weeks, 2 tabs for 2 weeks, 4 tabs for 2 weeks, 8 tabs or 200 mg after. Patient not taking: Reported on 07/18/2018 08/15/17   [name], [name], MD  prazosin (MINIPRESS) 1 MG capsule Take 1 capsule (1 mg total) by mouth 2 (two) times daily. Patient not taking: Reported on 07/18/2018 08/15/17   [name], [name], MD  risperiDONE (RISPERDAL) 2 MG tablet Take 1 tablet (2 mg total) by mouth at bedtime. Patient not taking: Reported on 07/18/2018 08/15/17   [name], [name], MD  sertraline (ZOLOFT) 50 MG tablet Take 50 mg by mouth daily.  12/27/11  [provider]    Family History No family history on file.  Social History Social History   Tobacco Use  . Smoking status: Former Smoker    Years: 4.00  . Smokeless tobacco: Never Used  Substance Use Topics  . Alcohol use: Yes    Comment: weekend  . Drug use: No     Allergies   Patient has no known allergies.   Review of Systems Review of Systems All systems reviewed and negative, other than as noted in HPI.   Physical Exam Updated Vital Signs BP 124/84   Pulse (!) 116   Temp 98.9 F (37.2 C) (Oral)   Resp 18   SpO2 98%   Physical Exam  Constitutional: She appears well-developed and  well-nourished. No distress.  HENT:  Head: Normocephalic and atraumatic.  Eyes: Conjunctivae are normal. Right eye exhibits no discharge. Left eye exhibits no discharge.  Neck: Neck supple.  Cardiovascular: Regular rhythm and normal heart sounds. Exam reveals no gallop and no friction rub.  No murmur heard. Mild tachycardia  Pulmonary/Chest: Effort normal and breath sounds normal. No respiratory distress.  Abdominal: Soft. She exhibits no distension. There is no tenderness.  Musculoskeletal: She exhibits no edema or tenderness.  Neurological: She is alert.  Skin: Skin is warm and dry.  Psychiatric:  Fair eye contact. Depressed mood. Speech clear. Content appropriate. Doesn't appear to be responding to internal stimuli.   Nursing note and vitals reviewed.    ED Treatments / Results  Labs (all labs ordered are listed, but only abnormal results are displayed) Labs Reviewed - No data to display  EKG None  Radiology No results found.  Procedures Procedures (including critical care time)  Medications Ordered in ED Medications - No data to display   Initial Impression / Assessment and Plan / ED Course  I have reviewed the triage vital signs and the nursing notes.  Pertinent labs & imaging results that were available during my care of the patient were reviewed by me and considered in my medical decision making (see chart for details).     26yF with depression/anxiety. No SI/HI or psychosis. She is requesting medications she was previously on. Explained I would give her prescription for a week worth of meds based on psychiatry recommendations during admit last year, but this is really outside the scope of emergency medicine and should be done under the care of a psychiatric and/or counselor.   She is frustrated because she now has insurance and has a week off from one of her jobs so she wants everything addressed right now. I explained these are ongoing issues which there is not  a quick fix for. Will provide her a list of resources. She needs to stop drinking as well. She seems to think it's not a significant issue because she can function at work but she drinks daily and reports difficulty sleeping.  Final Clinical Impressions(s) / ED Diagnoses   Final diagnoses:  Anxiety  Depression, unspecified depression type  Alcohol abuse    ED Discharge Orders    None       Raeford Razor, MD 07/18/18 1120

## 2018-07-18 NOTE — ED Triage Notes (Signed)
Pt states she feels anxious and depressed. Pt states she would like to be put back on the medication she took last year for similar symptoms. Pt states she felt like she should have stayed on the medications. Pt denies SI.

## 2018-07-18 NOTE — Progress Notes (Signed)
07/18/18  1110  MD in to see patient. Per MD he's planning to discharge with rx.

## 2019-05-06 ENCOUNTER — Other Ambulatory Visit: Payer: Self-pay

## 2019-05-06 ENCOUNTER — Ambulatory Visit (LOCAL_COMMUNITY_HEALTH_CENTER): Payer: Self-pay

## 2019-05-06 VITALS — BP 121/81 | Ht 63.0 in | Wt 148.0 lb

## 2019-05-06 DIAGNOSIS — Z113 Encounter for screening for infections with a predominantly sexual mode of transmission: Secondary | ICD-10-CM

## 2019-05-06 DIAGNOSIS — Z3009 Encounter for other general counseling and advice on contraception: Secondary | ICD-10-CM

## 2019-05-06 DIAGNOSIS — Z3042 Encounter for surveillance of injectable contraceptive: Secondary | ICD-10-CM

## 2019-05-06 DIAGNOSIS — Z30013 Encounter for initial prescription of injectable contraceptive: Secondary | ICD-10-CM

## 2019-05-06 LAB — WET PREP FOR TRICH, YEAST, CLUE
Trichomonas Exam: NEGATIVE
Yeast Exam: NEGATIVE

## 2019-05-06 MED ORDER — MEDROXYPROGESTERONE ACETATE 150 MG/ML IM SUSP
150.0000 mg | Freq: Once | INTRAMUSCULAR | Status: AC
  Administered 2019-05-06: 150 mg via INTRAMUSCULAR

## 2019-05-06 NOTE — Progress Notes (Signed)
Wet mount reviewed, no tx per standing order. Provider orders completed. 

## 2019-05-06 NOTE — Progress Notes (Signed)
Family Planning Visit- Repeat Yearly Visit  Subjective:  Victoria [name] is a 28 y.o. being seen today for an well woman visit and to discuss family planning options.    She is currently using Depo-Provera injections for pregnancy prevention. Patient reports she does not if she or her partner wants a pregnancy in the next year. Patient has the following medical conditionshas Bipolar 2 disorder, major depressive episode (HCC); Social anxiety disorder; Alcohol use disorder, moderate, dependence (HCC); PTSD (post-traumatic stress disorder); Tobacco use disorder; and Bipolar 2 disorder (HCC) on their problem list.  Chief Complaint  Patient presents with  . Exposure to STD  . Contraception    Depo    Patient reports Than she is due for Depo 05/04/2019.  She is having itching x 2 wks.  Client is on menses today.  She denies STD exposure. Client isn't taking meds at this time d/t no health insurance.  She has been on meds for HTN and mood disorders.  She feels that she needs her meds and counseling.  Patient denies vag. Disch, rash/lesion, abd pain, pain with sex, etc  Does the patient desire a pregnancy in the next year? (OKQ flowsheet)  See flowsheet for other program required questions.   Body mass index is 26.22 kg/m. - Patient is eligible for diabetes screening based on BMI and age 85>40?  no HA1C ordered? no  Patient reports 1 of partner in last year. Desires STI screening?  Yes  Does the patient have a current or past history of drug use? No   No components found for: HCV]   Health Maintenance Due  Topic Date Due  . TETANUS/TDAP  08/26/2011    ROS  The following portions of the patient's history were reviewed and updated as appropriate: allergies, current medications, past family history, past medical history, past social history, past surgical history and problem list. Problem list updated.  Objective:   Vitals:   05/06/19 1020  BP: 121/81  Weight: 148 lb (67.1 kg)   Height: 5\' 3"  (1.6 m)    Physical Exam Constitutional:      Appearance: Normal appearance.  Abdominal:     Palpations: Abdomen is soft.     Tenderness: There is no abdominal tenderness.  Genitourinary:    General: Normal vulva.     Comments: menstral flow noted,  Bimanual not indicated Skin:    Findings: No lesion or rash.  Neurological:     Mental Status: She is alert.   Assessment/Plan Depo today Treat wet prep as per SO.  Co client to return to clinic if sympts continue after menses resolve Client needs PCP resource information-plans to contact for appt.    Assessment and Plan:  Victoria [name] is a 28 y.o. female [facility] an  Revisit family planning visit and STD screening. Depo Provera 150 mg Im q 11-13 wks x 1 yr. Treat wet prep as per SO.  Co. If sympts persist after menses to return for evaluation. Needs PCP resources information-  Client agrees to contact clinic for appt.   Contraception counseling: Reviewed all forms of birth control options available including abstinence; over the counter/barrier methods; hormonal contraceptive medication including pill, patch, ring, injection,contraceptive implant; hormonal and nonhormonal IUDs; permanent sterilization options including vasectomy and the various tubal sterilization modalities. Risks and benefits reviewed.  Questions were answered.  Written information was also given to the patient to review.  Patient desires  depo, this was prescribed for  patient. She will follow up in  11-13 wks for surveillance.  She was told to call with any further questions, or with any concerns about this method of contraception.  Emphasized use of condoms 100% of the time for STI prevention.  There are no diagnoses linked to this encounter.    No follow-ups on file.  No future appointments.  Hassell Done, FNP

## 2019-06-02 MED ORDER — MEDROXYPROGESTERONE ACETATE 150 MG/ML IM SUSP: 150.0000 mg | Freq: Once | INTRAMUSCULAR | Status: DC

## 2019-06-02 NOTE — Addendum Note (Signed)
Addended by: Hassell Done on: 06/02/2019 04:12 PM   Modules accepted: Orders

## 2019-06-05 ENCOUNTER — Encounter (HOSPITAL_COMMUNITY): Payer: Self-pay

## 2019-06-05 ENCOUNTER — Ambulatory Visit (INDEPENDENT_AMBULATORY_CARE_PROVIDER_SITE_OTHER): Payer: Self-pay

## 2019-06-05 ENCOUNTER — Other Ambulatory Visit: Payer: Self-pay

## 2019-06-05 ENCOUNTER — Ambulatory Visit (HOSPITAL_COMMUNITY)
Admission: EM | Admit: 2019-06-05 | Discharge: 2019-06-05 | Disposition: A | Discharge status: Home / Self Care | Payer: Self-pay | Attending: Urgent Care | Admitting: Urgent Care

## 2019-06-05 DIAGNOSIS — M25521 Pain in right elbow: Secondary | ICD-10-CM

## 2019-06-05 DIAGNOSIS — R2231 Localized swelling, mass and lump, right upper limb: Secondary | ICD-10-CM

## 2019-06-05 DIAGNOSIS — M79601 Pain in right arm: Secondary | ICD-10-CM

## 2019-06-05 DIAGNOSIS — M79641 Pain in right hand: Secondary | ICD-10-CM

## 2019-06-05 MED ORDER — NAPROXEN 500 MG PO TABS: 500.0000 mg | ORAL_TABLET | Freq: Two times a day (BID) | ORAL | 0 refills | Status: DC

## 2019-06-05 MED ORDER — METHYLPREDNISOLONE SODIUM SUCC 125 MG IJ SOLR: 125.0000 mg | Freq: Once | INTRAMUSCULAR | Status: DC

## 2019-06-05 MED ORDER — METHYLPREDNISOLONE SODIUM SUCC 125 MG IJ SOLR
INTRAMUSCULAR | Status: AC
  Filled 2019-06-05: qty 2

## 2019-06-05 NOTE — ED Triage Notes (Signed)
Pt presents with complaints of pain and numbness/tingling in her right arm from her elbow down to her hand with intermittent numbness x 2 weeks. Denies any known injury.

## 2019-06-05 NOTE — Discharge Instructions (Addendum)
Wait 3 to 5 days before starting naproxen.  This is a NSAID, a class of medicines that can help with pain and inflammation.  It is similar to Motrin, Advil but stronger.  I do recommend that you use naproxen by itself and not together with over-the-counter NSAIDs like Motrin or Advil.  Please make sure you reach out to [facility] internal medicine to establish care and continue work-up on your hand pain and swelling.

## 2019-06-05 NOTE — ED Provider Notes (Signed)
MRN: [id number] DOB: 1991-06-29  Subjective:   Victoria [name] is a 29 y.o. female presenting for 2-week history of acute onset worsening severe constant right hand pain and swelling now having numbness and tingling, weakness of said hand.  She is also having right elbow pain.  Has a history of bipolar disorder and is not currently taking any medications with musculoskeletal side effects.  She does have a sister that has arthritis of both of her knees and she is only 27 years old.    Current Facility-Administered Medications:  .  medroxyPROGESTERone (DEPO-PROVERA) injection 150 mg, 150 mg, Intramuscular, Once, Victoria Done, FNP No current outpatient medications on file.    No Known Allergies   Past Medical History:  Diagnosis Date  . Anxiety   . Depression   . Genital herpes   . HPV (human papilloma virus) anogenital infection   . Hypertension 06/22/2017     Past Surgical History:  Procedure Laterality Date  . BACK SURGERY  2011   scoliosis repair  . DG SCOLIOSIS SERIES ([facility] HX)      ROS  Objective:   Vitals: BP (!) 119/99   Pulse 85   Temp 98.8 F (37.1 C)   Resp 18   SpO2 98%   Physical Exam Constitutional:      General: She is not in acute distress.    Appearance: Normal appearance. She is well-developed. She is not ill-appearing.  HENT:     Head: Normocephalic and atraumatic.     Nose: Nose normal.     Mouth/Throat:     Mouth: Mucous membranes are moist.     Pharynx: Oropharynx is clear.  Eyes:     General: No scleral icterus.    Extraocular Movements: Extraocular movements intact.     Pupils: Pupils are equal, round, and reactive to light.  Cardiovascular:     Rate and Rhythm: Normal rate.  Pulmonary:     Effort: Pulmonary effort is normal.  Musculoskeletal:     Right elbow: She exhibits decreased range of motion. She exhibits no swelling, no effusion, no deformity and no laceration. Tenderness found. Olecranon process tenderness noted. No  radial head, no medial epicondyle and no lateral epicondyle tenderness noted.     Right wrist: She exhibits normal range of motion, no tenderness, no bony tenderness, no swelling, no effusion, no crepitus, no deformity and no laceration.     Right forearm: She exhibits no tenderness, no bony tenderness, no swelling, no edema, no deformity and no laceration.     Right hand: She exhibits decreased range of motion, tenderness (Generalized worse over metacarpals and second and third fingers), bony tenderness and swelling (1+ extending into second and third fingers). She exhibits normal capillary refill, no deformity and no laceration. Normal sensation noted. Decreased strength (Seems due to her pain) noted.  Skin:    General: Skin is warm and dry.  Neurological:     General: No focal deficit present.     Mental Status: She is alert and oriented to person, place, and time.  Psychiatric:        Mood and Affect: Mood normal.        Behavior: Behavior normal.    Dg Hand Complete Right  Result Date: 06/05/2019 CLINICAL DATA:  Per pt: every day for the past two weeks, when waking up in the morning, right lower arm and hand is numb, tingling, this morning the right hand was swollen. No injury. The entire right hand and digits are  visibly swollen. EXAM: RIGHT HAND - COMPLETE 3+ VIEW COMPARISON:  None. FINDINGS: There is no evidence of fracture or dislocation. There is no evidence of arthropathy or other focal bone abnormality. Soft tissues are unremarkable. IMPRESSION: Negative. Electronically Signed   By: Corlis Leak  Victoria M.D.   On: 06/05/2019 13:08    Assessment and Plan :   1. Right arm pain   2. Right hand pain   3. Localized swelling on right hand   4. Right elbow pain     Will use aggressive management with Solu-Medrol injection in clinic today, naproxen in a few days.  Counseled patient on nature of inflammatory conditions and recommended she establish care with [facility] internal medicine to continue  work-up of her hand swelling and pain.  In the meantime provided her with a work note for work restrictions of light duty only. Counseled patient on potential for adverse effects with medications prescribed/recommended today, ER and return-to-clinic precautions discussed, patient verbalized understanding.    Victoria [name], Victoria Reid, PA-C 06/05/19 1320

## 2019-06-09 ENCOUNTER — Ambulatory Visit: Payer: Self-pay

## 2019-06-10 ENCOUNTER — Ambulatory Visit: Payer: Self-pay | Admitting: Internal Medicine

## 2019-06-10 ENCOUNTER — Other Ambulatory Visit: Payer: Self-pay

## 2019-06-10 ENCOUNTER — Encounter: Payer: Self-pay | Admitting: Internal Medicine

## 2019-06-10 VITALS — BP 120/71 | HR 106 | Temp 98.4°F | Ht 64.0 in | Wt 143.7 lb

## 2019-06-10 DIAGNOSIS — O26899 Other specified pregnancy related conditions, unspecified trimester: Secondary | ICD-10-CM | POA: Insufficient documentation

## 2019-06-10 DIAGNOSIS — G5601 Carpal tunnel syndrome, right upper limb: Secondary | ICD-10-CM | POA: Insufficient documentation

## 2019-06-10 HISTORY — DX: Carpal tunnel syndrome, right upper limb: G56.01

## 2019-06-10 MED ORDER — GABAPENTIN 100 MG PO CAPS: 100.0000 mg | ORAL_CAPSULE | Freq: Every day | ORAL | 1 refills | Status: DC

## 2019-06-10 NOTE — Progress Notes (Signed)
   CC: right hand pain   HPI:  [name] is a 28 y.o. RHD female who presents for 3 week history of right hand numbness/tingling that radiates into her elbow. Symptoms are worse at night. 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 Endorses she has had similar symptoms intermittently in the past, but never this severe or persistent.  She presented to an urgent care on 8/14. Right hand x-ray was unremarkable. She received a glucocorticoid injection and was instructed to take Naproxen. She states the injection improved the associated swelling she had in her hand, but no change in numbness/tingling.   She denies fevers, chills, chest pain, shortness of breath, joint pain, numbness/tingling in other extremities, rashes, tick exposure, STI exposure, n/v, changes in bowel movements, urinary symptoms.     Past Medical History:  Diagnosis Date  . Anxiety   . Depression   . Genital herpes   . HPV (human papilloma virus) anogenital infection   . Hypertension 06/22/2017   Past Surgical History:  Procedure Laterality Date  . BACK SURGERY  2011   scoliosis repair  . DG SCOLIOSIS SERIES ([place] HX)     Family history: positive for DM, HTN on both sides of her family; negative for autoimmune or rheumatologic diseases  Social: works in Proof[employer], smokes 1/2 ppd, drinks 2-3 beers per day, no illicit substance use.    Review of Systems:  Per HPI   Physical Exam:  Vitals:   06/10/19 0909  Weight: 143 lb 11.2 oz (65.2 kg)  Height: 5\' 4"  (1.626 m)   General: alert, pleasant female, appears stated age, NAD HEENT: conjunctiva normal  CV: Tachycardic, normal rhythm; no m/r/g Pulm: normal work of breathing; lungs CTAB MSK: right hand and wrist without swelling or deformity. She has some palpable tenderness and tightness throughout right extensor muscles. Good ROM in elbows and wrists bilaterally. Strength and sensory  intact. Negative Phalen's, Tinel's.  Neuro: A&Ox3; no focal deficits; normal gait  Skin: no rashes or skin breakdown  Psych: appropriate mood and affect    Assessment & Plan:   See Encounters Tab for problem based charting.  Patient discussed with Dr. Dareen Piano

## 2019-06-10 NOTE — Patient Instructions (Addendum)
Ms. Victoria [name], It was great meeting you! Today we discussed your right hand pain and numbness which is most likely due to carpal tunnel syndrome. The best treatment for this splinting, particularly at night. You can purchase a brace at a pharmacy or medical supply store. You can also wear it throughout the day at work to help with symptoms. Continue taking NSAIDs such as Ibuprofen or Aleve. I will prescribe a nerve medication to help you get rest at night.   If your symptoms are not improving after 3-4 weeks, please let us know and we will plan to refer you to a specialist.   I have written a work note for you to return on 06/12/19 without restrictions.  Take care! Dr. Koleen Distance  Carpal Tunnel Syndrome  Carpal tunnel syndrome is a condition that causes pain in your hand and arm. The carpal tunnel is a narrow area located on the palm side of your wrist. Repeated wrist motion or certain diseases may cause swelling within the tunnel. This swelling pinches the main nerve in the wrist (median nerve). What are the causes? This condition may be caused by:  Repeated wrist motions.  Wrist injuries.  Arthritis.  A cyst or tumor in the carpal tunnel.  Fluid buildup during pregnancy. Sometimes the cause of this condition is not known. What increases the risk? The following factors may make you more likely to develop this condition:  Having a job, such as being a Research scientist (life sciences), that requires you to repeatedly move your wrist in the same motion.  Being a woman.  Having certain conditions, such as: ? Diabetes. ? Obesity. ? An underactive thyroid (hypothyroidism). ? Kidney failure. What are the signs or symptoms? Symptoms of this condition include:  A tingling feeling in your fingers, especially in your thumb, index, and middle fingers.  Tingling or numbness in your hand.  An aching feeling in your entire arm, especially when your wrist and elbow are bent for a long time.  Wrist pain  that goes up your arm to your shoulder.  Pain that goes down into your palm or fingers.  A weak feeling in your hands. You may have trouble grabbing and holding items. Your symptoms may feel worse during the night. How is this diagnosed? This condition is diagnosed with a medical history and physical exam. You may also have tests, including:  Electromyogram (EMG). This test measures electrical signals sent by your nerves into the muscles.  Nerve conduction study. This test measures how well electrical signals pass through your nerves.  Imaging tests, such as X-rays, ultrasound, and MRI. These tests check for possible causes of your condition. How is this treated? This condition may be treated with:  Lifestyle changes. It is important to stop or change the activity that caused your condition.  Doing exercise and activities to strengthen your muscles and bones (physical therapy).  Learning how to use your hand again after diagnosis (occupational therapy).  Medicines for pain and inflammation. This may include medicine that is injected into your wrist.  A wrist splint.  Surgery. Follow these instructions at home: If you have a splint:  Wear the splint as told by your health care provider. Remove it only as told by your health care provider.  Loosen the splint if your fingers tingle, become numb, or turn cold and blue.  Keep the splint clean.  If the splint is not waterproof: ? Do not let it get wet. ? Cover it with a watertight covering  when you take a bath or shower. Managing pain, stiffness, and swelling   If directed, put ice on the painful area: ? If you have a removable splint, remove it as told by your health care provider. ? Put ice in a plastic bag. ? Place a towel between your skin and the bag. ? Leave the ice on for 20 minutes, 2-3 times per day. General instructions  Take over-the-counter and prescription medicines only as told by your health care  provider.  Rest your wrist from any activity that may be causing your pain. If your condition is work related, talk with your employer about changes that can be made, such as getting a wrist pad to use while typing.  Do any exercises as told by your health care provider, physical therapist, or occupational therapist.  Keep all follow-up visits as told by your health care provider. This is important. Contact a health care provider if:  You have new symptoms.  Your pain is not controlled with medicines.  Your symptoms get worse. Get help right away if:  You have severe numbness or tingling in your wrist or hand. Summary  Carpal tunnel syndrome is a condition that causes pain in your hand and arm.  It is usually caused by repeated wrist motions.  Lifestyle changes and medicines are used to treat carpal tunnel syndrome. Surgery may be recommended.  Follow your health care provider's instructions about wearing a splint, resting from activity, keeping follow-up visits, and calling for help. This information is not intended to replace advice given to you by your health care provider. Make sure you discuss any questions you have with your health care provider. Document Released: 10/05/2000 Document Revised: 02/14/2018 Document Reviewed: 02/14/2018 Elsevier Patient Education  2020 [employer]

## 2019-06-10 NOTE — Assessment & Plan Note (Signed)
Patient presents for several week history of persistent numbness/tingling in right hand that will radiate into elbow as well. Symptoms are worse at night. Complains of associated subjective weakness. Physical exam reveals numbness/tingling with opposition of right thumb. Otherwise, unremarkable. Presentation consistent with carapl tunnel syndrome. She has already received glucocorticoid injection at urgent care visit on 8/14. Will continue conservative management with nocturnal splinting and NSAIDs. Rx also given for Gabapentin to take qhs to see if she gets better relief at night.  Follow-up in 3-4 weeks if symptoms are not improving.

## 2019-06-11 NOTE — Progress Notes (Signed)
Internal Medicine Clinic Attending  Case discussed with Dr. Bloomfield at the time of the visit.  We reviewed the resident's history and exam and pertinent patient test results.  I agree with the assessment, diagnosis, and plan of care documented in the resident's note.  

## 2019-07-08 ENCOUNTER — Ambulatory Visit: Payer: Self-pay

## 2019-07-21 ENCOUNTER — Other Ambulatory Visit: Payer: Self-pay

## 2019-07-21 ENCOUNTER — Ambulatory Visit (LOCAL_COMMUNITY_HEALTH_CENTER): Payer: Self-pay

## 2019-07-21 VITALS — BP 128/85 | Ht 63.0 in | Wt 139.5 lb

## 2019-07-21 DIAGNOSIS — Z3009 Encounter for other general counseling and advice on contraception: Secondary | ICD-10-CM

## 2019-07-21 DIAGNOSIS — Z30013 Encounter for initial prescription of injectable contraceptive: Secondary | ICD-10-CM

## 2019-07-21 MED ORDER — MEDROXYPROGESTERONE ACETATE 150 MG/ML IM SUSP
150.0000 mg | Freq: Once | INTRAMUSCULAR | Status: AC
  Administered 2019-07-21: 150 mg via INTRAMUSCULAR

## 2019-07-21 NOTE — Progress Notes (Signed)
Last physical at [facility] 01/30/2018. Per [facility] records, PAP in March 2019 was Normal and negative HPV; Reflex PAP due 2022, and CBE due 2020. Last depo at [facility] 05/06/2019; 10.6 weeks post depo. Consulted with provider regarding pt request for depo today; per verbal order by Hassell Done, FNP, administered DMPA 150 mg IM today.

## 2019-08-24 ENCOUNTER — Ambulatory Visit: Payer: Self-pay

## 2019-09-21 ENCOUNTER — Telehealth: Payer: Self-pay | Admitting: Family Medicine

## 2019-09-21 NOTE — Telephone Encounter (Signed)
This RN returned pt's call. Pt reports that she is still having constant menstraul bleeding/ spotting since before she got her last Depo 07/21/2019. Pt reports flow is not heavy. Pt states she has tried Ibuprofen but that did not help. Consulted with Charlotte Sanes, PA-C who recommends pt come in to be seen. FP Prob visit scheduled for 09/23/2019 at 9:40am per pt request. Pt aware of appt date and time and to arrive early for check-in. Pt with no other questions or concerns at this time.Ronny Bacon, RN

## 2019-09-21 NOTE — Telephone Encounter (Signed)
Patient wants to talk to provider/nurse due to bleeding for two months.

## 2019-09-22 ENCOUNTER — Ambulatory Visit: Payer: Self-pay

## 2019-09-22 NOTE — Telephone Encounter (Signed)
Spoke with pt by phone, advised to come in for further evaluation.

## 2019-09-22 NOTE — Telephone Encounter (Signed)
Patient has appt 09/22/19 and 09/23/19 Aileen Fass, RN

## 2019-09-23 ENCOUNTER — Ambulatory Visit: Payer: Self-pay

## 2019-10-06 ENCOUNTER — Ambulatory Visit (LOCAL_COMMUNITY_HEALTH_CENTER): Payer: Self-pay | Admitting: Family Medicine

## 2019-10-06 ENCOUNTER — Encounter: Payer: Self-pay | Admitting: Family Medicine

## 2019-10-06 ENCOUNTER — Other Ambulatory Visit: Payer: Self-pay

## 2019-10-06 VITALS — BP 129/90 | Ht 64.0 in | Wt 138.4 lb

## 2019-10-06 DIAGNOSIS — Z3009 Encounter for other general counseling and advice on contraception: Secondary | ICD-10-CM

## 2019-10-06 DIAGNOSIS — Z30011 Encounter for initial prescription of contraceptive pills: Secondary | ICD-10-CM

## 2019-10-06 DIAGNOSIS — Z113 Encounter for screening for infections with a predominantly sexual mode of transmission: Secondary | ICD-10-CM

## 2019-10-06 MED ORDER — NORGESTIMATE-ETH ESTRADIOL 0.25-35 MG-MCG PO TABS: 1.0000 | ORAL_TABLET | Freq: Every day | ORAL | 0 refills | Status: DC

## 2019-10-06 NOTE — Progress Notes (Signed)
Patient here to start OC. Had Depo 07/21/2019, and states she has been bleeding since then. She wants to stop taking Depo and start OC to get bleeding under control and to use as BCM. See phone note from 09/21/2019.Marland KitchenJenetta Downer, RN

## 2019-10-06 NOTE — Progress Notes (Signed)
Wet mount reviewed, no treatment indicated. Patient given 4 packs Sprintec and told to call for PE and more pills when she starts pack #3.Marland KitchenJenetta Downer, RN

## 2019-10-06 NOTE — Progress Notes (Signed)
Contraception/Family Planning VISIT ENCOUNTER NOTE  Subjective:   Victoria [name] is a 28 y.o. G26 female here for reproductive life counseling.  Desires to change Depo to OCPs..  Reports she does not want a pregnancy in the next year. Denies abnormal discharge, pelvic pain, problems with intercourse or other gynecologic concerns. States that she has been bleeding for 2 months from her Depo. She is @ 11 weeks since last Depo She wants to change to OCPs.  Client would like to have testing for possible BV.  States she was seen at [facility] last week for STD testing.  She states that she didn't have BV.  She states that she still smells a fishy odor- questions if it is from the bleeding.  Gynecologic History No LMP recorded (lmp unknown). Patient has had an injection. Contraception: Depo-Provera injections  Health Maintenance Due  Topic Date Due  . TETANUS/TDAP  08/26/2011  . INFLUENZA VACCINE  05/23/2019   The following portions of the patient's history were reviewed and updated as appropriate: allergies, current medications, past family history, past medical history, past social history, past surgical history and problem list.  Review of Systems Pertinent items are noted in HPI.   Objective:  BP 132/88   Ht 5\' 4"  (1.626 m)   Wt 138 lb 6.4 oz (62.8 kg)   LMP  (LMP Unknown) Comment: states she has been bleeding for 2 months  BMI 23.76 kg/m  Gen: well appearing, NAD  PELVIC: Normal appearing external genitalia; normal appearing vaginal mucosa and cervix.  Moderate amount of bloody discharge noted.  Bimanual-uterus normal size, non-tender; Ovaries- no masses, non-tender  Assessment and Plan:   Contraception counseling: Reviewed all forms of birth control options in the tiered based approach. available including abstinence; over the counter/barrier methods; hormonal contraceptive medication including pill, patch, ring, injection,contraceptive implant; hormonal and nonhormonal IUDs;  permanent sterilization options including vasectomy and the various tubal sterilization modalities. Risks, benefits, and typical effectiveness rates were reviewed.  Questions were answered.  Patient desires Sprintec, this was prescribed for patient. She will follow up in  2-3 months  for surveillance and her annual exam/pap.  She was told to call with any further questions, or with any concerns about this method of contraception.  Emphasized use of condoms 100% of the time for STI prevention.  ECP counseling was not given - see RN documentation  1. General counseling and advice on contraceptive management -Co client that she needs to make an annual appointment in for either Feb/March/2021 to have a pap and continue with her OCPs -Rechecked BP 129/90 2. OCP (oral contraceptive pills) initiation - norgestimate-ethinyl estradiol (ORTHO-CYCLEN) 0.25-35 MG-MCG tablet; Take 1 tablet by mouth daily.  Dispense: 4 Package; Refill: 0 Co to use condoms x 1 week for back-up. 3. Screening examination for venereal disease - WET PREP FOR [place], YEAST, CLUE  -Client declines GC/Chlamydia, HIV and RPR Co client that the bloody disch may be causing the "fishy" odor.  Her wet prep was negative.  Suggested she could use Rephresh OTC when bleeding stops to adjust pH.  No follow-ups on file.  Hassell Done, [place]

## 2019-10-07 LAB — WET PREP FOR TRICH, YEAST, CLUE
Trichomonas Exam: NEGATIVE
Yeast Exam: NEGATIVE

## 2019-11-20 ENCOUNTER — Telehealth: Payer: Self-pay | Admitting: Family Medicine

## 2019-11-20 NOTE — Telephone Encounter (Signed)
patient would like to speak to a nurse regarding her bc and bleeding issues

## 2019-11-23 NOTE — Telephone Encounter (Signed)
TC from patient.  States is taking OCPs and still having bleeding.  Last Depo in September. No missed or late pills.  Provider appt scheduled. Richmond Campbell, RN

## 2019-11-25 ENCOUNTER — Ambulatory Visit: Payer: Self-pay

## 2019-12-17 ENCOUNTER — Ambulatory Visit: Payer: Self-pay

## 2020-03-24 ENCOUNTER — Other Ambulatory Visit: Payer: Self-pay

## 2020-03-24 ENCOUNTER — Ambulatory Visit (INDEPENDENT_AMBULATORY_CARE_PROVIDER_SITE_OTHER): Payer: No Payment, Other | Admitting: Licensed Clinical Social Worker

## 2020-03-24 ENCOUNTER — Telehealth (INDEPENDENT_AMBULATORY_CARE_PROVIDER_SITE_OTHER): Payer: No Payment, Other | Admitting: Psychiatry

## 2020-03-24 ENCOUNTER — Encounter (HOSPITAL_COMMUNITY): Payer: Self-pay | Admitting: Psychiatry

## 2020-03-24 DIAGNOSIS — F4010 Social phobia, unspecified: Secondary | ICD-10-CM | POA: Diagnosis not present

## 2020-03-24 DIAGNOSIS — F259 Schizoaffective disorder, unspecified: Secondary | ICD-10-CM

## 2020-03-24 DIAGNOSIS — F4310 Post-traumatic stress disorder, unspecified: Secondary | ICD-10-CM

## 2020-03-24 DIAGNOSIS — F3181 Bipolar II disorder: Secondary | ICD-10-CM | POA: Diagnosis not present

## 2020-03-24 DIAGNOSIS — F250 Schizoaffective disorder, bipolar type: Secondary | ICD-10-CM | POA: Insufficient documentation

## 2020-03-24 MED ORDER — CARIPRAZINE HCL 1.5 MG PO CAPS: 1.5000 mg | ORAL_CAPSULE | Freq: Every day | ORAL | 0 refills | Status: DC

## 2020-03-24 MED ORDER — TRAZODONE HCL 50 MG PO TABS: 50.0000 mg | ORAL_TABLET | Freq: Every evening | ORAL | 1 refills | Status: DC | PRN

## 2020-03-24 NOTE — Patient Instructions (Signed)
Pt will cont with therapy and medication management. Therapy will cont 2 times monthly or bi weekly

## 2020-03-24 NOTE — Progress Notes (Addendum)
Comprehensive Clinical Assessment (CCA) Note  03/24/2020 Victoria [name] [id number]   Virtual Visit via Video Note  I connected with Victoria [name] on 03/24/20 at  8:00 AM EDT by a video enabled telemedicine application and verified that I am speaking with the correct person using two identifiers.  Location: Patient: Victoria [name]  Provider: [facility]    I discussed the limitations of evaluation and management by telemedicine and the availability of in person appointments. The patient expressed understanding and agreed to proceed.  History of Present Illness: Schizoaffective disorder     Observations/Objective: Pt was alert and oriented x 5. Well groomed and very engaging in therapy. Pt present affect was anxious and nervous as evidence by her reported paranoia. Rapid speech was present in pt.     Assessment and Plan: Pt will cont with therapy every two weeks and follow medication management as advised by psychiatrist. Pt Therapy plan will be to decrease pt symptoms of paranoia. Pt reports she is compulsive when it comes to job retention and paranoid to the point of taking multiple routes home because she feels people are following her.    Follow Up Instructions:    I discussed the assessment and treatment plan with the patient. The patient was provided an opportunity to ask questions and all were answered. The patient agreed with the plan and demonstrated an understanding of the instructions.   The patient was advised to call back or seek an in-person evaluation if the symptoms worsen or if the condition fails to improve as anticipated.  I provided 60 minutes of non-face-to-face time during this encounter.   Dory Horn, LCSW  Visit Diagnosis:      ICD-10-CM   1. Schizoaffective disorder, unspecified type (Victoria [name])  F25.9   2. Bipolar 2 disorder (Victoria [name])  F31.81   3. Bipolar 2 disorder, major depressive episode (Victoria [name])  F31.81   4. PTSD (post-traumatic stress  disorder)  F43.10   5. Social anxiety disorder  F40.10       CCA Screening, Triage and Referral (STR)  Patient Reported Information How did you hear about [place]? Other (Comment)  Referral name: [facility]   Whom do you see for routine medical problems? I don't have a doctor   What Do You Feel Would Help You the Most Today? Therapy;Medication   Have You Recently Been in Any Inpatient Treatment (Hospital/Detox/Crisis Center/28-Day Program)? No (Hx 2 years ago)   Have You Ever Received Services From [employer] Before? Yes   Have You Recently Had Any Thoughts About Hurting Yourself? No  Are You Planning to Commit Suicide/Harm Yourself At This time? No   Have you Recently Had Thoughts About [place]? No   Have You Used Any Alcohol or Drugs in the Past 24 Hours? No  How Long Ago Did You Use Drugs or Alcohol? Still uses weekly  What Did You Use and How Much? 2-3 drinks when needed  Do You Currently Have a Therapist/Psychiatrist? Yes  Name of Therapist/Psychiatrist: Dr. Toy Care  Have You Been Recently Discharged From Any Office Practice or Programs? No     CCA Screening Triage Referral Assessment Type of Contact: Tele-Assessment  Is this Initial or Reassessment? Reassessment  Date Telepsych consult ordered in [facility]:  No data recorded Time Telepsych consult ordered in [facility]:  No data recorded  Patient Reported Information Reviewed? Yes  Patient Left Without Being Seen? No data recorded Reason for Not Completing Assessment: No data recorded  Collateral Involvement: No  data recorded  Does Patient Have a Court Appointed Legal Guardian? No data recorded Name and Contact of Legal Guardian: No data recorded If Minor and Not Living with Parent(s), Who has Custody? No data recorded Is CPS involved or ever been involved? Never  Is APS involved or ever been involved? Never   Patient Determined To Be At Risk for Harm To Self or Others Based on Review of Patient  Reported Information or Presenting Complaint? No  Method: No data recorded Availability of Means: No data recorded Intent: No data recorded Notification Required: No data recorded Additional Information for Danger to Others Potential: No data recorded Additional Comments for Danger to Others Potential: No data recorded Are There Guns or Other Weapons in Your Home? No data recorded Types of Guns/Weapons: No data recorded Are These Weapons Safely Secured?                            No data recorded Who Could Verify You Are Able To Have These Secured: No data recorded Do You Have any Outstanding Charges, Pending Court Dates, Parole/Probation? No data recorded Contacted To Inform of Risk of Harm To Self or Others: No data recorded  Location of Assessment: No data recorded  Does Patient Present under Involuntary Commitment? No  IVC Papers Initial File Date: No data recorded  [place] of Residence: No data recorded  Patient Currently Receiving the Following Services: No data recorded  Determination of Need: No data recorded  Options For Referral: Medication Management   CCA Biopsychosocial  Intake/Chief Complaint:  CCA Intake With Chief Complaint Chief Complaint/Presenting Problem: Bipolar 2, Social anxiety, depression, Patient's Currently Reported Symptoms/Problems: P[place]oia, rapid speach,  Mental Health Symptoms Depression:  Depression: Change in energy/activity  Mania:  Mania: Change in energy/activity, Increased Energy, Racing thoughts  Anxiety:      Psychosis:     Trauma:  Trauma: Detachment from others, Irritability/anger  Obsessions:     Compulsions:     Inattention:     Hyperactivity/Impulsivity:  Hyperactivity/Impulsivity: Feeling of restlessness  Oppositional/Defiant Behaviors:     Emotional Irregularity:     Other Mood/Personality Symptoms:      Mental Status Exam Appearance and self-care  Stature:  Stature: Average  Weight:  Weight: Average weight  Clothing:   Clothing: Casual  Grooming:  Grooming: Normal  Cosmetic use:  Cosmetic Use: Age appropriate  Posture/gait:  Posture/Gait: Normal  Motor activity:  Motor Activity: Restless  Sensorium  Attention:     Concentration:  Concentration: Scattered  Orientation:  Orientation: X5  Recall/memory:  Recall/Memory: Normal  Affect and Mood  Affect:  Affect: Anxious  Mood:  Mood: Angry, Anxious, Depressed  Relating  Eye contact:  Eye Contact: Normal  Facial expression:     Attitude toward examiner:  Attitude Toward Examiner: Cooperative, Guarded  Thought and Language  Speech flow:    Thought content:     Preoccupation:     Hallucinations:     Organization:     Transport planner of Knowledge:  Fund of Knowledge: Average  Intelligence:  Intelligence: Average  Abstraction:     Judgement:     Art therapist:     Insight:  Insight: Fair  Decision Making:  Decision Making: Impulsive  Social Functioning  Social Maturity:  Social Maturity: Isolates  Social Judgement:  Social Judgement: Normal  Stress  Stressors:  Stressors: Work, Relationship, School  Coping Ability:  Coping Ability: English as a second language teacher Deficits:  Skill Deficits: Decision making  Supports:  Supports: Family     Exercise/Diet: Exercise/Diet Do You Exercise?: No Have You Gained or Lost A Significant Amount of Weight in the Past Six Months?: Yes-Gained Do You Follow a Special Diet?: No Do You Have Any Trouble Sleeping?: Yes Explanation of Sleeping Difficulties: taking meltonine over the counter   CCA Employment/Education  Employment/Work Situation: Employment / Work Situation Employment situation: Employed 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 How long has patient been employed?: 2 months Patient's job has been impacted by current illness: Yes Describe how patient's job has been impacted: Pt states she gets paranoid and then will quit jobs impulsively Has patient ever been in the [employer]?:  No  Education: Education Is Patient Currently Attending School?: Yes Did Teacher, adult education From [employer]?: Yes Did Physicist, medical?: Yes Did Heritage manager?: No Did You Have An Individualized Education Program (IIEP): No Did You Have Any Difficulty At [employer]?: No Patient's Education Has Been Impacted by Current Illness: Yes     CCA Substance Use  Alcohol/Drug Use: Alcohol / Drug Use Pain Medications: n/a Prescriptions: n/a History of alcohol / drug use?: Yes(Pt will drink 2 glasses of wine or multiple beers daily if anxious) Substance #1 Name of Substance 1: alcohol                       DSM5 Diagnoses: Patient Active Problem List   Diagnosis Date Noted  . Schizoaffective disorder ([place]) 03/24/2020  . Carpal tunnel syndrome of right wrist 06/10/2019  . Bipolar 2 disorder ([place]) 08/15/2017  . Tobacco use disorder 07/11/2017  . PTSD (post-traumatic stress disorder) 07/10/2017  . Bipolar 2 disorder, major depressive episode ([place]) 07/08/2017  . Social anxiety disorder 07/08/2017  . Alcohol use disorder, moderate, dependence ([place]) 07/08/2017   Client is a 28 year old female. Client is referred by [facility]    Client states mental health symptoms as evidenced by paranoia, rapid speech, compulsive.    Client denies suicidal and homicidal ideations   Client was screened for the following SDOH: Financial and work related SDOH are barriers for pt as Paranoia and Manic episodes have cause job termination in the past. Pt was currently in a state of paranoia due to manic episode and felt like quitting her job. This was discussed with MD to provide options for pt or work related notes   Assessment Information that integrates subjective and objective details with a therapist's professional interpretation:  SW met with    Client meets criteria for schizoaffective due to paranoia  Client states use of the following substances. Alcohol as needed 2 to 3 time weekly 2  to 3 drinks per episode.  Therapist addressed concern of alcohol mixed with medications currently taken. Pt states that it is under control but will reach out if needed for further assessment and treatment    Treatment recommendations are include plan Therapy and medication management     Clinician assisted client with scheduling the following appointments: 2 weeks out.    Client was in agreement with treatment recommendations.  Patient Centered Plan: Patient is on the following Treatment Plan(s): To improve coping skills and not be impulsive in regards to her jobTo improve coping skills and not be impulsive in regards to her job. Goal would be to have behavior present 2 to 3 times weekly and then to gradually decrease as goals are met.      Referrals to Alternative Service(s):  Referred to Alternative Service(s):   Place:   Date:   Time:    Referred to Alternative Service(s):   Place:   Date:   Time:    Referred to Alternative Service(s):   Place:   Date:   Time:    Referred to Alternative Service(s):   Place:   Date:   Time:     Dory Horn

## 2020-03-24 NOTE — Progress Notes (Signed)
Psychiatric Initial Adult Assessment   Virtual Visit via Video Note  I connected with Victoria [name] on 03/24/20 at  9:00 AM EDT by a video enabled telemedicine application and verified that I am speaking with the correct person using two identifiers.  Location: Patient: Home Provider: Clinic   I discussed the limitations of evaluation and management by telemedicine and the availability of in person appointments. The patient expressed understanding and agreed to proceed.  I provided 35 minutes of non-face-to-face time during this encounter.     Patient Identification: Victoria [name] MRN:  [id number] Date of Evaluation:  03/24/2020   Referral Source: Beverly Sessions  Chief Complaint:   " I am feeling paranoid as hell."  Visit Diagnosis:    ICD-10-CM   1. Schizoaffective disorder, unspecified type ([place])  F25.9 traZODone (DESYREL) 50 MG tablet    History of Present Illness: This is a 29 year old female with history of mood disorder and psychotic symptoms now seen for psychiatric evaluation.  Patient reported that she was under the care of [facility] for the past couple of years and was prescribed several different medications however she has never been fully satisfied with them.  She stated that the medications did not help her achieve remission. She informed that she has been prescribed Lamictal 50 mg at bedtime, Abilify 10 mg at bedtime, hydroxyzine 25 mg as needed, prazosin 1 mg at bedtime, Zoloft 25 mg at bedtime. She reported that for the past few weeks she has been taking only half the doses as she was worried about running out of the medications due to change of clinic providers.  She informed that she has been feeling paranoid for quite some time now.  Even when she was taking full doses of these medication she never noticed significant improvement in her paranoia.  She stated that she feels worried and scared about others being there to harm her.  She is always paranoid about  other people against her.  She does not trust anyone including the people that she works with at her job and also her mother.  She avoids everyone and stays away from them.  She stated that at times she will worry about somebody following her car and as result would take a longer out so that she can get the car off her back.  She is paranoid about somebody planting cameras in her house and recording her all the time.  She is worried about somebody following her whenever she goes out of her home.  She also reported having mood swings, irritability.  She frequent gets upset and is also easily agitated at times.  She any current or past suicidal or homicidal ideations.  She also reported having frequent panic attacks during which she feels very anxious and her heart races very fast and she cannot catch her breath.   Past Psychiatric History: Mood disorder, has been on medications on and off over the past 3-4 years. Was seeing Beverly Sessions and was also seeing [place] [facility] prior to that.  Previous Psychotropic Medications: Yes . Has also taken Risperidone and Trazodone in the past  Substance Abuse History in the last 12 months:  No.  Consequences of Substance Abuse: NA  Past Medical History:  Past Medical History:  Diagnosis Date  . Anxiety   . Depression   . Genital herpes   . HPV (human papilloma virus) anogenital infection   . Hypertension 06/22/2017    Past Surgical History:  Procedure Laterality Date  . BACK SURGERY  2011   scoliosis repair  . DG SCOLIOSIS SERIES ([facility] HX)      Family Psychiatric History: denied  Family History:  Family History  Problem Relation Age of Onset  . Healthy Mother   . Healthy Father     Social History:   Social History   Socioeconomic History  . Marital status: Single    Spouse name: Not on file  . Number of children: Not on file  . Years of education: Not on file  . Highest education level: Not on file  Occupational History  . Not on  file  Tobacco Use  . Smoking status: Current Every Day Smoker    Packs/day: 0.50    Years: 9.00    Pack years: 4.50    Types: Cigarettes  . Smokeless tobacco: Never Used  Substance and Sexual Activity  . Alcohol use: Yes    Comment: Socially.  . Drug use: No  . Sexual activity: Yes    Birth control/protection: Implant  Other Topics Concern  . Not on file  Social History Narrative  . Not on file   Social Determinants of Health   Financial Resource Strain:   . Difficulty of Paying Living Expenses:   Food Insecurity:   . Worried About Programme researcher, broadcasting/film/video in the Last Year:   . Barista in the Last Year:   Transportation Needs:   . Freight forwarder (Medical):   Marland Kitchen Lack of Transportation (Non-Medical):   Physical Activity:   . Days of Exercise per Week:   . Minutes of Exercise per Session:   Stress:   . Feeling of Stress :   Social Connections:   . Frequency of Communication with Friends and Family:   . Frequency of Social Gatherings with Friends and Family:   . Attends Religious Services:   . Active Member of Clubs or Organizations:   . 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  Allergies:  No Known Allergies  Metabolic Disorder Labs: Lab Results  Component Value Date   HGBA1C 5.3 07/09/2017   MPG 105.41 07/09/2017   No results found for: PROLACTIN Lab Results  Component Value Date   CHOL 285 (H) 07/09/2017   TRIG 124 07/09/2017   HDL 56 07/09/2017   CHOLHDL 5.1 07/09/2017   VLDL 25 07/09/2017   LDLCALC 204 (H) 07/09/2017   Lab Results  Component Value Date   TSH 1.421 07/09/2017    Therapeutic Level Labs: No results found for: LITHIUM No results found for: CBMZ No results found for: VALPROATE  Current Medications: Current Outpatient Medications  Medication Sig Dispense Refill  . Biotin w/ Vitamins C & E (HAIR SKIN & NAILS GUMMIES PO) Take 2 tablets by  mouth. 2 gummies    . gabapentin (NEURONTIN) 100 MG capsule Take 1 capsule (100 mg total) by mouth at bedtime. (Patient not taking: Reported on 07/21/2019) 30 capsule 1  . naproxen (NAPROSYN) 500 MG tablet Take 1 tablet (500 mg total) by mouth 2 (two) times daily. (Patient not taking: Reported on 07/21/2019) 30 tablet 0  . norgestimate-ethinyl estradiol (ORTHO-CYCLEN) 0.25-35 MG-MCG tablet Take 1 tablet by mouth daily. 4 Package 0  . traZODone (DESYREL) 50 MG tablet Take 1 tablet (50 mg total) by mouth at bedtime as needed for sleep. 30 tablet 1   Current Facility-Administered Medications  Medication Dose Route Frequency Provider Last Rate Last Admin  .  medroxyPROGESTERone (DEPO-PROVERA) injection 150 mg  150 mg Intramuscular Once Larene Pickett, FNP         Psychiatric Specialty Exam: Review of Systems  There were no vitals taken for this visit.There is no height or weight on file to calculate BMI.  General Appearance: Fairly Groomed  Eye Contact:  Good  Speech:  Clear and Coherent and Normal Rate  Volume:  Normal  Mood:  Anxious  Affect:  Congruent  Thought Process:  Goal Directed and Descriptions of Associations: Intact  Orientation:  Full (Time, Place, and Person)  Thought Content:  Logical, Delusions, Ideas of Reference:   Paranoia, Paranoid Ideation and Rumination  Suicidal Thoughts:  No  Homicidal Thoughts:  No  Memory:  Immediate;   Good Recent;   Good  Judgement:  Fair  Insight:  Fair  Psychomotor Activity:  Normal  Concentration:  Concentration: Good and Attention Span: Good  Recall:  Good  Fund of Knowledge:Good  Language: Good  Akathisia:  Negative  Handed:  Right  AIMS (if indicated): not done  Assets:  Communication Skills Desire for Improvement Financial Resources/Insurance Housing Physical Health  ADL's:  Intact  Cognition: WNL  Sleep:  Poor   Screenings: AIMS     Admission (Discharged) from 07/08/2017 in [facility] INPATIENT BE[facility]AVIORAL MEDICINE  AIMS Total  Score  0    AUDIT     Admission (Discharged) from 07/08/2017 in [facility] INPATIENT BE[facility]AL MEDICINE  Alcohol Use Disorder Identification Test Final Score (AUDIT)  17    CAGE-AID     Counselor from 03/24/2020 in [facility]  CAGE-AID Score  3    PHQ2-9     Office Visit from 06/10/2019 in [facility]  PHQ-2 Total Score  0      Assessment and Plan: Patient is displaying acute psychotic symptoms and needs immediate stabilization.  Patient has not found Abilify to be effective in controlling her paranoid ideations.  She wants to try different medication.  She was offered Wellsite geologist. Potential side effects of medication and risks vs benefits of treatment vs non-treatment were explained and discussed. All questions were answered. Patient was agreeable to trying Vraylar samples, she was provided with the address so that she can come to pick up the samples.  She was agreeable to retrying trazodone to help her with insomnia.   1. Schizoaffective disorder, unspecified type (HCC)  -Restart traZODone (DESYREL) 50 MG tablet; Take 1 tablet (50 mg total) by mouth at bedtime as needed for sleep.  Dispense: 30 tablet; Refill: 1 -Start cariprazine (VRAYLAR) capsule; Take 1 capsule (1.5 mg total) by mouth daily.  Dispense: 30 capsule; Refill: 0.  Samples provided.  F/up in 1 week for close monitoring.   Zena Amos, MD 6/3/20219:31 AM

## 2020-03-28 ENCOUNTER — Telehealth (HOSPITAL_COMMUNITY): Payer: Self-pay | Admitting: Psychiatry

## 2020-03-31 ENCOUNTER — Telehealth (INDEPENDENT_AMBULATORY_CARE_PROVIDER_SITE_OTHER): Payer: No Payment, Other | Admitting: Psychiatry

## 2020-03-31 ENCOUNTER — Encounter (HOSPITAL_COMMUNITY): Payer: Self-pay | Admitting: Psychiatry

## 2020-03-31 ENCOUNTER — Other Ambulatory Visit: Payer: Self-pay

## 2020-03-31 DIAGNOSIS — F419 Anxiety disorder, unspecified: Secondary | ICD-10-CM | POA: Diagnosis not present

## 2020-03-31 DIAGNOSIS — F259 Schizoaffective disorder, unspecified: Secondary | ICD-10-CM | POA: Diagnosis not present

## 2020-03-31 DIAGNOSIS — F411 Generalized anxiety disorder: Secondary | ICD-10-CM | POA: Insufficient documentation

## 2020-03-31 MED ORDER — TRAZODONE HCL 50 MG PO TABS: 50.0000 mg | ORAL_TABLET | Freq: Every evening | ORAL | 1 refills | Status: DC | PRN

## 2020-03-31 MED ORDER — BUSPIRONE HCL 10 MG PO TABS: 10.0000 mg | ORAL_TABLET | Freq: Two times a day (BID) | ORAL | 1 refills | Status: DC

## 2020-03-31 NOTE — Progress Notes (Signed)
[place] MD/PA/NP OP Progress Note  Virtual Visit via Video Note  I connected with Victoria [name] on 03/31/20 at  2:30 PM EDT by a video enabled telemedicine application and verified that I am speaking with the correct person using two identifiers.  Location: Patient: Home Provider: Clinic   I discussed the limitations of evaluation and management by telemedicine and the availability of in person appointments. The patient expressed understanding and agreed to proceed.  I provided 19 minutes of non-face-to-face time during this encounter.     03/31/2020 3:00 PM ZSWFUXNAT Latrina Guttman  MRN:  [id number]  Chief Complaint:  " My mood is much better."  HPI: Patient was seen a week ago for acute psychotic symptoms.  She was provided with samples for Vraylar on the same day which she picked up herself.  Patient reported that she started taking Vraylar and over the past few days she has noticed that she is not as irritable as she used to be.  She has noted that she is not as paranoid and is able to stay calm when things get annoying.  She also has noticed significant improvement in her sleep after starting trazodone.  She denied any hallucinations. She also reported that she has noticed that Vraylar does not make her sleepy and as a result she can carry out her daily routine like usual.  She denied noticing any side effects to [employer]. She wants to continue the same regimen however she also requested that she is prescribed something for anxiety.  She stated that she still feels quite anxious and has episodes of panic attacks.  She described a panic attack episodes as.  The time when her heart races very fast and she has a hard time catching her breath.  She has noticed increased sweating with impending sensation of doom.   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   Visit Diagnosis:    ICD-10-CM   1. Schizoaffective disorder, unspecified type ([place])  F25.9   2. Anxiety  F41.9     Past Psychiatric History: Mood disorder  Past Medical History:  Past Medical History:  Diagnosis Date   Anxiety    Depression    Genital herpes    HPV (human papilloma virus) anogenital infection    Hypertension 06/22/2017    Past Surgical History:  Procedure Laterality Date   BACK SURGERY  2011   scoliosis repair   DG SCOLIOSIS SERIES ([place] HX)      Family Psychiatric History: denied  Family History:  Family History  Problem Relation Age of Onset   Healthy Mother    Healthy Father     Social History:  Social History   Socioeconomic History   Marital status: Single    Spouse name: Not on file   Number of children: Not on file   Years of education: Not on file   Highest education level: Not on file  Occupational History   Not on file  Tobacco Use   Smoking status: Current Every Day Smoker    Packs/day: 0.50    Years: 9.00    Pack years: 4.50    Types: Cigarettes   Smokeless tobacco: Never Used  Substance and Sexual Activity   Alcohol use: Yes    Comment: Socially.   Drug use: No   Sexual activity: Yes    Birth control/protection:  Implant  Other Topics Concern   Not on file  Social History Narrative   Not on file   Social Determinants of Health   Financial Resource Strain:    Difficulty of Paying Living Expenses:   Food Insecurity:    Worried About Programme researcher, broadcasting/film/video in the Last Year:    Barista in the Last Year:   Transportation Needs:    Freight forwarder (Medical):    Lack of Transportation (Non-Medical):   Physical Activity:    Days of Exercise per Week:    Minutes of Exercise per Session:   Stress:    Feeling of Stress :   Social Connections:    Frequency of Communication with Friends and Family:    Frequency of Social Gatherings with Friends and Family:    Attends  Religious Services:    Active Member of Clubs or Organizations:    Attends Banker Meetings:    Marital Status:     Allergies: No Known Allergies  Metabolic Disorder Labs: Lab Results  Component Value Date   HGBA1C 5.3 07/09/2017   MPG 105.41 07/09/2017   No results found for: PROLACTIN Lab Results  Component Value Date   CHOL 285 (H) 07/09/2017   TRIG 124 07/09/2017   HDL 56 07/09/2017   CHOLHDL 5.1 07/09/2017   VLDL 25 07/09/2017   LDLCALC 204 (H) 07/09/2017   Lab Results  Component Value Date   TSH 1.421 07/09/2017    Therapeutic Level Labs: No results found for: LITHIUM No results found for: VALPROATE No components found for:  CBMZ  Current Medications: Current Outpatient Medications  Medication Sig Dispense Refill   Biotin w/ Vitamins C & E (HAIR SKIN & NAILS GUMMIES PO) Take 2 tablets by mouth. 2 gummies     cariprazine (VRAYLAR) capsule Take 1 capsule (1.5 mg total) by mouth daily. 30 capsule 0   gabapentin (NEURONTIN) 100 MG capsule Take 1 capsule (100 mg total) by mouth at bedtime. (Patient not taking: Reported on 07/21/2019) 30 capsule 1   naproxen (NAPROSYN) 500 MG tablet Take 1 tablet (500 mg total) by mouth 2 (two) times daily. (Patient not taking: Reported on 07/21/2019) 30 tablet 0   norgestimate-ethinyl estradiol (ORTHO-CYCLEN) 0.25-35 MG-MCG tablet Take 1 tablet by mouth daily. 4 Package 0   traZODone (DESYREL) 50 MG tablet Take 1 tablet (50 mg total) by mouth at bedtime as needed for sleep. 30 tablet 1   Current Facility-Administered Medications  Medication Dose Route Frequency Provider Last Rate Last Admin   medroxyPROGESTERone (DEPO-PROVERA) injection 150 mg  150 mg Intramuscular Once Larene Pickett, FNP          Psychiatric Specialty Exam: Review of Systems  There were no vitals taken for this visit.There is no height or weight on file to calculate BMI.  General Appearance: Well Groomed  Eye Contact:  Good  Speech:   Clear and Coherent and Normal Rate  Volume:  Normal  Mood:  Euthymic, not irritable like last visit  Affect:  Appropriate and Congruent  Thought Process:  Goal Directed and Descriptions of Associations: Intact  Orientation:  Full (Time, Place, and Person)  Thought Content: Logical , denied hallucinations, denied delusions  Suicidal Thoughts:  No  Homicidal Thoughts:  No  Memory:  Immediate;   Good Recent;   Good  Judgement:  Fair  Insight:  Fair  Psychomotor Activity:  Normal  Concentration:  Concentration: Good and Attention Span: Good  Recall:  Good  Fund of Knowledge: Good  Language: Good  Akathisia:  Negative  Handed:  Right  AIMS (if indicated): not done  Assets:  Communication Skills Desire for Improvement Financial Resources/Insurance Housing Talents/Skills Transportation Vocational/Educational  ADL's:  Intact  Cognition: WNL  Sleep:  Good with help of Trazodone   Screenings: AIMS     Admission (Discharged) from 07/08/2017 in [facility] INPATIENT BEHAVIORAL MEDICINE  AIMS Total Score 0    AUDIT     Admission (Discharged) from 07/08/2017 in [facility] INPATIENT BEHAVIORAL MEDICINE  Alcohol Use Disorder Identification Test Final Score (AUDIT) 17    CAGE-AID     Counselor from 03/24/2020 in [facility]  CAGE-AID Score 3    PHQ2-9     Office Visit from 06/10/2019 in [facility]  PHQ-2 Total Score 0       Assessment and Plan: Patient has shown remarkable improvement in her psychotic symptoms and mood symptoms after being started on Vraylar a week ago from now.  She is also able to sleep well with the help of trazodone.  She is complaining of significant anxiety and panic attacks and is agreeable to trial of buspirone to target the same. Potential side effects of medication and risks vs benefits of treatment vs non-treatment were explained and discussed. All questions were answered.  1. Schizoaffective disorder, unspecified  type (HCC)  - Continue Vraylar 1.5 mg daily - traZODone (DESYREL) 50 MG tablet; Take 1 tablet (50 mg total) by mouth at bedtime as needed for sleep.  Dispense: 30 tablet; Refill: 1  2. Anxiety  - Start busPIRone (BUSPAR) 10 MG tablet; Take 1 tablet (10 mg total) by mouth 2 (two) times daily.  Dispense: 60 tablet; Refill: 1  F/up in 3 weeks. Continue individual therapy.    Zena Amos, MD 03/31/2020, 3:00 PM

## 2020-04-07 ENCOUNTER — Other Ambulatory Visit: Payer: Self-pay

## 2020-04-07 ENCOUNTER — Ambulatory Visit (INDEPENDENT_AMBULATORY_CARE_PROVIDER_SITE_OTHER): Payer: No Payment, Other | Admitting: Licensed Clinical Social Worker

## 2020-04-07 DIAGNOSIS — F250 Schizoaffective disorder, bipolar type: Secondary | ICD-10-CM

## 2020-04-07 NOTE — Progress Notes (Signed)
   THERAPIST PROGRESS NOTE  Session Time: 52  Participation Level: Active  Behavioral Response: CasualAlertAnxious  Type of Therapy: Individual Therapy  Treatment Goals addressed: Anxiety and Diagnosis: schizoaffective disorder bipolar   Interventions: Supportive  Summary: Victoria [name] is a 29 y.o. female who presents with schizoaffective bipolar type.   Suicidal/Homicidal: Nowithout intent/plan    Plan: Return again in 2 weeks.  Diagnosis: Axis I: Schizoaffective Disorder    Axis II: No diagnosis   Subjective:   Pt has states she is wired could not sleep do to rapid thoughts. Medication that pt is currently taking have been working, did missed a dose. Currently taking over the counter Angeline Slim that she bought to help control anxiety.  Pt Job status, currently on a leave of absence until Monday, but did not quit as pt stated in last session. Pt reports that relationship has been going strong as she was having consistent fights due to previously reported symptoms from initial assessment.     Objective LCSW and pt met face to face. Pt was alert and oriented x 5. Posture upright and engaged.  LCSW notes that pt has a broad affect.        Assessment: Pt had increased positive mood. Present to sessions anxious. States anxiety has increased symptoms include rapid heart rate, tremors, rapid thoughts, poor sleep. Pt did admit that anxiety and paranoia did increase after missing dose of medication cariprazine (VRAYLAR).  Overall medication has improved symptoms. Pt does meet criteria for schizoaffective bipolar type.    Plan: Pt will document anxiety symptoms for f/u appointment with Provider, cont on med as prescribed, then f/u with counselor in 2 weeks    Dory Horn, LCSW 04/07/2020

## 2020-04-07 NOTE — Patient Instructions (Signed)
Managing Schizoaffective Disorder °If you have been diagnosed with schizoaffective disorder (ScAD), you may be relieved to know why you have felt or behaved a certain way. You may also feel overwhelmed about the treatment ahead, how to get the support you need, and how to deal with the condition day-to-day. With care and support, you can learn to manage your symptoms and live with ScAD. °ScAD is a chronic, lifelong condition that may occur in cycles. Periods of severe symptoms may be followed by periods of less severe symptoms or improvement. There are steps you can take to help manage ScAD and make your life better. °How to manage lifestyle changes °Managing stress °Stress is your body's reaction to life changes and events, both good and bad. For people with ScAD, stress can cause more severe symptoms to start (can be a trigger), so it is important to learn ways to deal with stress. Your health care provider, therapist, or counselor may suggest techniques such as: °· Meditation, muscle relaxation, and breathing exercises. °· Music therapy. This can include creating music or listening to music. °· Life skills training. This training is focused on work, self-care, money, house management, and social skills. °Other things you can do to manage stress include: °· Keeping a stress diary. This can help you learn what causes your stress to start and how you can control your response to those triggers. °· Exercising. Even a short daily walk can help. °· Getting enough sleep. °· Making a schedule to manage your time. Knowing what you will do from day to day helps you avoid feeling overwhelmed by tasks and deadlines. °· Spending time on hobbies you enjoy that help you relax. ° °Medicines °Your health care provider is likely to prescribe various types of medicine depending on your symptoms. These may include one or more of the following types: °· Antipsychotics. °· Mood stabilizers. °· Antidepressants. °Make sure you: °· Talk  with your pharmacist or health care provider about all medicines that you take, the possible side effects, and which medicines are safe to take together. °· Make it your goal to take part in all treatment decisions (shared decision-making). Ask about possible side effects of medicines that your health care provider recommends, and tell him or her how you feel about having those side effects. It is best if shared decision-making with your health care provider is part of your total treatment plan. °Relationships °Having the support of your family and friends can play a major role in the success of your treatment. The following steps can help you maintain healthy relationships: °· Think about going to couples therapy, family therapy, or family education classes. °· Create a written plan for your treatment, and include close family members and friends in the process. °· Consider bringing your partner or another family member or friend to the appointments you have with your health care provider. °How to recognize changes in your condition °If you find that your condition is getting worse, talk to your health care provider right away. Watch for these signs: °· Your mood becomes extreme with either emotional highs or the intense lows of depression. °· Your speech becomes unclear. °· You are disorganized, show the wrong social behaviors, or withdraw from social activities. °· You have racing thoughts and have trouble thinking clearly or staying focused. °· You hear, see, taste, and believe things that others do not. °· You have poor personal hygiene, weight gain or weight loss, or changes in how you are sleeping or eating. °  Follow these instructions at home: °· Take over-the-counter and prescription medicines only as told by your health care provider. Do not start new medicines or stop taking medicines before you ask your health care provider if it is safe to make those changes. °· Avoid caffeine, alcohol, and drugs. They  can affect how your medicine works and can make your symptoms worse. °· Eat a healthy diet. °· Look for support groups in your area so you can meet other people with your condition. You can learn new methods of managing ScAD by listening to others. °· Keep all follow-up visits as told by your health care provider, therapist, or counselor. This is important. °Where to find support °Talking to others °· Reach out to trusted friends or family members, explain your condition, and let them know that you are working with a health care team. °· Consider giving educational materials to friends and family. °· If you are having trouble telling your friends and family about your condition, keep in mind that honest and open communication can make these conversations easier. °Finances °Be sure to check with your insurance carrier to find out what treatment options are covered by your plan. You may also be able to find financial assistance through not-for-profit organizations or with local government-based resources. °If you are taking medicines, you may be able to get the generic form, which may be less expensive than brand-name medicine. Some makers of prescription medicines also offer help to patients who cannot afford the medicines that they need. °Therapy and support groups °· Make sure you find a counselor or therapist who is familiar with ScAD. Meet with your counselor or therapist once a week or more often if needed. °· Find support programs for people with ScAD. °Where to find more information °· National Alliance on Mental Illness: www.nami.org °Contact a health care provider if: °· You are not able to take your medicines as prescribed. °· Your symptoms get worse. °Get help right away if: °· You have serious thoughts about hurting yourself or others. °If you ever feel like you may hurt yourself or others, or have thoughts about taking your own life, get help right away. You can go to your nearest emergency department or  call: °· Your local emergency services (911 in the [place] °· A suicide crisis helpline, such as the National Suicide Prevention Lifeline at 1-800-273-8255. This is open 24 hours a day. °Summary °· Schizoaffective disorder (ScAD) is a chronic, lifelong illness. It is best controlled with continuous treatment that includes medicine and therapy. °· Learning ways to manage stress may help your treatment to work better. °· Having the support of your family and friends can be a key to making your treatment a success. °· If you find that your condition is getting worse, talk to your health care provider right away. °This information is not intended to replace advice given to you by your health care provider. Make sure you discuss any questions you have with your health care provider. °Document Revised: 01/30/2019 Document Reviewed: 02/07/2017 °Elsevier Patient Education © 2020 Elsevier Inc. ° °

## 2020-04-11 ENCOUNTER — Telehealth (HOSPITAL_COMMUNITY): Payer: Self-pay | Admitting: *Deleted

## 2020-04-11 NOTE — Telephone Encounter (Signed)
Called stating her anxiety is interfering with her work. She is prescribed Buspar 10 mg qd per Dr Evelene Croon but due to her high level of anxiety she has on her own been taking 10 mg BID without relief. She is asking for direction from here, possibly something else that will benefit her more.

## 2020-04-11 NOTE — Telephone Encounter (Signed)
Spoke with Toy Cookey NP re patients concern with breakthrough anxiety even with her increasing the Buspar to BID. Brittney directed her to increase it to TID. Spoke with her and provided her with the instructions as told to Clinical research associate by NP. She verbalized her understanding.

## 2020-04-14 ENCOUNTER — Ambulatory Visit: Payer: Self-pay

## 2020-04-20 ENCOUNTER — Ambulatory Visit (INDEPENDENT_AMBULATORY_CARE_PROVIDER_SITE_OTHER): Payer: No Payment, Other | Admitting: Licensed Clinical Social Worker

## 2020-04-20 ENCOUNTER — Encounter (HOSPITAL_COMMUNITY): Payer: Self-pay | Admitting: Psychiatry

## 2020-04-20 ENCOUNTER — Other Ambulatory Visit: Payer: Self-pay

## 2020-04-20 ENCOUNTER — Ambulatory Visit (INDEPENDENT_AMBULATORY_CARE_PROVIDER_SITE_OTHER): Payer: No Payment, Other | Admitting: Psychiatry

## 2020-04-20 DIAGNOSIS — F419 Anxiety disorder, unspecified: Secondary | ICD-10-CM | POA: Diagnosis not present

## 2020-04-20 DIAGNOSIS — F250 Schizoaffective disorder, bipolar type: Secondary | ICD-10-CM

## 2020-04-20 DIAGNOSIS — F259 Schizoaffective disorder, unspecified: Secondary | ICD-10-CM

## 2020-04-20 MED ORDER — BUSPIRONE HCL 10 MG PO TABS: 10.0000 mg | ORAL_TABLET | Freq: Two times a day (BID) | ORAL | 1 refills | Status: DC

## 2020-04-20 MED ORDER — CARIPRAZINE HCL 3 MG PO CAPS
3.0000 mg | ORAL_CAPSULE | Freq: Every day | ORAL | 0 refills | Status: DC
Start: 2020-04-20 — End: 2020-05-10

## 2020-04-20 MED ORDER — TRAZODONE HCL 50 MG PO TABS: 50.0000 mg | ORAL_TABLET | Freq: Every evening | ORAL | 1 refills | Status: DC | PRN

## 2020-04-20 MED ORDER — FLUOXETINE HCL 10 MG PO CAPS: 10.0000 mg | ORAL_CAPSULE | Freq: Every day | ORAL | 1 refills | Status: DC

## 2020-04-20 NOTE — Progress Notes (Signed)
BH MD/PA/NP OP Progress Note    04/20/2020 2:14 PM HENIDPOEU Victoria [name]  MRN:  [id number]  Chief Complaint:  " I am feeling very paranoid."  HPI: Patient reported that her paranoid delusions of somebody trying to harm her have returned back. She stated that she is feeling very paranoid and has been wishy-washy about several job opportunities lately. She gave an example of how she has changed jobs one after another after another due to paranoia. 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 She reported that she has been able to sleep well with help of trazodone. She also reported feeling very depressed lately. She feels easily tearful and has been crying a lot. She has been more isolative than usual and is not doing anything other than her routine daily activities. She denies any suicidal or homicidal ideations.  She reported the buspirone has been of some help in controlling her anxiety however she still feels anxious.  Past meds tried: Has taken risperidone in the past, noted excessive weight gain. Has taken Seroquel in the past-made her very tired during the daytime. Took Abilify in the recent past and the dose was titrated up to 15 mg however she did not notice any improvement in her psychotic symptoms. She is now on Vraylar and the dose is being titrated upwards gradually. Lamictal- ineffective Has taken Zoloft in the past did not feel it was helpful. Has also taken a teenager couldn't recall if it helped or not. Celexa- did not find it helpful.   Visit Diagnosis:    ICD-10-CM   1. Schizoaffective disorder, bipolar type (HCC)  F25.0     Past Psychiatric History: Mood disorder  Past Medical History:  Past Medical History:  Diagnosis Date   Anxiety    Depression    Genital herpes    HPV (human papilloma virus) anogenital infection    Hypertension 06/22/2017    Past  Surgical History:  Procedure Laterality Date   BACK SURGERY  2011   scoliosis repair   DG SCOLIOSIS SERIES ([facility] HX)      Family Psychiatric History: denied  Family History:  Family History  Problem Relation Age of Onset   Healthy Mother    Healthy Father     Social History:  Social History   Socioeconomic History   Marital status: Single    Spouse name: Not on file   Number of children: Not on file   Years of education: Not on file   Highest education level: Not on file  Occupational History   Not on file  Tobacco Use   Smoking status: Current Every Day Smoker    Packs/day: 0.50    Years: 9.00    Pack years: 4.50    Types: Cigarettes   Smokeless tobacco: Never Used  Substance and Sexual Activity   Alcohol use: Yes    Comment: Socially.   Drug use: No   Sexual activity: Yes    Birth control/protection: Implant  Other Topics Concern   Not on file  Social History Narrative   Not on file   Social Determinants of Health   Financial Resource Strain:    Difficulty of Paying Living Expenses:   Food Insecurity:    Worried About Programme researcher, broadcasting/film/video in the Last Year:    Ran Out of Food in the Last Year:   Transportation Needs:  Lack of Transportation (Medical):    Lack of Transportation (Non-Medical):   Physical Activity:    Days of Exercise per Week:    Minutes of Exercise per Session:   Stress:    Feeling of Stress :   Social Connections:    Frequency of Communication with Friends and Family:    Frequency of Social Gatherings with Friends and Family:    Attends Religious Services:    Active Member of Clubs or Organizations:    Attends Banker Meetings:    Marital Status:     Allergies: No Known Allergies  Metabolic Disorder Labs: Lab Results  Component Value Date   HGBA1C 5.3 07/09/2017   MPG 105.41 07/09/2017   No results found for: PROLACTIN Lab Results  Component Value Date   CHOL 285 (H)  07/09/2017   TRIG 124 07/09/2017   HDL 56 07/09/2017   CHOLHDL 5.1 07/09/2017   VLDL 25 07/09/2017   LDLCALC 204 (H) 07/09/2017   Lab Results  Component Value Date   TSH 1.421 07/09/2017    Therapeutic Level Labs: No results found for: LITHIUM No results found for: VALPROATE No components found for:  CBMZ  Current Medications: Current Outpatient Medications  Medication Sig Dispense Refill   Biotin w/ Vitamins C & E (HAIR SKIN & NAILS GUMMIES PO) Take 2 tablets by mouth. 2 gummies     busPIRone (BUSPAR) 10 MG tablet Take 1 tablet (10 mg total) by mouth 2 (two) times daily. 60 tablet 1   cariprazine (VRAYLAR) capsule Take 1 capsule (1.5 mg total) by mouth daily. 30 capsule 0   gabapentin (NEURONTIN) 100 MG capsule Take 1 capsule (100 mg total) by mouth at bedtime. (Patient not taking: Reported on 07/21/2019) 30 capsule 1   naproxen (NAPROSYN) 500 MG tablet Take 1 tablet (500 mg total) by mouth 2 (two) times daily. (Patient not taking: Reported on 07/21/2019) 30 tablet 0   norgestimate-ethinyl estradiol (ORTHO-CYCLEN) 0.25-35 MG-MCG tablet Take 1 tablet by mouth daily. 4 Package 0   traZODone (DESYREL) 50 MG tablet Take 1 tablet (50 mg total) by mouth at bedtime as needed for sleep. 30 tablet 1   Current Facility-Administered Medications  Medication Dose Route Frequency Provider Last Rate Last Admin   medroxyPROGESTERone (DEPO-PROVERA) injection 150 mg  150 mg Intramuscular Once Larene Pickett, FNP          Psychiatric Specialty Exam: Review of Systems  Gastrointestinal: Negative.     There were no vitals taken for this visit.There is no height or weight on file to calculate BMI.  General Appearance: Well Groomed  Eye Contact:  Good  Speech:  Clear and Coherent and Normal Rate  Volume:  Normal  Mood:  Depressed  Affect:  Sad  Thought Process:  Goal Directed and Descriptions of Associations: Intact  Orientation:  Full (Time, Place, and Person)  Thought Content:  Logical and Delusions, persecutory delusions of others trying to harm her.  Suicidal Thoughts:  No  Homicidal Thoughts:  No  Memory:  Immediate;   Good Recent;   Good  Judgement:  Fair  Insight:  Fair  Psychomotor Activity:  Normal  Concentration:  Concentration: Good and Attention Span: Good  Recall:  Good  Fund of Knowledge: Good  Language: Good  Akathisia:  Negative  Handed:  Right  AIMS (if indicated): not done  Assets:  Communication Skills Desire for Improvement Financial Resources/Insurance Housing Talents/Skills Transportation Vocational/Educational  ADL's:  Intact  Cognition: WNL  Sleep:  Good  with help of Trazodone   Screenings: AIMS     Admission (Discharged) from 07/08/2017 in [facility]INE  AIMS Total Score 0    AUDIT     Admission (Discharged) from 07/08/2017 in [facility]PATIENT BE[facility]AL MEDICINE  Alcohol Use Disorder Identification Test Final Score (AUDIT) 17    CAGE-AID     Counselor from 03/24/2020 in [facility]  CAGE-AID Score 3    PHQ2-9     Office Visit from 06/10/2019 in [facility]  PHQ-2 Total Score 0       Assessment and Plan: Patient was agreeable to increasing the dose of Vraylar to 3 mg daily for optimal control of paranoid symptoms. She was also agreeable to trial of Prozac to help with the depressive and anxiety symptoms.  1. Schizoaffective disorder, bipolar type (HCC)  - traZODone (DESYREL) 50 MG tablet; Take 1 tablet (50 mg total) by mouth at bedtime as needed for sleep.  Dispense: 30 tablet; Refill: 1 - Start FLUoxetine (PROZAC) 10 MG capsule; Take 1 capsule (10 mg total) by mouth daily.  Dispense: 30 capsule; Refill: 1 - Increase cariprazine (VRAYLAR) capsule; Take 1 capsule (3 mg total) by mouth daily.  Dispense: 30 capsule; Refill: 0. Samples provided.  2. Anxiety  - busPIRone (BUSPAR) 10 MG tablet; Take 1 tablet (10 mg total) by mouth 2 (two) times daily.   Dispense: 60 tablet; Refill: 1     F/up in 3 weeks. Continue individual therapy.    Zena Amos, MD 04/20/2020, 2:14 PM

## 2020-04-20 NOTE — Progress Notes (Signed)
   THERAPIST PROGRESS NOTE  Session Time: 3  Participation Level: Active  Behavioral Response: Casual and Fairly GroomedAlertDepressed, Hopeless and Irritable  Type of Therapy: Individual Therapy  Treatment Goals addressed: Anxiety  Interventions: CBT  Summary: Victoria [name] is a 29 y.o. female who presents with Schizoaffective disorder bipolar type.   Suicidal/Homicidal: Nowithout intent/plan   Subjectiveobjective: Pt was alert and oriented x 5. Anxious flat mood/affect. Glorine engaged throughout session to the best of her abilities.   Pt was very irritable and anxious throughout session. She reports that she has been paranoid lately which has caused pt stress with job, Education officer, community, and social strain. Due to paranoia she reports she has not wanted to leave the house causing her to miss time at work. This has created a domino effect on pt finances in which pt in now 3 months behind on her car payment. Victoria [name] states "I know my man will help me out, but he pays for everything and nobody wants to have that type of needy all the time" Pt asked has been looking up her different diagnosis and seems to be fixated on the fact that she was missed diagnosed. LCSW reported to pt that psychiatrist and LCSW would sit down to discuss pt concerns.    Assessment: Pt endorses symptoms of paranoia stating that she does not even want to leave the house due tp people trying to get to her. She states she has feelings of depression, anxiousness, panic attacks. Victoria [name] states "My heart feels like it almost skips a beat, I am checking my pulse all the time". Pt still meets criteria for schizoaffective disorder bipolar type   lan: LCSW will f/u with psychiatrist to discuss further treatment, pt will write down a list of reason why work and money is needed, pt was agreeable to keep it in her night stand to help motivate pt to go to work.     Plan: Return again in 2 weeks.  Diagnosis: Axis I:  Schizoaffective Disorder      Victoria Cooks, LCSW 04/20/2020

## 2020-04-21 ENCOUNTER — Ambulatory Visit (LOCAL_COMMUNITY_HEALTH_CENTER): Payer: Self-pay | Admitting: Physician Assistant

## 2020-04-21 ENCOUNTER — Encounter: Payer: Self-pay | Admitting: Physician Assistant

## 2020-04-21 VITALS — BP 115/78 | Ht 63.0 in | Wt 144.0 lb

## 2020-04-21 DIAGNOSIS — Z3009 Encounter for other general counseling and advice on contraception: Secondary | ICD-10-CM

## 2020-04-21 DIAGNOSIS — Z0000 Encounter for general adult medical examination without abnormal findings: Secondary | ICD-10-CM

## 2020-04-21 DIAGNOSIS — Z3041 Encounter for surveillance of contraceptive pills: Secondary | ICD-10-CM

## 2020-04-21 MED ORDER — MULTI-VITAMIN/MINERALS PO TABS: 1.0000 | ORAL_TABLET | Freq: Every day | ORAL | 0 refills | Status: DC

## 2020-04-21 MED ORDER — NORGESTIMATE-ETH ESTRADIOL 0.25-35 MG-MCG PO TABS: 1.0000 | ORAL_TABLET | Freq: Every day | ORAL | 13 refills | Status: DC

## 2020-04-21 NOTE — Progress Notes (Signed)
Family Planning Visit- Repeat Yearly Visit  Subjective:  Victoria [name] is a 29 y.o. No obstetric history on file.  being seen today for an well woman visit and to discuss family planning options.    She is currently using OCP (estrogen/progesterone) for pregnancy prevention. Patient reports she does not want a pregnancy in the next year. Patient  has Bipolar 2 disorder, major depressive episode (HCC); Social anxiety disorder; Alcohol use disorder, moderate, dependence (HCC); PTSD (post-traumatic stress disorder); Tobacco use disorder; Bipolar 2 disorder (HCC); Carpal tunnel syndrome of right wrist; Schizoaffective disorder (HCC); and Anxiety on their problem list.  Chief Complaint  Patient presents with  . Contraception    Physical and birth control pills    Patient reports that she was doing well with OCs to control her bleeding but ran out about 1 month ago. States that a lot of times her BP is elevated at MD office.  Dx with migraines 3 years ago but not on meds at this time due to cost so has had more headaches than usual.  States that OCs do not worsen headaches.  Per chart pap due 2022.  CBE will be done today. PHQ-9=16 today and denies suicidal or homicidal ideation or plan.  States that she had an appointment with her therapist on 04/20/2020, and is taking her medicines as directed.  Patient denies any concerns today.  Declines referral to LCSW today since has a provider and is on medicine for Anxiety and Depression.   See flowsheet for other program required questions.   Body mass index is 25.51 kg/m. - Patient is eligible for diabetes screening based on BMI and age >41?  not applicable HA1C ordered? not applicable  Patient reports 1 of partners in last year. Desires STI screening?  No - declines.   Has patient been screened once for HCV in the past?  No  No results found for: HCVAB  Does the patient have current of drug use, have a partner with drug use, and/or has been  incarcerated since last result? No  If yes-- Screen for HCV through [facility]   Does the patient meet criteria for HBV testing? No  Criteria:  -Household, sexual or needle sharing contact with HBV -History of drug use -HIV positive -Those with known Hep C   Health Maintenance Due  Topic Date Due  . Hepatitis C Screening  Never done  . COVID-19 Vaccine (1) Never done  . TETANUS/TDAP  Never done    Review of Systems  All other systems reviewed and are negative.   The following portions of the patient's history were reviewed and updated as appropriate: allergies, current medications, past family history, past medical history, past social history, past surgical history and problem list. Problem list updated.  Objective:   Vitals:   04/21/20 0906  BP: 115/78  Weight: 144 lb (65.3 kg)  Height: 5\' 3"  (1.6 m)    Physical Exam Vitals and nursing note reviewed.  Constitutional:      General: She is not in acute distress.    Appearance: Normal appearance. She is normal weight.  HENT:     Head: Normocephalic and atraumatic.  Eyes:     Conjunctiva/sclera: Conjunctivae normal.  Neck:     Thyroid: No thyroid mass, thyromegaly or thyroid tenderness.  Cardiovascular:     Rate and Rhythm: Normal rate and regular rhythm.  Pulmonary:     Effort: Pulmonary effort is normal.     Breath sounds: Normal breath  sounds.  Chest:     Breasts:        Right: Normal.        Left: Normal.  Abdominal:     Palpations: Abdomen is soft. There is no mass.     Tenderness: There is no abdominal tenderness. There is no guarding or rebound.  Musculoskeletal:     Cervical back: Neck supple. No tenderness.  Lymphadenopathy:     Cervical: No cervical adenopathy.     Upper Body:     Right upper body: No supraclavicular, axillary or pectoral adenopathy.     Left upper body: No supraclavicular, axillary or pectoral adenopathy.  Skin:    General: Skin is warm and dry.     Findings: No bruising,  erythema, lesion or rash.  Neurological:     Mental Status: She is alert and oriented to person, place, and time.  Psychiatric:        Mood and Affect: Mood normal.        Behavior: Behavior normal.        Thought Content: Thought content normal.        Judgment: Judgment normal.       Assessment and Plan:  Victoria [name] is a 28 y.o. female No obstetric history on file. presenting to the [facility] for an yearly well woman exam/family planning visit  Contraception counseling: Reviewed all forms of birth control options in the tiered based approach. available including abstinence; over the counter/barrier methods; hormonal contraceptive medication including pill, patch, ring, injection,contraceptive implant, ECP; hormonal and nonhormonal IUDs; permanent sterilization options including vasectomy and the various tubal sterilization modalities. Risks, benefits, and typical effectiveness rates were reviewed.  Questions were answered.  Written information was also given to the patient to review.  Patient desires to restart her OCs, this was prescribed for patient. She will follow up in  8 months for pill supply and PRN for surveillance.  She was told to call with any further questions, or with any concerns about this method of contraception.  Emphasized use of condoms 100% of the time for STI prevention.  Patient was not a candidate for ECP today.   1. Encounter for counseling regarding contraception Reviewed how to start OCs and when to d/c and call clinic. Rec condoms with all sex for 2 weeks of first cycle and if late OCs.  2. Well woman exam (no gynecological exam) Reviewed with patient healthy habits to maintain normal BMI. Enc MVI 1 po daily. Enc to establish with /follow up with PCP for headaches, primary care concerns and illness. Enc to continue follow up with therapist for mental health maintenance. - Multiple Vitamins-Minerals (MULTIVITAMIN WITH  MINERALS) tablet; Take 1 tablet by mouth daily.  Dispense: 100 tablet; Refill: 0  3. Surveillance of previously prescribed contraceptive pill OK to restart Sprintec 28d 1 po daily today.   RTC for pill supply prior to running out. - norgestimate-ethinyl estradiol (SPRINTEC 28) 0.25-35 MG-MCG tablet; Take 1 tablet by mouth daily.  Dispense: 28 tablet; Refill: 13     Return for pill supply prior to running out ot birth control pills, annual and PRN.  Future Appointments  Date Time Provider Department Center  05/10/2020  1:00 PM Laretta Alstrom [facility] None  05/10/2020  2:30 PM Zena Amos, MD [facility] None    Matt Holmes, [place]

## 2020-04-21 NOTE — Progress Notes (Signed)
Pt received MVI's per pt request. Pt received Sprintec #8 packs today due to supply available and expiration date. Pt aware to give [place] a call when she opens up her last pack of pills so she can RTC prior to running out of pills for the remaining packs we owe her and pt states understanding. Pt received Kathreen Cosier, LCSW and [employer] cards with contact info per pt request in case she changes her mind and desires these services later on. Counseled pt per provider orders and pt states understanding. Provider orders completed.

## 2020-04-21 NOTE — Progress Notes (Signed)
Pt here for physical and to restart birth control pills. Pt reports the last time she took a birth control pill was early 03/2020 and that her last sex was the end of 02/2020 without condom per pt. Pt reports she has had menstrual bleeding constantly since running out of birth control pills. Pt filling out PHQ9.

## 2020-05-09 ENCOUNTER — Encounter (HOSPITAL_COMMUNITY): Payer: Self-pay | Admitting: Psychiatry

## 2020-05-10 ENCOUNTER — Telehealth (INDEPENDENT_AMBULATORY_CARE_PROVIDER_SITE_OTHER): Payer: No Payment, Other | Admitting: Psychiatry

## 2020-05-10 ENCOUNTER — Encounter (HOSPITAL_COMMUNITY): Payer: Self-pay | Admitting: Psychiatry

## 2020-05-10 ENCOUNTER — Ambulatory Visit (INDEPENDENT_AMBULATORY_CARE_PROVIDER_SITE_OTHER): Payer: No Payment, Other | Admitting: Licensed Clinical Social Worker

## 2020-05-10 ENCOUNTER — Other Ambulatory Visit: Payer: Self-pay

## 2020-05-10 DIAGNOSIS — F419 Anxiety disorder, unspecified: Secondary | ICD-10-CM | POA: Diagnosis not present

## 2020-05-10 DIAGNOSIS — F250 Schizoaffective disorder, bipolar type: Secondary | ICD-10-CM

## 2020-05-10 MED ORDER — LORAZEPAM 0.5 MG PO TABS: 0.5000 mg | ORAL_TABLET | Freq: Two times a day (BID) | ORAL | 1 refills | Status: DC

## 2020-05-10 MED ORDER — LURASIDONE HCL 40 MG PO TABS: 40.0000 mg | ORAL_TABLET | Freq: Every day | ORAL | 0 refills | Status: DC

## 2020-05-10 MED ORDER — FLUOXETINE HCL 10 MG PO CAPS: 10.0000 mg | ORAL_CAPSULE | Freq: Every day | ORAL | 1 refills | Status: DC

## 2020-05-10 NOTE — Progress Notes (Signed)
   THERAPIST PROGRESS NOTE  Session Time: 69  Therapist Response:    Subjective: LCSW and pt met. Pt states that she has increased stress levels as she recently quit her job 1 week ago. Deryn states that "I have not left my house in the past week" she continued to say, "I just want to hold down a job". She recently has stopped taken her Arman Filter because it was making decrease in energy and anxious. LCSW reported to pt that pt should always take medication as prescribed and inform medication team know of any side effects. She agreed that she should not have stopped her medication without informing medical team first. She states "I just do not want a medication that make me feel tired and anxious. I can handle weight gain or weight loss, but I do not want to feel any different as a person".    Objective: Armonii was alert and oriented x 5. She was dressed casually with irritable and depressed affect. She was engaged throughout assessment.    Assessment: Pt endorses symptoms of psychotic symptoms of paranoia. She currently is experiencing depression and anxiety as evidence by frequent panic attack with Palpitations. She continues to meet criteria for schizoaffective disorder.    Plan:  Pt will follow up with medication mgmt. at 2:30 7/20, she is agreeable to take all medication as prescribed, she will write down symptoms she is experiencing and then report them to medication staff.    Participation Level: Active  Behavioral Response: CasualAlertAnxious and Irritable  Type of Therapy: Individual Therapy  Treatment Goals addressed: Anxiety  Interventions: CBT and Supportive  Summary: Mekiah Wahler is a 29 y.o. female who presents with:   Suicidal/Homicidal: Nowithout intent/plan   Plan: Return again in 3 weeks.  Diagnosis: Axis I: Schizoaffective Disorder    Dory Horn, LCSW 05/10/2020

## 2020-05-10 NOTE — Progress Notes (Signed)
BH MD/PA/NP OP Progress Note  Virtual Visit via Video Note  I connected with Victoria [name] on 05/10/20 at  2:30 PM EDT by a video enabled telemedicine application and verified that I am speaking with the correct person using two identifiers.  Location: Patient: Home Provider: Clinic   I discussed the limitations of evaluation and management by telemedicine and the availability of in person appointments. The patient expressed understanding and agreed to proceed.  I provided 19 minutes of non-face-to-face time during this encounter.     05/10/2020 4:52 PM Victoria [name]  MRN:  [id number]  Chief Complaint:  " I am sorry but I stopped taking the Vraylar."  HPI: Pt stated that she did not find increase in the dose of Vraylar to be any helpful. She still continued to feel paranoid. 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 She stated that Vraylar did not seem to be doing anything for her symptoms. She stated that she knows she should not stop the medication just like that however she did.  Patient stated that she started taking Prozac at bedtime few days ago as it was making her quite sleepy during the daytime. It has helped her mood to some extent. She informed that she is still taking BuSpar for anxiety however lately she started having panic attacks. She has tried hydroxyzine in the past but did not find it to be helpful at all. Patient stated that she is in a lot of anxiety lately and needs help with that urgently. Patient was offered Ativan 0.5 mg twice daily to help with the anxiety. She was informed that this is only for short-term course of therapy and she was also informed that it is a controlled substance and if her employer does urine drug screen it will show on her toxicology report. Potential side effects of medication and risks vs benefits of treatment vs non-treatment were explained and discussed. All questions  were answered. Patient verbalized understanding of all this and stated that she is willing to try Ativan for short-term course.  She was also offered Latuda to help with psychotic symptoms and mood stabilization. Potential side effects of medication and risks vs benefits of treatment vs non-treatment were explained and discussed. All questions were answered. Patient was agreeable to try Latuda.  She informed that she is starting a new job next week.    Past meds tried: Risperidone- excessive weight gain. Seroquel in the past-made her very tired during the daytime. Took Abilify in the recent past and the dose was titrated up to 15 mg however she did not notice any improvement in her psychotic symptoms. Vraylar- helped initiall but did not find it helpful after a few weeks. Lamictal- ineffective. Hydroxyzine- ineffective, Buspar- ineffective. Zoloft- did not feel it was helpful. Celexa- did not find it helpful.   Visit Diagnosis:    ICD-10-CM   1. Schizoaffective disorder, bipolar type (HCC)  F25.0 FLUoxetine (PROZAC) 10 MG capsule    lurasidone (LATUDA) 40 MG TABS tablet  2. Anxiety  F41.9 LORazepam (ATIVAN) 0.5 MG tablet    Past Psychiatric History: Mood disorder  Past Medical History:  Past Medical History:  Diagnosis Date   Anxiety    Depression    Genital herpes    HPV (human papilloma virus) anogenital infection    Hypertension 06/22/2017   Migraines     Past Surgical History:  Procedure Laterality Date   BACK SURGERY  2011   scoliosis repair   DG SCOLIOSIS SERIES ([facility] HX)      Family Psychiatric History: denied  Family History:  Family History  Problem Relation Age of Onset   Healthy Mother    Healthy Father    Hypertension Father    Hypertension Paternal Grandfather    Schizophrenia Maternal Grandmother    Diabetes Maternal Aunt    Diabetes Maternal Uncle     Social History:  Social History   Socioeconomic History   Marital status:  Single    Spouse name: Not on file   Number of children: Not on file   Years of education: Not on file   Highest education level: Not on file  Occupational History   Not on file  Tobacco Use   Smoking status: Current Some Day Smoker    Packs/day: 0.50    Years: 9.00    Pack years: 4.50    Types: E-cigarettes   Smokeless tobacco: Never Used  Substance and Sexual Activity   Alcohol use: Not Currently    Comment: Socially.   Drug use: No   Sexual activity: Yes  Other Topics Concern   Not on file  Social History Narrative   Not on file   Social Determinants of Health   Financial Resource Strain:    Difficulty of Paying Living Expenses:   Food Insecurity:    Worried About Programme researcher, broadcasting/film/video in the Last Year:    Barista in the Last Year:   Transportation Needs:    Freight forwarder (Medical):    Lack of Transportation (Non-Medical):   Physical Activity:    Days of Exercise per Week:    Minutes of Exercise per Session:   Stress:    Feeling of Stress :   Social Connections:    Frequency of Communication with Friends and Family:    Frequency of Social Gatherings with Friends and Family:    Attends Religious Services:    Active Member of Clubs or Organizations:    Attends Banker Meetings:    Marital Status:     Allergies: No Known Allergies  Metabolic Disorder Labs: Lab Results  Component Value Date   HGBA1C 5.3 07/09/2017   MPG 105.41 07/09/2017   No results found for: PROLACTIN Lab Results  Component Value Date   CHOL 285 (H) 07/09/2017   TRIG 124 07/09/2017   HDL 56 07/09/2017   CHOLHDL 5.1 07/09/2017   VLDL 25 07/09/2017   LDLCALC 204 (H) 07/09/2017   Lab Results  Component Value Date   TSH 1.421 07/09/2017    Therapeutic Level Labs: No results found for: LITHIUM No results found for: VALPROATE No components found for:  CBMZ  Current Medications: Current Outpatient Medications  Medication Sig  Dispense Refill   Biotin w/ Vitamins C & E (HAIR SKIN & NAILS GUMMIES PO) Take 2 tablets by mouth. 2 gummies (Patient not taking: Reported on 04/21/2020)     cariprazine (VRAYLAR) capsule Take 1 capsule (1.5 mg total) by mouth daily. (Patient not taking: Reported on 04/21/2020) 30 capsule 0   FLUoxetine (PROZAC) 10 MG capsule Take 1 capsule (10 mg total) by mouth daily. 30 capsule 1   LORazepam (ATIVAN) 0.5 MG tablet Take 1 tablet (0.5 mg total) by mouth 2 (two) times daily. 60 tablet 1   lurasidone (LATUDA) 40 MG TABS tablet Take 1 tablet (40 mg total) by mouth daily with supper. 30 tablet 0   Multiple Vitamins-Minerals (MULTIVITAMIN  WITH MINERALS) tablet Take 1 tablet by mouth daily. 100 tablet 0   naproxen (NAPROSYN) 500 MG tablet Take 1 tablet (500 mg total) by mouth 2 (two) times daily. (Patient not taking: Reported on 07/21/2019) 30 tablet 0   norgestimate-ethinyl estradiol (ORTHO-CYCLEN) 0.25-35 MG-MCG tablet Take 1 tablet by mouth daily. (Patient not taking: Reported on 04/21/2020) 4 Package 0   norgestimate-ethinyl estradiol (SPRINTEC 28) 0.25-35 MG-MCG tablet Take 1 tablet by mouth daily. 28 tablet 13   traZODone (DESYREL) 50 MG tablet Take 1 tablet (50 mg total) by mouth at bedtime as needed for sleep. (Patient not taking: Reported on 04/21/2020) 30 tablet 1   Current Facility-Administered Medications  Medication Dose Route Frequency Provider Last Rate Last Admin   medroxyPROGESTERone (DEPO-PROVERA) injection 150 mg  150 mg Intramuscular Once Larene Pickett, FNP          Psychiatric Specialty Exam: Review of Systems  Gastrointestinal: Negative.     There were no vitals taken for this visit.There is no height or weight on file to calculate BMI.  General Appearance: Well Groomed  Eye Contact:  Good  Speech:  Clear and Coherent and Normal Rate  Volume:  Normal  Mood:  Anxious  Affect:  Anxious  Thought Process:  Goal Directed and Descriptions of Associations: Intact   Orientation:  Full (Time, Place, and Person)  Thought Content: Logical and Delusions, persecutory delusions of others trying to harm her.  Suicidal Thoughts:  No  Homicidal Thoughts:  No  Memory:  Immediate;   Good Recent;   Good  Judgement:  Fair  Insight:  Fair  Psychomotor Activity:  Normal  Concentration:  Concentration: Good and Attention Span: Good  Recall:  Good  Fund of Knowledge: Good  Language: Good  Akathisia:  Negative  Handed:  Right  AIMS (if indicated): not done  Assets:  Communication Skills Desire for Improvement Financial Resources/Insurance Housing Talents/Skills Transportation Vocational/Educational  ADL's:  Intact  Cognition: WNL  Sleep:  Good with help of Trazodone   Screenings: AIMS     Admission (Discharged) from 07/08/2017 in [facility]PAT[facility] BE[facility]AL MEDICINE  AIMS Total Score 0    AUDIT     Admission (Discharged) from 07/08/2017 in [facility]PATIENT BEHAV[facility]AL MEDICINE  Alcohol Use Disorder Identification Test Final Score (AUDIT) 17    CAGE-AID     Counselor from 03/24/2020 in [facility]  CAGE-AID Score 3    PHQ2-9     Office Visit from 04/21/2020 in [facility] Department Office Visit from 06/10/2019 in [facility]  PHQ-2 Total Score 4 0       Assessment and Plan: Patient is reporting ongoing psychotic symptoms and also ongoing anxiety and panic symptoms. She did not find Vraylar and buspirone to be helpful with the symptoms respectively. She is agreeable to trying Latuda to help with paranoid delusions and is willing to try Ativan to help with anxiety.  1. Schizoaffective disorder, bipolar type (HCC)  - Continue FLUoxetine (PROZAC) 10 MG capsule; Take 1 capsule (10 mg total) by mouth daily.  Dispense: 30 capsule; Refill: 1 - Start lurasidone (LATUDA) 40 MG TABS tablet; Take 1 tablet (40 mg total) by mouth daily with supper.  Dispense: 30 tablet; Refill: 0. Pt will come in the  morning to pick up the sample.  2. Anxiety  -Start LORazepam (ATIVAN) 0.5 MG tablet; Take 1 tablet (0.5 mg total) by mouth 2 (two) times daily.  Dispense: 60 tablet; Refill: 1  F/up in 4 weeks. Continue individual therapy.    Zena Amos, MD 05/10/2020, 4:52 PM

## 2020-06-07 ENCOUNTER — Other Ambulatory Visit: Payer: Self-pay

## 2020-06-07 ENCOUNTER — Ambulatory Visit (INDEPENDENT_AMBULATORY_CARE_PROVIDER_SITE_OTHER): Payer: No Payment, Other | Admitting: Licensed Clinical Social Worker

## 2020-06-07 DIAGNOSIS — F250 Schizoaffective disorder, bipolar type: Secondary | ICD-10-CM | POA: Diagnosis not present

## 2020-06-07 NOTE — Progress Notes (Signed)
   THERAPIST PROGRESS NOTE  Session Time: 64    Virtual Visit via Telephone Note  I connected with [name] on 06/07/20 at  2:00 PM EDT by telephone and verified that I am speaking with the correct person using two identifiers.  Location[place]: [name]  Provider: [name] [facility]    I discussed the limitations, risks, security and privacy concerns of performing an evaluation and management service by telephone and the availability of in person appointments. I also discussed with the patient that there may be a patient responsible charge related to this service. The patient expressed understanding and agreed to proceed.   Therapist Response:   Subjective/Objective:  Pt is alert and oriented x 5. Visual observation was not assessed as assessment was completed via telephone. She had anxious mood.  Primary stressor for pt has been her relationship. 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 Her current job is only 8 minutes away from her house. When pt is off she is either cooking or cleaning the house due to her significant other work hours. Pt does report though that when she does have down time this is when the paranoia thoughts start and usually, they are regarding her significant other Although there is no validity to the root of the thought for example "Does he Love me?" "Why is he not paying more attention to me?" "Is he cheating on me?". These thoughts even occur when he is doing chores around the house on his days off. [place]ia reports that this has caused tension in the relationship although her significant other has been able to deescalate the situation.   Currently pt has been taking all medications as directed. Amil states that her Kasandra Knudsen is running low as these were samples, she has about 7 days left of this with refills on other medications. It has caused pt to gain weight which she is  not pleased about however it has decreased her sleepiness. Pt does states that her Milinda Cave is working for panic attacks only but does nothing for her paranoia which she still reports is present currently.    Assessment/Plan: Pt endorses symptoms for paranoia, irritability, tension, worry, rapid thoughts and panic attack.  Currently she still meets criteria for schizoaffective disorder bipolar type. Denies any hallucinations or delusions and SI/HI thoughts. Plan moving forward try guided meditation when paranoid thoughts occur at least 1 x weekly and take all medication as prescribed.     I discussed the assessment and treatment plan with the patient. The patient was provided an opportunity to ask questions and all were answered. The patient agreed with the plan and demonstrated an understanding of the instructions.   The patient was advised to call back or seek an in-person evaluation if the symptoms worsen or if the condition fails to improve as anticipated.  I provided 45 minutes of non-face-to-face time during this encounter.   Weber Cooks, LCSW   Participation Level: Active  Behavioral Response: NA and CasualAlertAnxious and Irritable  Type of Therapy: Individual Therapy  Treatment Goals addressed: Anxiety  Interventions: CBT and Supportive  Summary: Latica Hohmann is a 29 y.o. female who presents with Schizoaffective Disorder bipolar type .   Suicidal/Homicidal: Nowithout intent/plan  Plan: Return again in  06/21/2020.  Diagnosis: Axis I: Schizoaffective disorder bipolar type    Weber Cooks, LCSW 06/07/2020

## 2020-06-20 ENCOUNTER — Telehealth (HOSPITAL_COMMUNITY): Payer: Self-pay | Admitting: Psychiatry

## 2020-06-20 DIAGNOSIS — F419 Anxiety disorder, unspecified: Secondary | ICD-10-CM

## 2020-06-20 NOTE — Telephone Encounter (Signed)
28 days of samples at front desk for her to pick up along with an appt with Dr Evelene Croon in 28 days. Notified pt of arrangement.

## 2020-06-20 NOTE — Telephone Encounter (Signed)
There should be a refill for Lorazepam waiting for her in the pharmacy. Encourage her to keep her next appt with me.

## 2020-06-21 ENCOUNTER — Other Ambulatory Visit: Payer: Self-pay

## 2020-06-21 ENCOUNTER — Ambulatory Visit (HOSPITAL_COMMUNITY): Payer: No Payment, Other | Admitting: Licensed Clinical Social Worker

## 2020-07-05 ENCOUNTER — Other Ambulatory Visit: Payer: Self-pay

## 2020-07-05 ENCOUNTER — Telehealth (HOSPITAL_COMMUNITY): Payer: Self-pay | Admitting: Licensed Clinical Social Worker

## 2020-07-05 ENCOUNTER — Ambulatory Visit (HOSPITAL_COMMUNITY): Payer: No Payment, Other | Admitting: Licensed Clinical Social Worker

## 2020-07-05 NOTE — Telephone Encounter (Signed)
LCSW sent two links to phone number provided in epic. Then f/u with a HIPAA compliant VM. This is pt 2nd no show in a row.

## 2020-07-13 ENCOUNTER — Telehealth (HOSPITAL_COMMUNITY): Payer: No Payment, Other | Admitting: Psychiatry

## 2020-07-13 ENCOUNTER — Other Ambulatory Visit: Payer: Self-pay

## 2020-07-13 ENCOUNTER — Telehealth (HOSPITAL_COMMUNITY): Payer: Self-pay | Admitting: Psychiatry

## 2020-07-13 NOTE — Telephone Encounter (Signed)
Patient did not respond to the links sent for her appointment scheduled for today.  The phone call went to voicemail.

## 2020-07-19 ENCOUNTER — Ambulatory Visit (HOSPITAL_COMMUNITY): Payer: No Payment, Other | Admitting: Licensed Clinical Social Worker

## 2020-08-01 ENCOUNTER — Ambulatory Visit (HOSPITAL_COMMUNITY): Payer: No Payment, Other | Admitting: Psychiatry

## 2020-08-02 ENCOUNTER — Ambulatory Visit (HOSPITAL_COMMUNITY): Payer: Self-pay | Admitting: Licensed Clinical Social Worker

## 2020-08-05 ENCOUNTER — Ambulatory Visit (HOSPITAL_COMMUNITY): Payer: No Payment, Other | Admitting: Licensed Clinical Social Worker

## 2020-08-05 ENCOUNTER — Other Ambulatory Visit: Payer: Self-pay

## 2020-08-05 ENCOUNTER — Telehealth (HOSPITAL_COMMUNITY): Payer: Self-pay | Admitting: Licensed Clinical Social Worker

## 2020-08-05 NOTE — Telephone Encounter (Signed)
LCSW sent two links to pt phone number listed in epic with no response. This is pt 4th no show between medication provider and LCSW. A PC was made to pt and HIPAA compliant VM left.

## 2020-08-15 ENCOUNTER — Ambulatory Visit (HOSPITAL_COMMUNITY): Payer: No Payment, Other | Admitting: Licensed Clinical Social Worker

## 2020-08-16 ENCOUNTER — Ambulatory Visit (HOSPITAL_COMMUNITY): Payer: Self-pay | Admitting: Licensed Clinical Social Worker

## 2020-08-30 ENCOUNTER — Ambulatory Visit (HOSPITAL_COMMUNITY): Payer: No Payment, Other | Admitting: Licensed Clinical Social Worker

## 2020-09-06 ENCOUNTER — Telehealth (HOSPITAL_COMMUNITY): Payer: Self-pay | Admitting: *Deleted

## 2020-09-06 NOTE — Telephone Encounter (Signed)
VM from patient stating her anxiety is getting worse and her BP has been going up and down, she is not sure if its related to her anxiety. She has an appt with Dr Evelene Croon in early Dec, missed the last appt. And should be out of her meds. She would like medicine called in for her, will let Dr know.

## 2020-09-06 NOTE — Telephone Encounter (Signed)
I will inform patient she needs to be seen first and offer her a walk in appt if she would like and feels she cant wait till her Dec appt.

## 2020-09-06 NOTE — Telephone Encounter (Signed)
Pt has not been seen by the writer since May 10, 2020. She has no showed for last appts with both Clinical research associate as well as the therapist. Will need to be seen before any meds are prescribed.

## 2020-09-22 ENCOUNTER — Other Ambulatory Visit: Payer: Self-pay

## 2020-09-22 ENCOUNTER — Encounter (HOSPITAL_COMMUNITY): Payer: Self-pay | Admitting: Psychiatry

## 2020-09-22 ENCOUNTER — Telehealth (INDEPENDENT_AMBULATORY_CARE_PROVIDER_SITE_OTHER): Payer: No Payment, Other | Admitting: Psychiatry

## 2020-09-22 DIAGNOSIS — F419 Anxiety disorder, unspecified: Secondary | ICD-10-CM | POA: Diagnosis not present

## 2020-09-22 DIAGNOSIS — F250 Schizoaffective disorder, bipolar type: Secondary | ICD-10-CM | POA: Diagnosis not present

## 2020-09-22 MED ORDER — FLUOXETINE HCL 20 MG PO CAPS: 20.0000 mg | ORAL_CAPSULE | Freq: Every day | ORAL | 1 refills | Status: DC

## 2020-09-22 MED ORDER — LURASIDONE HCL 40 MG PO TABS: 40.0000 mg | ORAL_TABLET | Freq: Every day | ORAL | 0 refills | Status: DC

## 2020-09-22 MED ORDER — LORAZEPAM 0.5 MG PO TABS: 0.5000 mg | ORAL_TABLET | Freq: Two times a day (BID) | ORAL | 1 refills | Status: DC | PRN

## 2020-09-22 NOTE — Progress Notes (Signed)
BH MD/PA/NP OP Progress Note  Virtual Visit via Telephone Note  I connected with Victoria [name] on 09/22/20 at  8:40 AM EST by telephone and verified that I am speaking with the correct person using two identifiers.  Location: Patient: home Provider: Clinic   I discussed the limitations, risks, security and privacy concerns of performing an evaluation and management service by telephone and the availability of in person appointments. I also discussed with the patient that there may be a patient responsible charge related to this service. The patient expressed understanding and agreed to proceed.   I provided 17 minutes of non-face-to-face time during this encounter.    09/22/2020 8:44 AM Victoria [name]  MRN:  [id number]  Chief Complaint:  " I have been feeling very depressed for a week."   HPI: Patient contacted via phone for follow-up.  Patient was last seen in July 2021.  Patient no-show for her following appointments after that with writer and also therapist.  Patient was taking Prozac 10 mg, Latuda 40 mg, Ativan 0.5 mg twice daily as needed at that time.  Today, patient reported that she has been feeling very depressed.  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  She did not have any assigned work for about 2 weeks after his demise.  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  She stated that she has no motivation to do anything.  She has been feeling very depressed.  She stated that she does not even feel like taking a shower.  Her sleep and appetite are poor.  She is also feeling very anxious and restless. She stated that she had old prescriptions of Prozac 10 mg, Lamictal 25 mg and buspirone 10 mg at her pharmacy from her previous provider and she went ahead and pick them  up and started taking them this past Sunday.   Patient asked how did she feel when choosing due to she replied that she did feel good when she took it regularly.  Writer asked if she would like to switch back to the [facility] and discontinue the Lamictal, patient was agreeable to this.  She was also agreeable to going up on the dose of Prozac for depression symptoms.  Patient repeatedly asked if she can be back on Ativan.  Writer reminded her that it is habit-forming, she replied that she is aware that it is a habit-forming medication however she feels she can use it for now.  Writer agreed to prescribe it to her for short-term course while we are adjusting her other medications for mood stabilization.   Past meds tried: Risperidone- excessive weight gain. Seroquel in the past-made her very tired during the daytime. Took Abilify in the recent past and the dose was titrated up to 15 mg however she did not notice any improvement in her psychotic symptoms. Vraylar- helped initially but did not find it helpful after a few weeks. Lamictal- ineffective. Hydroxyzine- ineffective, Buspar- ineffective. Zoloft- did not feel it was helpful. Celexa- did not find it helpful.   Visit Diagnosis:    ICD-10-CM   1. Schizoaffective disorder, bipolar type (HCC)  F25.0     Past Psychiatric History: Mood disorder  Past Medical History:  Past Medical History:  Diagnosis Date  .  Anxiety   . Depression   . Genital herpes   . HPV (human papilloma virus) anogenital infection   . Hypertension 06/22/2017  . Migraines     Past Surgical History:  Procedure Laterality Date  . BACK SURGERY  2011   scoliosis repair  . DG SCOLIOSIS SERIES ([facility] HX)      Family Psychiatric History: denied  Family History:  Family History  Problem Relation Age of Onset  . Healthy Mother   . Healthy Father   . Hypertension Father   . Hypertension Paternal Grandfather   . Schizophrenia Maternal Grandmother   . Diabetes Maternal  Aunt   . Diabetes Maternal Uncle     Social History:  Social History   Socioeconomic History  . Marital status: Single    Spouse name: Not on file  . Number of children: Not on file  . Years of education: Not on file  . Highest education level: Not on file  Occupational History  . Not on file  Tobacco Use  . Smoking status: Current Some Day Smoker    Packs/day: 0.50    Years: 9.00    Pack years: 4.50    Types: E-cigarettes  . Smokeless tobacco: Never Used  Substance and Sexual Activity  . Alcohol use: Not Currently    Comment: Socially.  . Drug use: No  . Sexual activity: Yes  Other Topics Concern  . Not on file  Social History Narrative  . Not on file   Social Determinants of Health   Financial Resource Strain:   . Difficulty of Paying Living Expenses: Not on file  Food Insecurity:   . Worried About Programme researcher, broadcasting/film/video in the Last Year: Not on file  . Ran Out of Food in the Last Year: Not on file  Transportation Needs:   . Lack of Transportation (Medical): Not on file  . Lack of Transportation (Non-Medical): Not on file  Physical Activity:   . Days of Exercise per Week: Not on file  . Minutes of Exercise per Session: Not on file  Stress:   . Feeling of Stress : Not on file  Social Connections:   . Frequency of Communication with Friends and Family: Not on file  . Frequency of Social Gatherings with Friends and Family: Not on file  . Attends Religious Services: Not on file  . Active Member of Clubs or Organizations: Not on file  . Attends Banker Meetings: Not on file  . Marital Status: Not on file    Allergies: No Known Allergies  Metabolic Disorder Labs: Lab Results  Component Value Date   HGBA1C 5.3 07/09/2017   MPG 105.41 07/09/2017   No results found for: PROLACTIN Lab Results  Component Value Date   CHOL 285 (H) 07/09/2017   TRIG 124 07/09/2017   HDL 56 07/09/2017   CHOLHDL 5.1 07/09/2017   VLDL 25 07/09/2017   LDLCALC 204 (H)  07/09/2017   Lab Results  Component Value Date   TSH 1.421 07/09/2017    Therapeutic Level Labs: No results found for: LITHIUM No results found for: VALPROATE No components found for:  CBMZ  Current Medications: Current Outpatient Medications  Medication Sig Dispense Refill  . Biotin w/ Vitamins C & E (HAIR SKIN & NAILS GUMMIES PO) Take 2 tablets by mouth. 2 gummies (Patient not taking: Reported on 04/21/2020)    . cariprazine (VRAYLAR) capsule Take 1 capsule (1.5 mg total) by mouth daily. (Patient not taking: Reported on 04/21/2020)  30 capsule 0  . FLUoxetine (PROZAC) 10 MG capsule Take 1 capsule (10 mg total) by mouth daily. 30 capsule 1  . LORazepam (ATIVAN) 0.5 MG tablet Take 1 tablet (0.5 mg total) by mouth 2 (two) times daily. 60 tablet 1  . lurasidone (LATUDA) 40 MG TABS tablet Take 1 tablet (40 mg total) by mouth daily with supper. 30 tablet 0  . Multiple Vitamins-Minerals (MULTIVITAMIN WITH MINERALS) tablet Take 1 tablet by mouth daily. 100 tablet 0  . naproxen (NAPROSYN) 500 MG tablet Take 1 tablet (500 mg total) by mouth 2 (two) times daily. (Patient not taking: Reported on 07/21/2019) 30 tablet 0  . norgestimate-ethinyl estradiol (ORTHO-CYCLEN) 0.25-35 MG-MCG tablet Take 1 tablet by mouth daily. (Patient not taking: Reported on 04/21/2020) 4 Package 0  . norgestimate-ethinyl estradiol (SPRINTEC 28) 0.25-35 MG-MCG tablet Take 1 tablet by mouth daily. 28 tablet 13  . traZODone (DESYREL) 50 MG tablet Take 1 tablet (50 mg total) by mouth at bedtime as needed for sleep. (Patient not taking: Reported on 04/21/2020) 30 tablet 1   Current Facility-Administered Medications  Medication Dose Route Frequency Provider Last Rate Last Admin  . medroxyPROGESTERone (DEPO-PROVERA) injection 150 mg  150 mg Intramuscular Once Larene PickettLatta, Cynthia, FNP          Psychiatric Specialty Exam: Review of Systems  Gastrointestinal: Negative.     There were no vitals taken for this visit.There is no height or  weight on file to calculate BMI.  General Appearance: unable to assess due to phone visit  Eye Contact:  unable to assess due to phone visit  Speech:  Clear and Coherent and Normal Rate  Volume:  Normal  Mood:  Anxious, depressed  Affect:  Depressed  Thought Process:  Goal Directed and Descriptions of Associations: Intact  Orientation:  Full (Time, Place, and Person)  Thought Content: Logical and Delusions, persecutory delusions of others trying to harm her, present  Suicidal Thoughts:  No  Homicidal Thoughts:  No  Memory:  Immediate;   Good Recent;   Good  Judgement:  Fair  Insight:  Fair  Psychomotor Activity:  Normal  Concentration:  Concentration: Good and Attention Span: Good  Recall:  Good  Fund of Knowledge: Good  Language: Good  Akathisia:  Negative  Handed:  Right  AIMS (if indicated): not done  Assets:  Communication Skills Desire for Improvement Financial Resources/Insurance Housing Talents/Skills Transportation Vocational/Educational  ADL's:  Intact  Cognition: WNL  Sleep:  Fair   Screenings: AIMS     [facility]  AIMS Total Score 0    AUDIT     Ad[facility] (Dis[facility]  Alcohol Use Disorder Identification Test Final Score (AUDIT) 17    CAGE-AID     [facility]sel[facility]  CAGE-AID Score 3    PHQ2-9     [facility]10/2019 [facility]  PHQ-2 Total Score 4 0       Assessment and Plan:   1. Schizoaffective disorder, bipolar type (HCC)  - Restart lurasidone (LATUDA) 40 MG TABS tablet; Take 1 tablet (40 mg total) by mouth daily with supper.  Dispense: 30 tablet; Refill: 0 - Increase FLUoxetine (PROZAC) 20 MG capsule; Take 1 capsule (20 mg total) by mouth daily.  Dispense: 30 capsule; Refill:  1  2. Anxiety  - LORazepam (ATIVAN)  0.5 MG tablet; Take 1 tablet (0.5 mg total) by mouth 2 (two) times daily as needed for anxiety.  Dispense: 45 tablet; Refill: 1 - FLUoxetine (PROZAC) 20 MG capsule; Take 1 capsule (20 mg total) by mouth daily.  Dispense: 30 capsule; Refill: 1   F/up in 4 weeks. Pt was advised to restart therapy sessions with Mr. Sherolyn Buba, MD 09/22/2020, 8:44 AM

## 2020-10-26 ENCOUNTER — Ambulatory Visit (INDEPENDENT_AMBULATORY_CARE_PROVIDER_SITE_OTHER): Payer: No Payment, Other | Admitting: Psychiatry

## 2020-10-26 ENCOUNTER — Encounter (HOSPITAL_COMMUNITY): Payer: Self-pay | Admitting: Psychiatry

## 2020-10-26 ENCOUNTER — Other Ambulatory Visit: Payer: Self-pay

## 2020-10-26 VITALS — BP 122/85 | HR 98 | Ht 63.0 in | Wt 145.0 lb

## 2020-10-26 DIAGNOSIS — F419 Anxiety disorder, unspecified: Secondary | ICD-10-CM | POA: Diagnosis not present

## 2020-10-26 DIAGNOSIS — F250 Schizoaffective disorder, bipolar type: Secondary | ICD-10-CM | POA: Diagnosis not present

## 2020-10-26 MED ORDER — LATUDA 60 MG PO TABS: 60.0000 mg | ORAL_TABLET | Freq: Every day | ORAL | 1 refills | Status: DC

## 2020-10-26 MED ORDER — FLUOXETINE HCL 20 MG PO CAPS: 20.0000 mg | ORAL_CAPSULE | Freq: Every day | ORAL | 1 refills | Status: DC

## 2020-10-26 MED ORDER — TRAZODONE HCL 50 MG PO TABS: 50.0000 mg | ORAL_TABLET | Freq: Every evening | ORAL | 1 refills | Status: DC | PRN

## 2020-10-26 MED ORDER — LORAZEPAM 0.5 MG PO TABS: 0.5000 mg | ORAL_TABLET | Freq: Two times a day (BID) | ORAL | 1 refills | Status: DC | PRN

## 2020-10-26 NOTE — Progress Notes (Signed)
BH MD/PA/NP OP Progress Note     10/26/2020 9:00 AM Victoria [name]  MRN:  [id number]  Chief Complaint: " I feel I'm doing better than before."  HPI: Patient reported that she has found increasing the dose of Prozac to be very helpful with her depression symptoms.  She feels her energy levels and mood have improved.  She is also pleased with the results she is getting from [facility].  She reported that she is not sleeping well and has not been taking her trazodone in the past few weeks but is agreeable to restart it for sleep. She does state that she wants to continue taking the Ativan as needed for anxiety because it helps her immensely. She stated that she is working part-time now and is happy with his progress personally.  She stated that she has missed a lot of her college classes and is also feeling her grades.  She requested a letter from the writer explaining her psychiatrist here for the school. She is restarting therapy with Madelaine Bhat and has an appointment scheduled with him in a few weeks from now. Patient stated that overall she feels she is on the right track but can do slightly better.  She reported that her paranoid delusions have improved significantly and she denied any recent auditory or visual hallucinations.   Writer asked if she would like to adjust her dose of Latuda to 60 mg for optimal effects, she replied that would be a good idea.  Past meds tried: Risperidone- excessive weight gain. Seroquel in the past-made her very tired during the daytime. Took Abilify in the recent past and the dose was titrated up to 15 mg however she did not notice any improvement in her psychotic symptoms. Vraylar- helped initially but did not find it helpful after a few weeks. Lamictal- ineffective. Hydroxyzine- ineffective, Buspar- ineffective. Zoloft- did not feel it was helpful. Celexa- did not find it helpful.   Visit Diagnosis:    ICD-10-CM   1. Schizoaffective disorder, bipolar type (HCC)   F25.0   2. Anxiety  F41.9     Past Psychiatric History: Mood disorder  Past Medical History:  Past Medical History:  Diagnosis Date  . Anxiety   . Depression   . Genital herpes   . HPV (human papilloma virus) anogenital infection   . Hypertension 06/22/2017  . Migraines     Past Surgical History:  Procedure Laterality Date  . BACK SURGERY  2011   scoliosis repair  . DG SCOLIOSIS SERIES ([facility] HX)      Family Psychiatric History: denied  Family History:  Family History  Problem Relation Age of Onset  . Healthy Mother   . Healthy Father   . Hypertension Father   . Hypertension Paternal Grandfather   . Schizophrenia Maternal Grandmother   . Diabetes Maternal Aunt   . Diabetes Maternal Uncle     Social History:  Social History   Socioeconomic History  . Marital status: Single    Spouse name: Not on file  . Number of children: Not on file  . Years of education: Not on file  . Highest education level: Not on file  Occupational History  . Not on file  Tobacco Use  . Smoking status: Current Some Day Smoker    Packs/day: 0.50    Years: 9.00    Pack years: 4.50    Types: E-cigarettes  . Smokeless tobacco: Never Used  Substance and Sexual Activity  . Alcohol use: Not Currently  Comment: Socially.  . Drug use: No  . Sexual activity: Yes  Other Topics Concern  . Not on file  Social History Narrative  . Not on file   Social Determinants of Health   Financial Resource Strain: Not on file  Food Insecurity: Not on file  Transportation Needs: Not on file  Physical Activity: Not on file  Stress: Not on file  Social Connections: Not on file    Allergies: No Known Allergies  Metabolic Disorder Labs: Lab Results  Component Value Date   HGBA1C 5.3 07/09/2017   MPG 105.41 07/09/2017   No results found for: PROLACTIN Lab Results  Component Value Date   CHOL 285 (H) 07/09/2017   TRIG 124 07/09/2017   HDL 56 07/09/2017   CHOLHDL 5.1 07/09/2017   VLDL 25  07/09/2017   LDLCALC 204 (H) 07/09/2017   Lab Results  Component Value Date   TSH 1.421 07/09/2017    Therapeutic Level Labs: No results found for: LITHIUM No results found for: VALPROATE No components found for:  CBMZ  Current Medications: Current Outpatient Medications  Medication Sig Dispense Refill  . cariprazine (VRAYLAR) capsule Take 1 capsule (1.5 mg total) by mouth daily. 30 capsule 0  . FLUoxetine (PROZAC) 20 MG capsule Take 1 capsule (20 mg total) by mouth daily. 30 capsule 1  . LORazepam (ATIVAN) 0.5 MG tablet Take 1 tablet (0.5 mg total) by mouth 2 (two) times daily as needed for anxiety. 45 tablet 1  . lurasidone (LATUDA) 40 MG TABS tablet Take 1 tablet (40 mg total) by mouth daily with supper. 30 tablet 0  . traZODone (DESYREL) 50 MG tablet Take 1 tablet (50 mg total) by mouth at bedtime as needed for sleep. (Patient not taking: No sig reported) 30 tablet 1   Current Facility-Administered Medications  Medication Dose Route Frequency Provider Last Rate Last Admin  . medroxyPROGESTERone (DEPO-PROVERA) injection 150 mg  150 mg Intramuscular Once Larene Pickett, FNP            Psychiatric Specialty Exam: Review of Systems  Gastrointestinal: Negative.     Blood pressure 122/85, pulse 98, height 5\' 3"  (1.6 m), weight 145 lb (65.8 kg), SpO2 99 %.Body mass index is 25.69 kg/m.  General Appearance: unable to assess due to phone visit  Eye Contact:  unable to assess due to phone visit  Speech:  Clear and Coherent and Normal Rate  Volume:  Normal  Mood:  Anxious, depressed  Affect:  Depressed  Thought Process:  Goal Directed and Descriptions of Associations: Intact  Orientation:  Full (Time, Place, and Person)  Thought Content: Logical and Delusions, persecutory delusions of others trying to harm her, present  Suicidal Thoughts:  No  Homicidal Thoughts:  No  Memory:  Immediate;   Good Recent;   Good  Judgement:  Fair  Insight:  Fair  Psychomotor Activity:  Normal   Concentration:  Concentration: Good and Attention Span: Good  Recall:  Good  Fund of Knowledge: Good  Language: Good  Akathisia:  Negative  Handed:  Right  AIMS (if indicated): not done  Assets:  Communication Skills Desire for Improvement Financial Resources/Insurance Housing Talents/Skills Transportation Vocational/Educational  ADL's:  Intact  Cognition: WNL  Sleep:  Fair   Screenings: AIMS   Flowsheet Row Admission (Discharged) from 07/08/2017 in [facility]AL MEDICINE  AIMS Total Score 0    AUDIT   Flowsheet Row Admission (Discharged) from 07/08/2017 in [facility]PAT[facility] BEHAVIORAL MEDICINE  Alcohol Use Disorder Identification Test  Final Score (AUDIT) 17    CAGE-AID   Flowsheet Row Counselor from 03/24/2020 in [facility]  CAGE-AID Score 3    PHQ2-9   [place] Office Visit from 04/21/2020 in [facility] Department Office Visit from 06/10/2019 in [place]  PHQ-2 Total Score 4 0       Assessment and Plan: Patient appears to be doing fairly better in terms of her mood and psychotic symptoms since her last visit about 5 weeks ago.  Will adjust the dose of Latuda to 60 mg for optimal effects.  Samples were provided.  We'll continue remaining regimen, patient was advised to restart trazodone for sleep as it was effective in the past.  1. Schizoaffective disorder, bipolar type (HCC)  - FLUoxetine (PROZAC) 20 MG capsule; Take 1 capsule (20 mg total) by mouth daily.  Dispense: 30 capsule; Refill: 1 - traZODone (DESYREL) 50 MG tablet; Take 1 tablet (50 mg total) by mouth at bedtime as needed for sleep.  Dispense: 30 tablet; Refill: 1 - Increase Lurasidone HCl (LATUDA) 60 MG TABS; Take 1 tablet (60 mg total) by mouth at bedtime.  Dispense: 30 tablet; Refill: 1 (samples provided.)  2. Anxiety  - FLUoxetine (PROZAC) 20 MG capsule; Take 1 capsule (20 mg total) by mouth daily.  Dispense: 30 capsule;  Refill: 1 - LORazepam (ATIVAN) 0.5 MG tablet; Take 1 tablet (0.5 mg total) by mouth 2 (two) times daily as needed for anxiety.  Dispense: 45 tablet; Refill: 1  F/up in 6 weeks. Pt is scheduled to restart therapy session with Mr. Jonelle Sports, MD 10/26/2020, 9:00 AM

## 2020-10-31 ENCOUNTER — Telehealth (HOSPITAL_COMMUNITY): Payer: Self-pay | Admitting: Psychiatry

## 2020-10-31 ENCOUNTER — Encounter (HOSPITAL_COMMUNITY): Payer: Self-pay | Admitting: Psychiatry

## 2020-10-31 NOTE — Telephone Encounter (Signed)
Received note from front desk staff: Victoria [name] is here. She stated that she is feeling better. She said the school accepted the letter you wrote for her; however, her professor stated that "it explains circumstances but doesn't show that it has remedied it. We will need a letter explaining that it has been resolved and should not prevent you from being successful moving forward" ...    Letter was issued as per pt request.

## 2020-11-02 ENCOUNTER — Other Ambulatory Visit (HOSPITAL_COMMUNITY): Payer: Self-pay | Admitting: Psychiatry

## 2020-11-02 DIAGNOSIS — F419 Anxiety disorder, unspecified: Secondary | ICD-10-CM

## 2020-11-18 ENCOUNTER — Ambulatory Visit (HOSPITAL_COMMUNITY): Payer: No Payment, Other | Admitting: Licensed Clinical Social Worker

## 2020-12-06 ENCOUNTER — Ambulatory Visit (INDEPENDENT_AMBULATORY_CARE_PROVIDER_SITE_OTHER): Payer: No Payment, Other | Admitting: Psychiatry

## 2020-12-06 ENCOUNTER — Other Ambulatory Visit: Payer: Self-pay

## 2020-12-06 ENCOUNTER — Encounter (HOSPITAL_COMMUNITY): Payer: Self-pay | Admitting: Psychiatry

## 2020-12-06 ENCOUNTER — Other Ambulatory Visit: Payer: Self-pay | Admitting: Psychiatry

## 2020-12-06 VITALS — BP 129/93 | HR 104 | Ht 63.0 in | Wt 155.0 lb

## 2020-12-06 DIAGNOSIS — F250 Schizoaffective disorder, bipolar type: Secondary | ICD-10-CM | POA: Diagnosis not present

## 2020-12-06 DIAGNOSIS — F419 Anxiety disorder, unspecified: Secondary | ICD-10-CM | POA: Diagnosis not present

## 2020-12-06 MED ORDER — LORAZEPAM 0.5 MG PO TABS: 0.5000 mg | ORAL_TABLET | Freq: Two times a day (BID) | ORAL | 1 refills | Status: DC | PRN

## 2020-12-06 MED ORDER — FLUOXETINE HCL 10 MG PO CAPS: ORAL_CAPSULE | ORAL | 1 refills | Status: DC

## 2020-12-06 MED ORDER — FLUOXETINE HCL 20 MG PO CAPS: 20.0000 mg | ORAL_CAPSULE | Freq: Every day | ORAL | 1 refills | Status: DC

## 2020-12-06 MED ORDER — TRAZODONE HCL 50 MG PO TABS: 50.0000 mg | ORAL_TABLET | Freq: Every evening | ORAL | 1 refills | Status: DC | PRN

## 2020-12-06 MED ORDER — LATUDA 60 MG PO TABS
60.0000 mg | ORAL_TABLET | Freq: Every day | ORAL | 1 refills | Status: DC
Start: 2020-12-06 — End: 2021-04-13

## 2020-12-06 MED FILL — LATUDA 60 MG TABLET: 60 | 30 days supply | Qty: 30 | Fill #0

## 2020-12-06 NOTE — Progress Notes (Signed)
BH MD/PA/NP OP Progress Note     12/06/2020 8:41 AM Victoria [name]  MRN:  [id number]  Chief Complaint: " I am doing great now."  HPI: Patient informed that she feels she is doing great now.  She stated that she has not felt this best in a while.  She stated that her mood has been stable.  She is not paranoid or having any hallucinations.  She is also sleeping well at night.  She feels her anxiety is much better controlled. 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 She showed pictures on her phone of how her boyfriend surprised her with a nice shower of presents for Valentine's Day.  She stated that he has told her that he can see she is doing much better compared to past few months. She did however ask if her dose of Prozac can be increased any further because she feels that she can do better in terms of her depression symptoms which sometimes come back momentarily. She also requested if her dose of Ativan can be increased. She informed that she has to take Ativan every morning regularly and then she takes it almost every evening to however she runs out because she only gets 45 tablets prescribed to her. She initially asked if the dose can be increased to 1 mg however when the writer told her that Ativan carries an addictive potential she agreed to just be prescribed 60 tablets so that she can have 1 tablet twice a day as it has been very helpful given her work and school schedule.  Writer informed her that she will be given samples for Latuda for a month today and that her [facility] for Kas[facility] Knudsen [facility] the pharmacy tech will help her with patient assistance program.   Past meds tried: Risperidone- excessive weight gain. Seroquel in the past-made her very tired during the daytime. Took Abilify in the recent past and the dose was titrated up to 15 mg however she did not  notice any improvement in her psychotic symptoms. Vraylar- helped initially but did not find it helpful after a few weeks. Lamictal- ineffective. Hydroxyzine- ineffective, Buspar- ineffective. Zoloft- did not feel it was helpful. Celexa- did not find it helpful.   Visit Diagnosis:  No diagnosis found.  Past Psychiatric History: Mood disorder  Past Medical History:  Past Medical History:  Diagnosis Date  . Anxiety   . Depression   . Genital herpes   . HPV (human papilloma virus) anogenital infection   . Hypertension 06/22/2017  . Migraines     Past Surgical History:  Procedure Laterality Date  . BACK SURGERY  2011   scoliosis repair  . DG SCOLIOSIS SERIES ([facility] HX)      Family Psychiatric History: denied  Family History:  Family History  Problem Relation Age of Onset  . Healthy Mother   . Healthy Father   . Hypertension Father   . Hypertension Paternal Grandfather   . Schizophrenia Maternal Grandmother   . Diabetes Maternal Aunt   . Diabetes Maternal Uncle     Social History:  Social History   Socioeconomic History  . Marital status: Single    Spouse name: Not on file  . Number of children: Not on file  . Years of education: Not on file  . Highest education level: Not on file  Occupational History  . Not  on file  Tobacco Use  . Smoking status: Current Some Day Smoker    Packs/day: 0.50    Years: 9.00    Pack years: 4.50    Types: E-cigarettes  . Smokeless tobacco: Never Used  Substance and Sexual Activity  . Alcohol use: Not Currently    Comment: Socially.  . Drug use: No  . Sexual activity: Yes  Other Topics Concern  . Not on file  Social History Narrative  . Not on file   Social Determinants of Health   Financial Resource Strain: Not on file  Food Insecurity: Not on file  Transportation Needs: Not on file  Physical Activity: Not on file  Stress: Not on file  Social Connections: Not on file    Allergies: No Known Allergies  Metabolic  Disorder Labs: Lab Results  Component Value Date   HGBA1C 5.3 07/09/2017   MPG 105.41 07/09/2017   No results found for: PROLACTIN Lab Results  Component Value Date   CHOL 285 (H) 07/09/2017   TRIG 124 07/09/2017   HDL 56 07/09/2017   CHOLHDL 5.1 07/09/2017   VLDL 25 07/09/2017   LDLCALC 204 (H) 07/09/2017   Lab Results  Component Value Date   TSH 1.421 07/09/2017    Therapeutic Level Labs: No results found for: LITHIUM No results found for: VALPROATE No components found for:  CBMZ  Current Medications: Current Outpatient Medications  Medication Sig Dispense Refill  . FLUoxetine (PROZAC) 20 MG capsule Take 1 capsule (20 mg total) by mouth daily. 30 capsule 1  . LORazepam (ATIVAN) 0.5 MG tablet Take 1 tablet (0.5 mg total) by mouth 2 (two) times daily as needed for anxiety. 45 tablet 1  . Lurasidone HCl (LATUDA) 60 MG TABS Take 1 tablet (60 mg total) by mouth at bedtime. 30 tablet 1  . traZODone (DESYREL) 50 MG tablet Take 1 tablet (50 mg total) by mouth at bedtime as needed for sleep. 30 tablet 1   Current Facility-Administered Medications  Medication Dose Route Frequency Provider Last Rate Last Admin  . medroxyPROGESTERone (DEPO-PROVERA) injection 150 mg  150 mg Intramuscular Once Larene Pickett, FNP            Psychiatric Specialty Exam: Review of Systems  Gastrointestinal: Negative.     There were no vitals taken for this visit.There is no height or weight on file to calculate BMI.  General Appearance: Well-groomed  Eye Contact: Good  Speech:  Clear and Coherent and Normal Rate  Volume:  Normal  Mood:  Euthymic  Affect:  Congruent  Thought Process:  Goal Directed and Descriptions of Associations: Intact  Orientation:  Full (Time, Place, and Person)  Thought Content: Logical, denies any delusions or hallucinations  Suicidal Thoughts:  No  Homicidal Thoughts:  No  Memory:  Immediate;   Good Recent;   Good  Judgement:  Fair  Insight:  Fair  Psychomotor  Activity:  Normal  Concentration:  Concentration: Good and Attention Span: Good  Recall:  Good  Fund of Knowledge: Good  Language: Good  Akathisia:  Negative  Handed:  Right  AIMS (if indicated): not done  Assets:  Communication Skills Desire for Improvement Financial Resources/Insurance Housing Talents/Skills Transportation Vocational/Educational  ADL's:  Intact  Cognition: WNL  Sleep:  Good   Screenings: AIMS   Flowsheet Row Admission (Discharged) from 07/08/2017 in [facility]INE  AIMS Total Score 0    AUDIT   Flowsheet Row Admission (Discharged) from 07/08/2017 in [facility] IN[facility]IENT [facility]AL MEDICINE  Alcohol Use Disorder Identification Test Final Score (AUDIT) 17    CAGE-AID   Flowsheet Row Counselor from 03/24/2020 in [facility]  CAGE-AID Score 3    PHQ2-9   Flowsheet Row Office Visit from 04/21/2020 in [facility] Department Office Visit from 06/10/2019 in [facility]  PHQ-2 Total Score 4 0       Assessment and Plan: Patient appears to be doing much better compared to her last few visits.  Patient feels she is on the right regimen however would like to try a higher dose of Prozac for optimal effects.  Denied any other concerns.  Also requested if she can be given 60 tablets of Ativan per month instead of 45 as she feels she needs to take it twice daily on almost all the days.   1. Schizoaffective disorder, bipolar type (HCC)  - FLUoxetine (PROZAC) 20 MG capsule; Take 1 capsule (20 mg total) by mouth daily.  Dispense: 30 capsule; Refill: 1 -Increase FLUoxetine (PROZAC) 10 MG capsule; Take 1 capsule daily with 20 mg capsule (total dose 30 mg)  Dispense: 30 capsule; Refill: 1 - traZODone (DESYREL) 50 MG tablet; Take 1 tablet (50 mg total) by mouth at bedtime as needed for sleep.  Dispense: 30 tablet; Refill: 1 - Lurasidone HCl (LATUDA) 60 MG TABS; Take 1 tablet (60 mg total) by mouth daily  with supper.  Dispense: 30 tablet; Refill: 1  2. Anxiety  - FLUoxetine (PROZAC) 20 MG capsule; Take 1 capsule (20 mg total) by mouth daily.  Dispense: 30 capsule; Refill: 1 - LORazepam (ATIVAN) 0.5 MG tablet; Take 1 tablet (0.5 mg total) by mouth 2 (two) times daily as needed for anxiety.  Dispense: 60 tablet; Refill: 1  F/up in 2 months. Pt is scheduled to restart therapy session with Mr. Sherolyn Buba, MD 12/06/2020, 8:41 AM

## 2021-01-18 ENCOUNTER — Ambulatory Visit (HOSPITAL_COMMUNITY): Payer: No Payment, Other | Admitting: Licensed Clinical Social Worker

## 2021-01-26 ENCOUNTER — Ambulatory Visit (HOSPITAL_COMMUNITY): Payer: No Payment, Other | Admitting: Psychiatry

## 2021-02-14 ENCOUNTER — Other Ambulatory Visit (HOSPITAL_COMMUNITY): Payer: Self-pay | Admitting: Psychiatry

## 2021-02-14 DIAGNOSIS — F250 Schizoaffective disorder, bipolar type: Secondary | ICD-10-CM

## 2021-03-04 ENCOUNTER — Other Ambulatory Visit (HOSPITAL_COMMUNITY): Payer: Self-pay | Admitting: Psychiatry

## 2021-03-04 DIAGNOSIS — F419 Anxiety disorder, unspecified: Secondary | ICD-10-CM

## 2021-03-04 DIAGNOSIS — F250 Schizoaffective disorder, bipolar type: Secondary | ICD-10-CM

## 2021-04-13 ENCOUNTER — Other Ambulatory Visit: Payer: Self-pay

## 2021-04-13 ENCOUNTER — Encounter (HOSPITAL_COMMUNITY): Payer: Self-pay | Admitting: Psychiatry

## 2021-04-13 ENCOUNTER — Ambulatory Visit (INDEPENDENT_AMBULATORY_CARE_PROVIDER_SITE_OTHER): Payer: No Payment, Other | Admitting: Psychiatry

## 2021-04-13 VITALS — BP 121/84 | HR 117 | Ht 64.0 in | Wt 147.0 lb

## 2021-04-13 DIAGNOSIS — F419 Anxiety disorder, unspecified: Secondary | ICD-10-CM

## 2021-04-13 DIAGNOSIS — F900 Attention-deficit hyperactivity disorder, predominantly inattentive type: Secondary | ICD-10-CM

## 2021-04-13 DIAGNOSIS — F250 Schizoaffective disorder, bipolar type: Secondary | ICD-10-CM

## 2021-04-13 MED ORDER — AMPHETAMINE-DEXTROAMPHET ER 10 MG PO CP24: 10.0000 mg | ORAL_CAPSULE | Freq: Every morning | ORAL | 0 refills | Status: DC

## 2021-04-13 MED ORDER — FLUOXETINE HCL 10 MG PO CAPS: ORAL_CAPSULE | ORAL | 1 refills | Status: DC

## 2021-04-13 MED ORDER — LATUDA 60 MG PO TABS
60.0000 mg | ORAL_TABLET | Freq: Every day | ORAL | 1 refills | Status: DC
  Filled 2021-04-13: qty 30, 30d supply, fill #0

## 2021-04-13 MED ORDER — TRAZODONE HCL 50 MG PO TABS: 50.0000 mg | ORAL_TABLET | Freq: Every evening | ORAL | 1 refills | Status: DC | PRN

## 2021-04-13 MED ORDER — FLUOXETINE HCL 20 MG PO CAPS: ORAL_CAPSULE | ORAL | 1 refills | Status: DC

## 2021-04-13 NOTE — Progress Notes (Signed)
BH MD/PA/NP OP Progress Note     04/13/2021 10:17 AM [name]  MRN:  [id number]  Chief Complaint: " I am doing really well."  HPI: Patient reported that she has been doing really well.  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  She stated that she works from 7 AM to 3 PM and then has her online classes and assignments for school after that. She stated that the current combination of medications has helped her immensely and she takes Prozac 30 mg allowing her to get daily and uses trazodone as needed. She was also taking Ativan on a as needed basis however she has been out of the medication for about the last month.  She stated that she has been able to manage without it and she wonders if she can get some hold for as needed usage. She also asked if she can be tested for ADHD because she has a very hard time focusing. The [employer] adult ADHD self-report scale was conducted during the session and based on that she scored very high on questions pertaining to inattention.  She was given a diagnosis of ADHD inattentive type. Writer informed her that there are 2 classes of medications used to treat ADHD wanting stimulant class in the otherwise nonstimulant class.  Writer informed her the advantages and disadvantages of both classes.  She stated that she would like to try Adderall because she is really struggling and her mother also has noticed that. Writer informed her that if she chooses to start Adderall which is a controlled Adult nurse will not be able to prescribe Ativan which is also another controlled substance.  Writer also informed her that there is a possibility that her anxiety could be secondary to her inability to focus.  She stated that since she has not needed Ativan and the last month she thinks she can manage without it and was agreeable to try Adderall.  Adderall XR was offered  and Potential side effects of medication and risks vs benefits of treatment vs non-treatment were explained and discussed. All questions were answered.   Past meds tried: Risperidone- excessive weight gain. Seroquel in the past-made her very tired during the daytime. Took Abilify in the recent past and the dose was titrated up to 15 mg however she did not notice any improvement in her psychotic symptoms. Vraylar- helped initially but did not find it helpful after a few weeks. Lamictal- ineffective. Hydroxyzine- ineffective, Buspar- ineffective. Zoloft- did not feel it was helpful. Celexa- did not find it helpful.   Visit Diagnosis:    ICD-10-CM   1. Schizoaffective disorder, bipolar type ([place])  F25.0 traZODone (DESYREL) 50 MG tablet    FLUoxetine (PROZAC) 10 MG capsule    FLUoxetine (PROZAC) 20 MG capsule    Lurasidone HCl (LATUDA) 60 MG TABS    2. Anxiety  F41.9 FLUoxetine (PROZAC) 10 MG capsule    FLUoxetine (PROZAC) 20 MG capsule    3. Attention deficit hyperactivity disorder (ADHD), predominantly inattentive type  F90.0 amphetamine-dextroamphetamine (ADDERALL XR) 10 MG 24 hr capsule    amphetamine-dextroamphetamine (ADDERALL XR) 10 MG 24 hr capsule      Past Psychiatric History: Mood disorder  Past Medical History:  Past Medical History:  Diagnosis Date   Anxiety    Depression    Genital herpes    HPV (human papilloma virus) anogenital infection  Hypertension 06/22/2017   Migraines     Past Surgical History:  Procedure Laterality Date   BACK SURGERY  2011   scoliosis repair   DG SCOLIOSIS SERIES ([facility] HX)      Family Psychiatric History: denied  Family History:  Family History  Problem Relation Age of Onset   Healthy Mother    Healthy Father    Hypertension Father    Hypertension Paternal Grandfather    Schizophrenia Maternal Grandmother    Diabetes Maternal Aunt    Diabetes Maternal Uncle     Social History:  Social History   Socioeconomic History    Marital status: Single    Spouse name: Not on file   Number of children: Not on file   Years of education: Not on file   Highest education level: Not on file  Occupational History   Not on file  Tobacco Use   Smoking status: Some Days    Packs/day: 0.50    Years: 9.00    Pack years: 4.50    Types: E-cigarettes, Cigarettes   Smokeless tobacco: Never  Substance and Sexual Activity   Alcohol use: Not Currently    Comment: Socially.   Drug use: No   Sexual activity: Yes  Other Topics Concern   Not on file  Social History Narrative   Not on file   Social Determinants of Health   Financial Resource Strain: Not on file  Food Insecurity: Not on file  Transportation Needs: Not on file  Physical Activity: Not on file  Stress: Not on file  Social Connections: Not on file    Allergies: No Known Allergies  Metabolic Disorder Labs: Lab Results  Component Value Date   HGBA1C 5.3 07/09/2017   MPG 105.41 07/09/2017   No results found for: PROLACTIN Lab Results  Component Value Date   CHOL 285 (H) 07/09/2017   TRIG 124 07/09/2017   HDL 56 07/09/2017   CHOLHDL 5.1 07/09/2017   VLDL 25 07/09/2017   LDLCALC 204 (H) 07/09/2017   Lab Results  Component Value Date   TSH 1.421 07/09/2017    Therapeutic Level Labs: No results found for: LITHIUM No results found for: VALPROATE No components found for:  CBMZ  Current Medications: Current Outpatient Medications  Medication Sig Dispense Refill   amphetamine-dextroamphetamine (ADDERALL XR) 10 MG 24 hr capsule Take 1 capsule (10 mg total) by mouth in the morning. 30 capsule 0   [START ON 05/13/2021] amphetamine-dextroamphetamine (ADDERALL XR) 10 MG 24 hr capsule Take 1 capsule (10 mg total) by mouth in the morning. 30 capsule 0   Lurasidone HCl (LATUDA) 60 MG TABS Take 1 tablet (60 mg total) by mouth at bedtime. 30 tablet 1   Lurasidone HCl 60 MG TABS TAKE 1 TABLET (60 MG TOTAL) BY MOUTH DAILY WITH SUPPER. 30 tablet 1   FLUoxetine  (PROZAC) 10 MG capsule Take 1 capsule daily with 20 mg capsule (total dose 30 mg) 30 capsule 1   FLUoxetine (PROZAC) 20 MG capsule TAKE 1 CAPSULE BY MOUTH EVERY DAY 30 capsule 1   Lurasidone HCl (LATUDA) 60 MG TABS Take 1 tablet (60 mg total) by mouth daily with supper. 30 tablet 1   traZODone (DESYREL) 50 MG tablet Take 1 tablet (50 mg total) by mouth at bedtime as needed for sleep. 30 tablet 1   Current Facility-Administered Medications  Medication Dose Route Frequency Provider Last Rate Last Admin   medroxyPROGESTERone (DEPO-PROVERA) injection 150 mg  150 mg Intramuscular Once Hassell Done,  FNP            Psychiatric Specialty Exam: Review of Systems  Gastrointestinal: Negative.    Blood pressure 121/84, pulse (!) 117, height 5' 4"  (1.626 m), weight 147 lb (66.7 kg).Body mass index is 25.23 kg/m.  General Appearance: Well-groomed  Eye Contact: Good  Speech:  Clear and Coherent and Normal Rate  Volume:  Normal  Mood:  Euthymic  Affect:  Congruent  Thought Process:  Goal Directed and Descriptions of Associations: Intact  Orientation:  Full (Time, Place, and Person)  Thought Content: Logical, denies any delusions or hallucinations  Suicidal Thoughts:  No  Homicidal Thoughts:  No  Memory:  Immediate;   Good Recent;   Good  Judgement:  Fair  Insight:  Fair  Psychomotor Activity:  Normal  Concentration:  Concentration: Good and Attention Span: Good  Recall:  Good  Fund of Knowledge: Good  Language: Good  Akathisia:  Negative  Handed:  Right  AIMS (if indicated): not done  Assets:  Communication Skills Desire for Improvement Financial Resources/Insurance Housing Talents/Skills Transportation Vocational/Educational  ADL's:  Intact  Cognition: WNL  Sleep:  Good   Screenings: AIMS    Flowsheet Row Admission (Discharged) from 07/08/2017 in [place] Total Score 0      AUDIT    [place] Admission (Discharged) from 07/08/2017 in  [facility]  Alcohol Use Disorder Identification Test Final Score (AUDIT) 17      CAGE-AID    Flowsheet Row Counselor from 03/24/2020 in [place] Score 3      PHQ2-9    [place] Office Visit from 04/13/2021 in [facility] Office Visit from 04/21/2020 in [facility] Department Office Visit from 06/10/2019 in [place]  PHQ-2 Total Score 0 4 [place] Office Visit from 04/13/2021 in [place] No Risk      ASRS score (6/23): scored high on questions pertaining to inattention.  Assessment and Plan: Patient appears to be doing really well at present.  She struggling with inattention issues.  Adult ADHD self-report scale was conducted and based on that she met criteria for ADHD inattentive type.  She has not needed Ativan in the last month. She is agreeable to trial of Adderall XR to target her ADHD symptoms.  She was explained that she cannot be prescribed to controlled substances from different classes at the same time therefore she will not be prescribed Ativan any more.  She verbalized understanding.  1. Schizoaffective disorder, bipolar type ([place])  - traZODone (DESYREL) 50 MG tablet; Take 1 tablet (50 mg total) by mouth at bedtime as needed for sleep.  Dispense: 30 tablet; Refill: 1 - FLUoxetine (PROZAC) 10 MG capsule; Take 1 capsule daily with 20 mg capsule (total dose 30 mg)  Dispense: 30 capsule; Refill: 1 - FLUoxetine (PROZAC) 20 MG capsule; TAKE 1 CAPSULE BY MOUTH EVERY DAY  Dispense: 30 capsule; Refill: 1 - Lurasidone HCl (LATUDA) 60 MG TABS; Take 1 tablet (60 mg total) by mouth daily with supper.  Dispense: 30 tablet; Refill: 1  2. Anxiety  - FLUoxetine (PROZAC) 10 MG capsule; Take 1 capsule daily with 20 mg capsule (total dose 30 mg)  Dispense: 30 capsule; Refill: 1 - FLUoxetine  (PROZAC) 20 MG capsule; TAKE 1 CAPSULE BY MOUTH EVERY DAY  Dispense: 30 capsule; Refill: 1  3. Attention deficit hyperactivity disorder (ADHD), predominantly inattentive type  - Start amphetamine-dextroamphetamine (ADDERALL XR) 10 MG 24 hr capsule; Take 1 capsule (10 mg total) by mouth in the morning.  Dispense: 30 capsule; Refill: 0 - amphetamine-dextroamphetamine (ADDERALL XR) 10 MG 24 hr capsule; Take 1 capsule (10 mg total) by mouth in the morning.  Dispense: 30 capsule; Refill: 0  F/up in 2 months.  Patient was informed by her leaving the office and therefore her care is being transferred to a different provider in the clinic.  She prefers a female provider.   [place] Crane, MD 04/13/2021, 10:17 AM

## 2021-04-20 ENCOUNTER — Other Ambulatory Visit: Payer: Self-pay

## 2021-04-27 ENCOUNTER — Other Ambulatory Visit (HOSPITAL_COMMUNITY): Payer: Self-pay | Admitting: Psychiatry

## 2021-04-27 DIAGNOSIS — F250 Schizoaffective disorder, bipolar type: Secondary | ICD-10-CM

## 2021-04-27 DIAGNOSIS — F419 Anxiety disorder, unspecified: Secondary | ICD-10-CM

## 2021-06-10 IMAGING — DX RIGHT HAND - COMPLETE 3+ VIEW
3 series · 3 of 3 positions shown · non-contrast
Comparison: None.

CLINICAL DATA: Per pt: every day for the past two weeks, when
waking up in the morning, right lower arm and hand is numb,
tingling, this morning the right hand was swollen. No injury. The
entire right hand and digits are visibly swollen.

EXAM:
RIGHT HAND - COMPLETE 3+ VIEW

[hand pa]
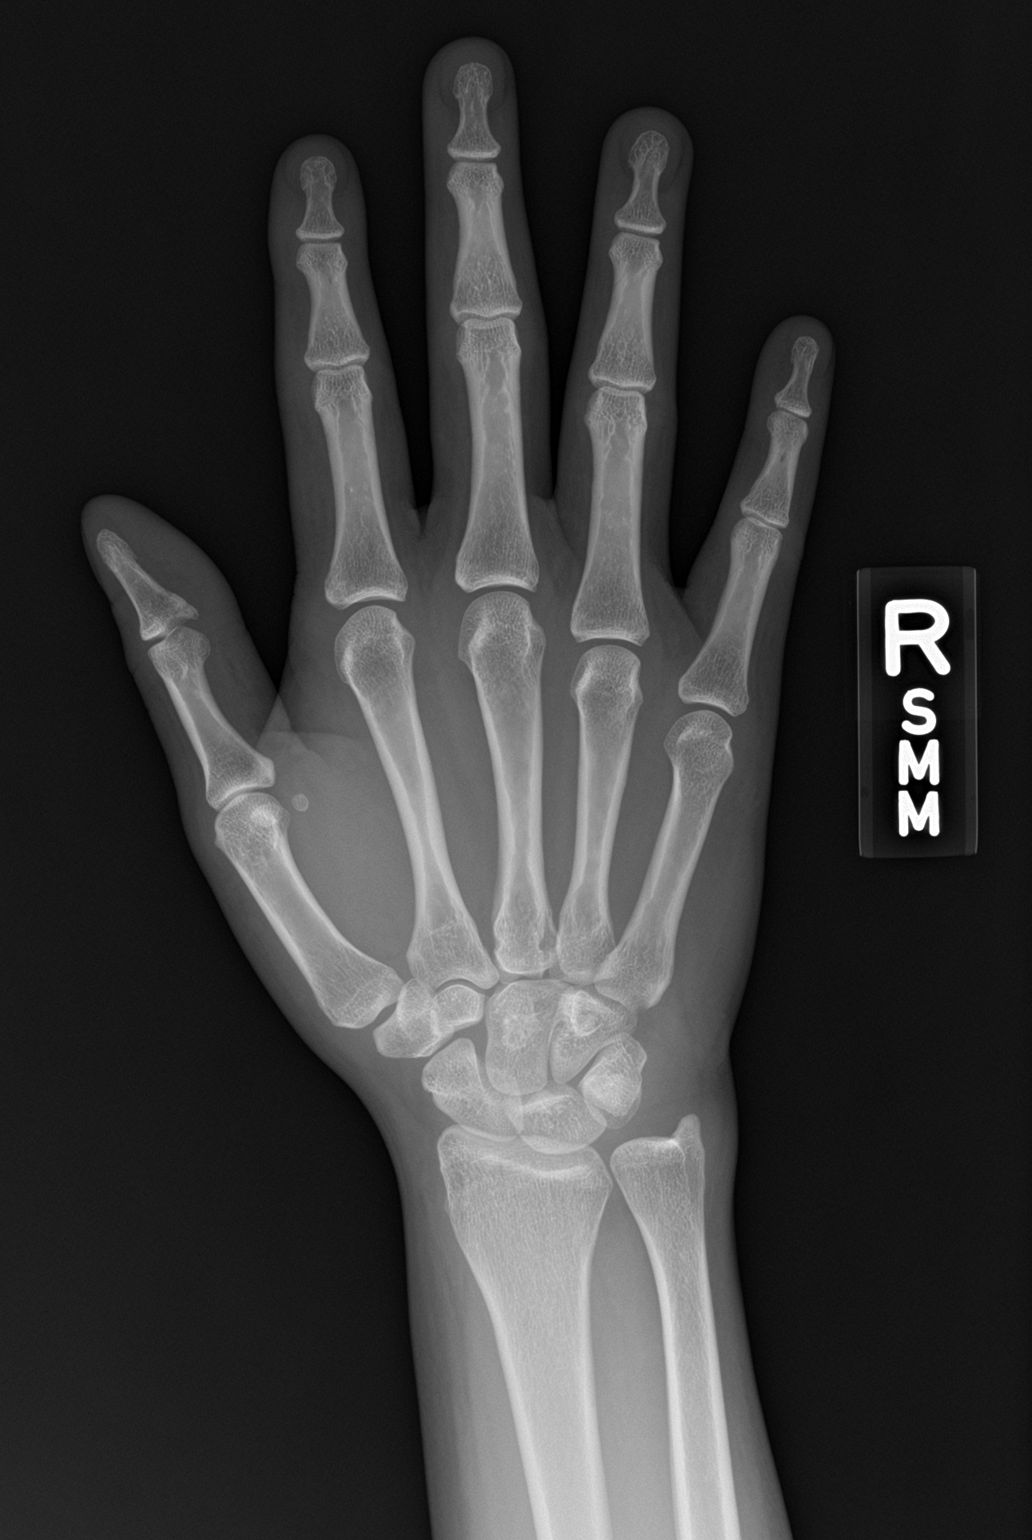

[hand obl]
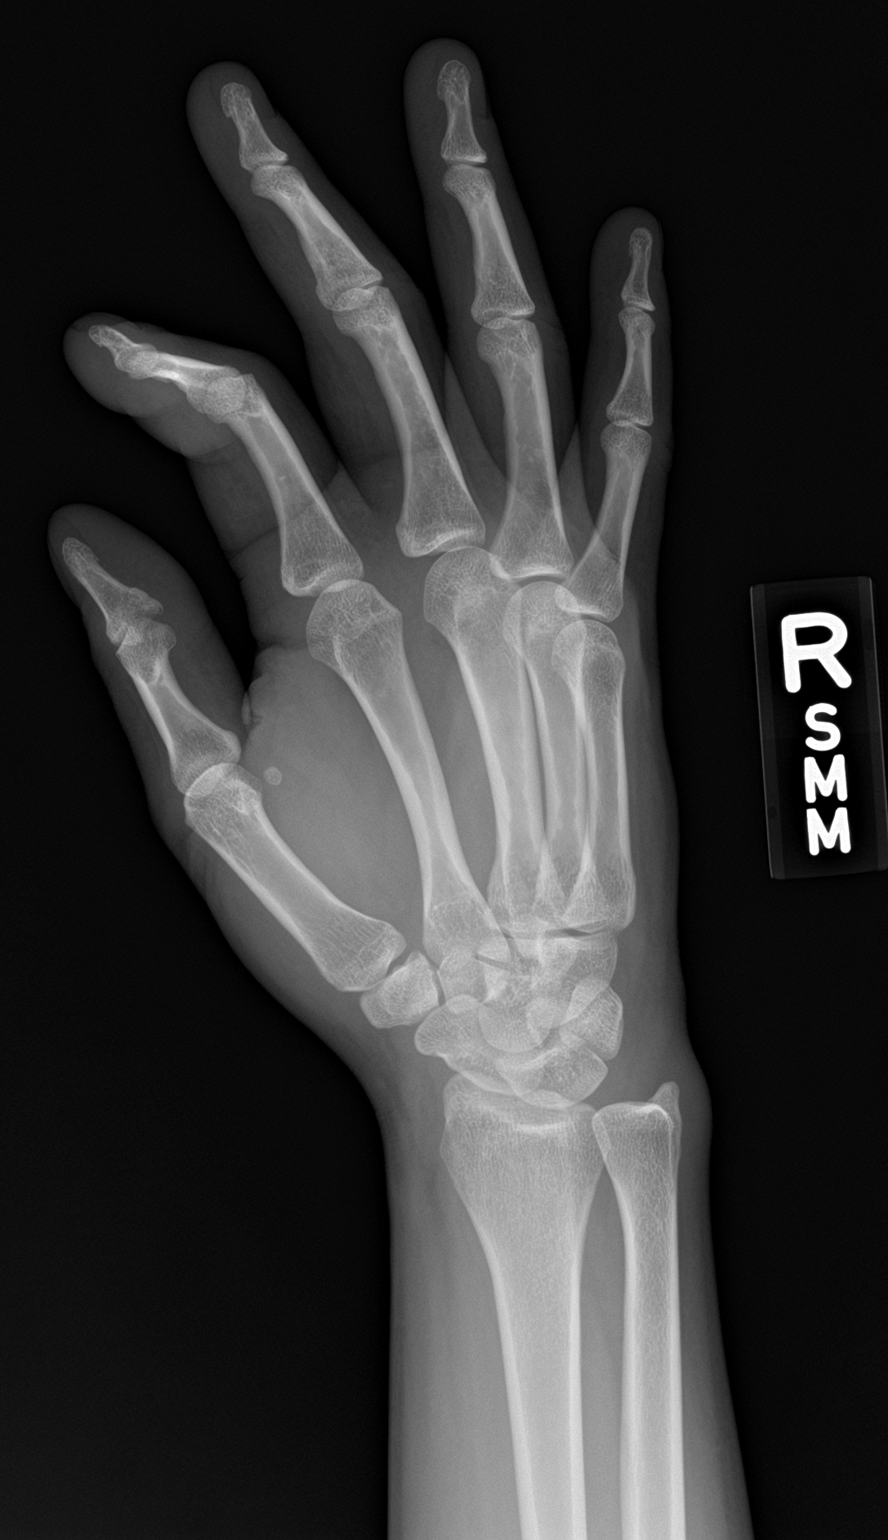

[hand lat]
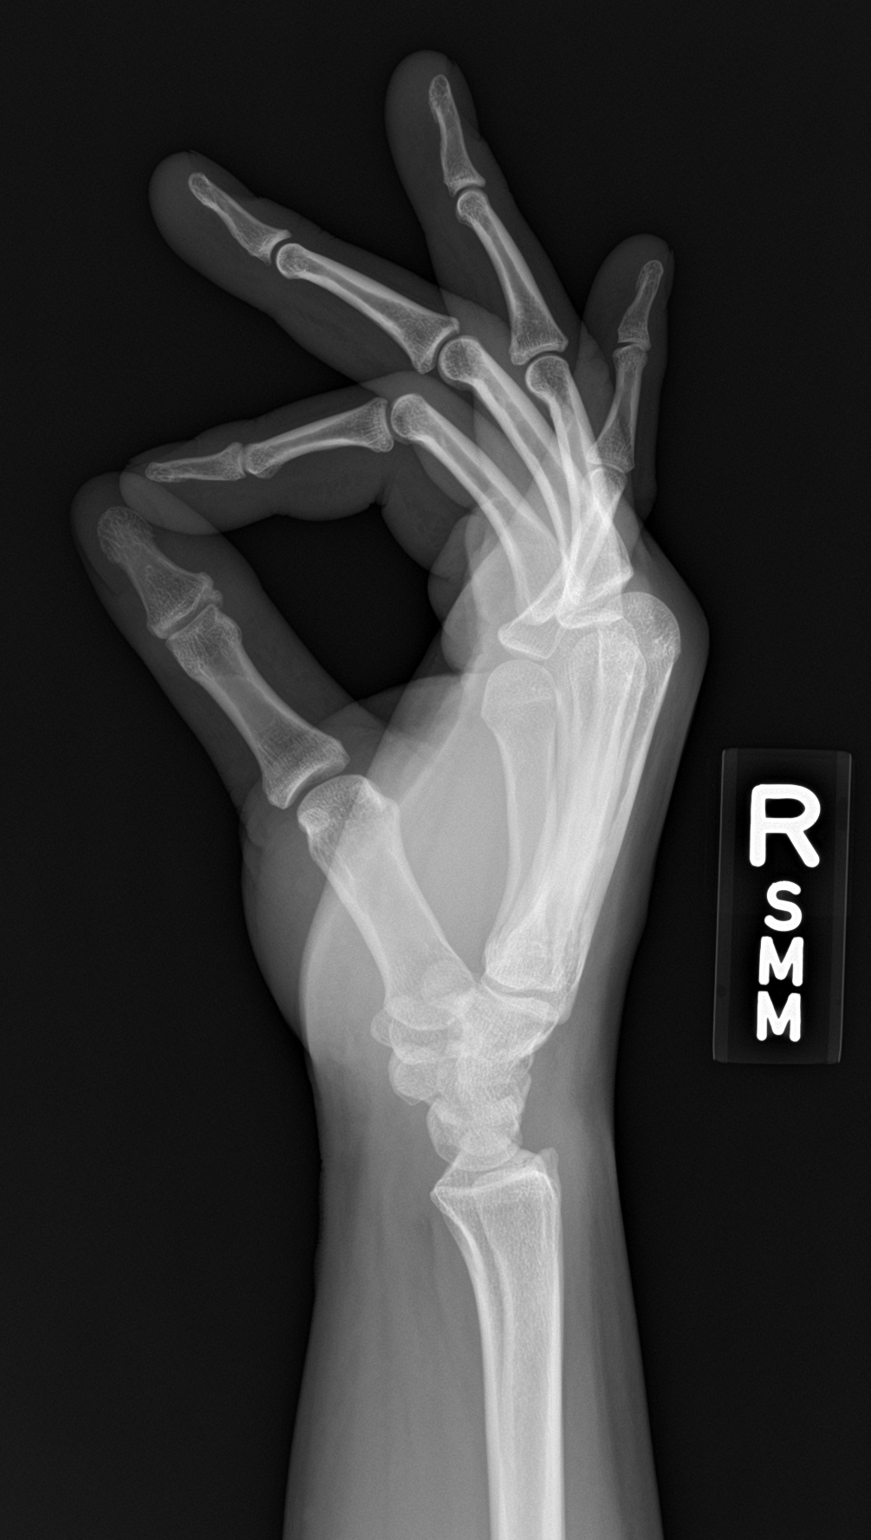

[3 of 3 positions shown; findings below may reference images not displayed]

FINDINGS: There is no evidence of fracture or dislocation. There is no
evidence of arthropathy or other focal bone abnormality. Soft
tissues are unremarkable.
IMPRESSION: Negative.

## 2021-06-20 ENCOUNTER — Encounter (HOSPITAL_COMMUNITY): Payer: No Payment, Other | Admitting: Psychiatry

## 2021-08-08 ENCOUNTER — Other Ambulatory Visit (HOSPITAL_COMMUNITY): Payer: Self-pay | Admitting: Psychiatry

## 2021-08-08 DIAGNOSIS — F250 Schizoaffective disorder, bipolar type: Secondary | ICD-10-CM

## 2021-10-17 ENCOUNTER — Ambulatory Visit (HOSPITAL_COMMUNITY)
Admission: EM | Admit: 2021-10-17 | Discharge: 2021-10-17 | Disposition: A | Discharge status: Home / Self Care | Payer: Self-pay | Attending: Student | Admitting: Student

## 2021-10-17 ENCOUNTER — Encounter (HOSPITAL_COMMUNITY): Payer: Self-pay | Admitting: Emergency Medicine

## 2021-10-17 ENCOUNTER — Other Ambulatory Visit: Payer: Self-pay

## 2021-10-17 DIAGNOSIS — R21 Rash and other nonspecific skin eruption: Secondary | ICD-10-CM

## 2021-10-17 MED ORDER — PREDNISONE 20 MG PO TABS: 40.0000 mg | ORAL_TABLET | Freq: Every day | ORAL | 0 refills | Status: AC

## 2021-10-17 NOTE — Discharge Instructions (Signed)
-  Prednisone, 2 pills taken at the same time for 5 days in a row.  Try taking this earlier in the day as it can give you energy. Avoid NSAIDs like ibuprofen and alleve while taking this medication as they can increase your risk of stomach upset and even GI bleeding when in combination with a steroid. You can continue tylenol (acetaminophen) up to 1000mg  3x daily. -Benedryl (diphenhydramine) 25-50mg  (1-2 pills) as needed for itching, up to every 6 hours.  This medication will cause drowsiness. -You can continue other over-the-counter medications like calamine, hydrocortisone, etc -Follow-up if rash getting worse instead of better- facial involvement, new shortness of breath, etc.

## 2021-10-17 NOTE — ED Triage Notes (Signed)
Pt c/o rash for 2 weeks. Tried OTC medications without relief.

## 2021-10-17 NOTE — ED Provider Notes (Signed)
[facility]    CSN: [id number] Arrival date & time: 10/17/21  0901      History   Chief Complaint Chief Complaint  Patient presents with   Rash    HPI Victoria [name] is a 30 y.o. female presenting with itchy rash for 2 weeks following new polyester onesie.  Medical history noncontributory.  Denies other changes in routine including new soap or laundry detergent.  Describes the rash as pruritic, started on the torso and spread to the arms.  No facial or hand involvement.  Denies facial swelling, shortness of breath, sensation of throat closing, nausea/vomiting/diarrhea.  Has attempted calamine lotion, hydrocortisone lotion, Benadryl p.o., vitamin E lotion.  HPI  Past Medical History:  Diagnosis Date   Anxiety    Depression    Genital herpes    HPV (human papilloma virus) anogenital infection    Hypertension 06/22/2017   Migraines     Patient Active Problem List   Diagnosis Date Noted   Anxiety 03/31/2020   Schizoaffective disorder, bipolar type (HCC) 03/24/2020   Carpal tunnel syndrome of right wrist 06/10/2019   Bipolar 2 disorder (HCC) 08/15/2017   Tobacco use disorder 07/11/2017   PTSD (post-traumatic stress disorder) 07/10/2017   Bipolar 2 disorder, major depressive episode (HCC) 07/08/2017   Social anxiety disorder 07/08/2017   Alcohol use disorder, moderate, dependence (HCC) 07/08/2017    Past Surgical History:  Procedure Laterality Date   BACK SURGERY  2011   scoliosis repair   DG SCOLIOSIS SERIES ([facility] HX)      OB History   No obstetric history on file.      Home Medications    Prior to Admission medications   Medication Sig Start Date End Date Taking? Authorizing Provider  predniSONE (DELTASONE) 20 MG tablet Take 2 tablets (40 mg total) by mouth daily for 5 days. Take with breakfast or lunch. Avoid NSAIDs (ibuprofen, etc) while taking this medication. 10/17/21 10/22/21 Yes Rhys Martini, PA-C  amphetamine-dextroamphetamine  (ADDERALL XR) 10 MG 24 hr capsule Take 1 capsule (10 mg total) by mouth in the morning. 04/13/21   Zena Amos, MD  amphetamine-dextroamphetamine (ADDERALL XR) 10 MG 24 hr capsule Take 1 capsule (10 mg total) by mouth in the morning. 05/13/21   Zena Amos, MD  FLUoxetine (PROZAC) 10 MG capsule TAKE 1 CAPSULE DAILY WITH 20 MG CAPSULE (TOTAL DOSE 30 MG) 04/27/21   Toy Cookey E, NP  FLUoxetine (PROZAC) 20 MG capsule TAKE 1 CAPSULE BY MOUTH EVERY DAY 04/27/21   Shanna Cisco, NP  Lurasidone HCl (LATUDA) 60 MG TABS Take 1 tablet (60 mg total) by mouth at bedtime. 10/26/20   Zena Amos, MD  Lurasidone HCl (LATUDA) 60 MG TABS Take 1 tablet (60 mg total) by mouth daily with supper. 04/13/21   Zena Amos, MD  Lurasidone HCl 60 MG TABS TAKE 1 TABLET (60 MG TOTAL) BY MOUTH DAILY WITH SUPPER. 12/06/20 12/06/21  Zena Amos, MD  traZODone (DESYREL) 50 MG tablet Take 1 tablet (50 mg total) by mouth at bedtime as needed for sleep. 04/13/21   Zena Amos, MD  citalopram (CELEXA) 40 MG tablet Take 1 tablet (40 mg total) by mouth daily. Patient not taking: Reported on 05/06/2019 07/18/18 06/05/19  Raeford Razor, MD  lamoTRIgine (LAMICTAL) 25 MG tablet Take 1 tablet (25 mg total) by mouth at bedtime. Patient not taking: Reported on 05/06/2019 07/18/18 06/05/19  Raeford Razor, MD  prazosin (MINIPRESS) 1 MG capsule Take 1 capsule (1 mg total)  by mouth 2 (two) times daily. Patient not taking: No sig reported 07/18/18 06/05/19  Raeford Razor, MD  promethazine (PHENERGAN) 12.5 MG tablet Take 12.5 mg by mouth daily as needed for nausea/vomiting. 07/15/18 06/05/19  [provider]  risperiDONE (RISPERDAL) 2 MG tablet Take 1 tablet (2 mg total) by mouth at bedtime. Patient not taking: Reported on 05/06/2019 07/18/18 06/05/19  Raeford Razor, MD  rizatriptan (MAXALT-MLT) 5 MG disintegrating tablet Take 5 mg by mouth daily as needed for migraine. 07/15/18 06/05/19  [provider]  sertraline (ZOLOFT) 50  MG tablet Take 50 mg by mouth daily.    12/27/11  [provider]  venlafaxine XR (EFFEXOR-XR) 37.5 MG 24 hr capsule Take 1 capsule by mouth daily. 07/15/18 06/05/19  [provider]    Family History Family History  Problem Relation Age of Onset   Healthy Mother    Healthy Father    Hypertension Father    Hypertension Paternal Grandfather    Schizophrenia Maternal Grandmother    Diabetes Maternal Aunt    Diabetes Maternal Uncle     Social History Social History   Tobacco Use   Smoking status: Some Days    Packs/day: 0.50    Years: 9.00    Pack years: 4.50    Types: E-cigarettes, Cigarettes   Smokeless tobacco: Never  Substance Use Topics   Alcohol use: Not Currently    Comment: Socially.   Drug use: No     Allergies   Patient has no known allergies.   Review of Systems Review of Systems  Skin:  Positive for rash.  All other systems reviewed and are negative.   Physical Exam Triage Vital Signs ED Triage Vitals [10/17/21 0927]  Enc Vitals Group     BP 119/87     Pulse Rate 78     Resp 14     Temp 98.2 F (36.8 C)     Temp Source Oral     SpO2 98 %     Weight      Height      Head Circumference      Peak Flow      Pain Score 0     Pain Loc      Pain Edu?      Excl. in GC?    No data found.  Updated Vital Signs BP 119/87 (BP Location: Right Arm)    Pulse 78    Temp 98.2 F (36.8 C) (Oral)    Resp 14    LMP 10/16/2021    SpO2 98%   Visual Acuity Right Eye Distance:   Left Eye Distance:   Bilateral Distance:    Right Eye Near:   Left Eye Near:    Bilateral Near:     Physical Exam Vitals reviewed.  Constitutional:      General: She is not in acute distress.    Appearance: Normal appearance. She is not ill-appearing.  HENT:     Head: Normocephalic and atraumatic.  Pulmonary:     Effort: Pulmonary effort is normal.  Skin:    Comments: Scattered hives torso and forearms in various stages of healing. No facial or  hand/interdigital web involvement. No facial/lip/tongue/uvula swelling, airway is patent.  Neurological:     General: No focal deficit present.     Mental Status: She is alert and oriented to person, place, and time.  Psychiatric:        Mood and Affect: Mood normal.  Behavior: Behavior normal.        Thought Content: Thought content normal.        Judgment: Judgment normal.     UC Treatments / Results  Labs (all labs ordered are listed, but only abnormal results are displayed) Labs Reviewed - No data to display  EKG   Radiology No results found.  Procedures Procedures (including critical care time)  Medications Ordered in UC Medications - No data to display  Initial Impression / Assessment and Plan / UC Course  I have reviewed the triage vital signs and the nursing notes.  Pertinent labs & imaging results that were available during my care of the patient were reviewed by me and considered in my medical decision making (see chart for details).     This patient is a very pleasant 30 y.o. year old female presenting with rash - suspect contact dermatitis related to polyester garment. Trial of prednisone PO, continue OTC medications. ED return precautions discussed. Patient verbalizes understanding and agreement.  .   Final Clinical Impressions(s) / UC Diagnoses   Final diagnoses:  Rash and nonspecific skin eruption     Discharge Instructions      -Prednisone, 2 pills taken at the same time for 5 days in a row.  Try taking this earlier in the day as it can give you energy. Avoid NSAIDs like ibuprofen and alleve while taking this medication as they can increase your risk of stomach upset and even GI bleeding when in combination with a steroid. You can continue tylenol (acetaminophen) up to 1000mg  3x daily. -Benedryl (diphenhydramine) 25-50mg  (1-2 pills) as needed for itching, up to every 6 hours.  This medication will cause drowsiness. -You can continue other  over-the-counter medications like calamine, hydrocortisone, etc -Follow-up if rash getting worse instead of better- facial involvement, new shortness of breath, etc.      ED Prescriptions     Medication Sig Dispense Auth. Provider   predniSONE (DELTASONE) 20 MG tablet Take 2 tablets (40 mg total) by mouth daily for 5 days. Take with breakfast or lunch. Avoid NSAIDs (ibuprofen, etc) while taking this medication. 10 tablet , PA-C      PDMP not reviewed this encounter.   Rhys Martini, PA-C 10/17/21 [phone]

## 2021-10-26 ENCOUNTER — Ambulatory Visit (HOSPITAL_COMMUNITY)
Admission: EM | Admit: 2021-10-26 | Discharge: 2021-10-26 | Disposition: A | Discharge status: Home / Self Care | Payer: Self-pay | Attending: Emergency Medicine | Admitting: Emergency Medicine

## 2021-10-26 ENCOUNTER — Other Ambulatory Visit: Payer: Self-pay

## 2021-10-26 ENCOUNTER — Encounter (HOSPITAL_COMMUNITY): Payer: Self-pay

## 2021-10-26 DIAGNOSIS — R21 Rash and other nonspecific skin eruption: Secondary | ICD-10-CM

## 2021-10-26 MED ORDER — HYDROXYZINE HCL 25 MG PO TABS: 25.0000 mg | ORAL_TABLET | Freq: Four times a day (QID) | ORAL | 0 refills | Status: DC | PRN

## 2021-10-26 MED ORDER — DEXAMETHASONE SODIUM PHOSPHATE 10 MG/ML IJ SOLN
INTRAMUSCULAR | Status: AC
  Filled 2021-10-26: qty 1

## 2021-10-26 MED ORDER — DEXAMETHASONE SODIUM PHOSPHATE 10 MG/ML IJ SOLN
10.0000 mg | Freq: Once | INTRAMUSCULAR | Status: AC
  Administered 2021-10-26: 10 mg via INTRAMUSCULAR

## 2021-10-26 NOTE — ED Triage Notes (Signed)
Pt reports skin rashes all over her body. States she was here on 12/27. States she given a steroid and states the rash has worsened.

## 2021-10-26 NOTE — ED Provider Notes (Signed)
HPI  SUBJECTIVE:  Victoria [name] is a 31 y.o. female who presents with a worsening erythematous, papular and flat annular rash that started off as several bumps on her torso on 12/20.  It has since spread to her arms, back, face.  She does not recall a herald patch.  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  No new lotions, soaps, detergents, change in medications, recent antibiotics, new or different supplements.  She does not wear perfumes.  No known exposure to poison ivy, poison oak.  It itches all day.  No sensation of being bitten at night, blood on the bedclothes in the morning, exposure to fleas.  Patient was seen here on 12/27 for this.  Was thought to have a contact dermatitis, was given prednisone p.o., advised to continue OTC antihistamines.  She states the prednisone did not help, she has been taking Benadryl at night and has tried hydrocortisone cream after showering.  No alleviating factors.  Symptoms worse with standing with hot water.  She has a past medical history of schizoaffective disorder.  No other medical problems.  LMP: Monday.  Denies possibility being pregnant.  PMD: None  Past Medical History:  Diagnosis Date   Anxiety    Depression    Genital herpes    HPV (human papilloma virus) anogenital infection    Hypertension 06/22/2017   Migraines     Past Surgical History:  Procedure Laterality Date   BACK SURGERY  2011   scoliosis repair   DG SCOLIOSIS SERIES ([facility] HX)      Family History  Problem Relation Age of Onset   Healthy Mother    Healthy Father    Hypertension Father    Hypertension Paternal Grandfather    Schizophrenia Maternal Grandmother    Diabetes Maternal Aunt    Diabetes Maternal Uncle     Social History   Tobacco Use   Smoking status: Some Days    Packs/day: 0.50    Years: 9.00    Pack years: 4.50    Types: E-cigarettes, Cigarettes   Smokeless tobacco: Never  Substance Use Topics   Alcohol use: Not  Currently    Comment: Socially.   Drug use: No     Current Facility-Administered Medications:    dexamethasone (DECADRON) injection 10 mg, 10 mg, Intramuscular, Once, Domenick Gong, MD   medroxyPROGESTERone (DEPO-PROVERA) injection 150 mg, 150 mg, Intramuscular, Once, Larene Pickett, FNP  Current Outpatient Medications:    hydrOXYzine (ATARAX) 25 MG tablet, Take 1 tablet (25 mg total) by mouth every 6 (six) hours as needed for itching., Disp: 30 tablet, Rfl: 0   amphetamine-dextroamphetamine (ADDERALL XR) 10 MG 24 hr capsule, Take 1 capsule (10 mg total) by mouth in the morning., Disp: 30 capsule, Rfl: 0   amphetamine-dextroamphetamine (ADDERALL XR) 10 MG 24 hr capsule, Take 1 capsule (10 mg total) by mouth in the morning., Disp: 30 capsule, Rfl: 0   FLUoxetine (PROZAC) 10 MG capsule, TAKE 1 CAPSULE DAILY WITH 20 MG CAPSULE (TOTAL DOSE 30 MG), Disp: 90 capsule, Rfl: 1   FLUoxetine (PROZAC) 20 MG capsule, TAKE 1 CAPSULE BY MOUTH EVERY DAY, Disp: 90 capsule, Rfl: 1   Lurasidone HCl (LATUDA) 60 MG TABS, Take 1 tablet (60 mg total) by mouth at bedtime., Disp: 30 tablet, Rfl: 1   Lurasidone HCl (LATUDA) 60 MG TABS, Take 1 tablet (60 mg total) by mouth daily with supper., Disp: 30 tablet, Rfl: 1  Lurasidone HCl 60 MG TABS, TAKE 1 TABLET (60 MG TOTAL) BY MOUTH DAILY WITH SUPPER., Disp: 30 tablet, Rfl: 1   traZODone (DESYREL) 50 MG tablet, Take 1 tablet (50 mg total) by mouth at bedtime as needed for sleep., Disp: 30 tablet, Rfl: 1  No Known Allergies   ROS  As noted in HPI.   Physical Exam  BP 121/77 (BP Location: Left Arm)    Pulse 100    Temp 98.2 F (36.8 C) (Oral)    Resp 17    LMP 10/16/2021    SpO2 100%   Constitutional: Well developed, well nourished, no acute distress Eyes:  EOMI, conjunctiva normal bilaterally HENT: Normocephalic, atraumatic,mucus membranes moist Respiratory: Normal inspiratory effort Cardiovascular: Normal rate GI: nondistended skin: Erythematous  papules over arms, torso, face, back.  Flat annular nontender hyperpigmented rash over torso, arms. No Burrows between fingers.  No crusting              Musculoskeletal: no deformities Neurologic: Alert & oriented x 3, no focal neuro deficits Psychiatric: Speech and behavior appropriate   ED Course   Medications  dexamethasone (DECADRON) injection 10 mg (has no administration in time range)    Orders Placed This Encounter  Procedures   Ambulatory referral to Dermatology    Referral Priority:   Routine    Referral Type:   Consultation    Referral Reason:   Specialty Services Required    Requested Specialty:   Dermatology    Number of Visits Requested:   1    No results found for this or any previous visit (from the past 24 hour(s)). No results found.  ED Clinical Impression  1. Rash      ED Assessment/Plan  Some of the lesions are consistent with a pityriasis rosea, but the small papules do not appear to be pityriasis.  It does not appear to be a life-threatening or contagious rash.  Will give 10 mg dexamethasone here, have her start Claritin or Zyrtec, and if that does not work, Atarax.  [facility].  We will also provide primary care list and order assistance in finding a PMD  Previous records reviewed.  As noted in HPI.    Discussed MDM, treatment plan, and plan for follow-up with patient.  patient agrees with plan.   Meds ordered this encounter  Medications   dexamethasone (DECADRON) injection 10 mg   hydrOXYzine (ATARAX) 25 MG tablet    Sig: Take 1 tablet (25 mg total) by mouth every 6 (six) hours as needed for itching.    Dispense:  30 tablet    Refill:  0      *This clinic note was created using Scientist, clinical (histocompatibility and immunogenetics). Therefore, there may be occasional mistakes despite careful proofreading.  ?    Domenick Gong, MD 10/26/21 [phone]

## 2021-10-26 NOTE — Discharge Instructions (Addendum)
I have put in a referral to the [facility] dermatology clinic.  Follow-up with them as soon as you can.  They can also provide primary care.  Below is a list of primary care practices who are taking new patients for you to follow-up with.  [facility] -multiple locations.  See website at [email or web address]  [facility] internal medicine clinic Ground Floor - [facility], [address] [place], [place], [place] [address] [phone]  [facility] [address] [place], [place] [address] [phone]  [facility] [address] [place], [place] [address] [phone]  [facility] [phone] [address] [place] [address]  [facility]: [address] [place] [address]  [phone]  [facility]: [address] [place] [address]  [phone]  [facility] : [address] [place] [address] [phone]  [facility]: [address] [place] [address] [phone]  [facility]: [address] [place] [address] [phone]  Dr. Oneal Grout [address] [facility] [place] [address]  [phone]  Go to www.goodrx.com  or www.costplusdrugs.com to look up your medications. This will give you a list of where you can find your prescriptions at the most affordable prices. Or ask the pharmacist what the cash price is, or if they have any other discount programs available to help make your medication more affordable. This can be less expensive than what you would pay with insurance.

## 2021-11-16 ENCOUNTER — Ambulatory Visit (INDEPENDENT_AMBULATORY_CARE_PROVIDER_SITE_OTHER): Payer: Self-pay | Admitting: Student

## 2021-11-16 ENCOUNTER — Other Ambulatory Visit: Payer: Self-pay

## 2021-11-16 VITALS — BP 113/79 | HR 103 | Ht 63.0 in | Wt 153.0 lb

## 2021-11-16 DIAGNOSIS — L810 Postinflammatory hyperpigmentation: Secondary | ICD-10-CM

## 2021-11-16 DIAGNOSIS — L42 Pityriasis rosea: Secondary | ICD-10-CM

## 2021-11-16 MED ORDER — HYDROQUINONE 4 % EX CREA: TOPICAL_CREAM | Freq: Two times a day (BID) | CUTANEOUS | 0 refills | Status: DC

## 2021-11-16 NOTE — Patient Instructions (Addendum)
Ms. Bolon, It is such a joy to take care you! Thank you for coming in today.   As a reminder, here is a recap of what we talked about today:  - I think that your symptoms are consistent with a condition called pityriasis rosea - I am sending a prescription for a cream that should help with the discoloration of your skin. You can use this twice daily, you can continue using the cream until 1) you are satisfied with the result or 2) you finish the tube. You may notice that your skin gets lighter while using the cream. Your skin should return to its normal color when you stop the cream.  Take care and seek immediate care sooner if you develop any concerns.   Eliezer Mccoy, MD [facility]

## 2021-11-16 NOTE — Assessment & Plan Note (Addendum)
Given the time course, self-limiting nature, and apparent "herald patch" history, suspect that her symptoms were caused by pityriasis rosea.  Residual hyperpigmented patches likely represent postinflammatory hyperpigmentation. -Prescribed hydroquinone cream for postinflammatory hyperpigmentation given that symptoms are distressing to patient. Counseled on possible side effect of hypopigmentation and advised her to stop use if this is undesirable to her. -Advised establishing with PCP

## 2021-11-16 NOTE — Progress Notes (Addendum)
° ° °  SUBJECTIVE:   CHIEF COMPLAINT / HPI:   Rash Patient was referred to dermatology clinic by urgent care after she was seen there on 1/05 for a diffuse rash.  Rash began on her torso on 12/20 and then spread to her arms, legs, back.  It was intensely pruritic.  She denied any exposure to known irritants (poison ivy poison oak, allergens, animals).  She was given a course of prednisone and advised to use OTC antihistamines by UC.  She did not find that these were helpful but did find that the rash cleared on its own.  Her only complaint now is residual hyperpigmentation most noticeable on her upper extremities.  This is quite bothersome to her as it is in an exposed area and she is asking for "something to help with it."  She has a past medical history of schizoaffective disorder but is not currently taking any of her medications so there is no concern for drug rash.   OBJECTIVE:   BP 113/79    Pulse (!) 103    Ht 5\' 3"  (1.6 m)    Wt 153 lb (69.4 kg)    LMP  (LMP Unknown)    SpO2 100%    BMI 27.10 kg/m   Physical Exam Constitutional:      General: She is not in acute distress. Skin:    Comments: Abdomen, chest, legs without rash.  Scattered nonpruritic hyperpigmented patches on bilateral upper extremities without erythema or scale.   The papules and plaques that were documented in photos from her urgent care visit have completely resolved.  Neurological:     Mental Status: She is alert.     ASSESSMENT/PLAN:   Pityriasis rosea Given the time course, self-limiting nature, and apparent "herald patch" history, suspect that her symptoms were caused by pityriasis rosea.  Residual hyperpigmented patches likely represent postinflammatory hyperpigmentation. -Prescribed hydroquinone cream for postinflammatory hyperpigmentation given that symptoms are distressing to patient. Counseled on possible side effect of hypopigmentation and advised her to stop use if this is undesirable to her. -Advised  establishing with PCP   , MD [facility]

## 2022-05-31 ENCOUNTER — Ambulatory Visit (HOSPITAL_COMMUNITY)
Admission: EM | Admit: 2022-05-31 | Discharge: 2022-05-31 | Disposition: A | Discharge status: Home / Self Care | Payer: Self-pay | Attending: Internal Medicine | Admitting: Internal Medicine

## 2022-05-31 ENCOUNTER — Encounter (HOSPITAL_COMMUNITY): Payer: Self-pay | Admitting: *Deleted

## 2022-05-31 DIAGNOSIS — Z20822 Contact with and (suspected) exposure to covid-19: Secondary | ICD-10-CM | POA: Insufficient documentation

## 2022-05-31 DIAGNOSIS — R051 Acute cough: Secondary | ICD-10-CM | POA: Insufficient documentation

## 2022-05-31 DIAGNOSIS — U071 COVID-19: Secondary | ICD-10-CM | POA: Insufficient documentation

## 2022-05-31 LAB — SARS CORONAVIRUS 2 (TAT 6-24 HRS): SARS Coronavirus 2: POSITIVE — AB

## 2022-05-31 MED ORDER — BENZONATATE 100 MG PO CAPS: 100.0000 mg | ORAL_CAPSULE | Freq: Three times a day (TID) | ORAL | 0 refills | Status: DC

## 2022-05-31 MED ORDER — ONDANSETRON 4 MG PO TBDP: 4.0000 mg | ORAL_TABLET | Freq: Three times a day (TID) | ORAL | 0 refills | Status: DC | PRN

## 2022-05-31 MED ORDER — IBUPROFEN 800 MG PO TABS
800.0000 mg | ORAL_TABLET | Freq: Once | ORAL | Status: AC
  Administered 2022-05-31: 800 mg via ORAL

## 2022-05-31 MED ORDER — IBUPROFEN 800 MG PO TABS: 800.0000 mg | ORAL_TABLET | Freq: Three times a day (TID) | ORAL | 0 refills | Status: DC

## 2022-05-31 MED ORDER — IBUPROFEN 800 MG PO TABS
ORAL_TABLET | ORAL | Status: AC
  Filled 2022-05-31: qty 1

## 2022-05-31 NOTE — ED Triage Notes (Addendum)
C/O cough onset 5 days ago; yesterday started with diarrhea and body aches. Reports 2 positive home Covid tests. Would like confirmation of positive tests.Taking IBU with some relief.

## 2022-05-31 NOTE — Discharge Instructions (Addendum)
You have COVID-19. Quarantine until tomorrow.  After tomorrow, please wear a mask whenever you are in public until day 10 of your symptoms which will be on June 05, 2022.  I have provided a work note for you to return to work on Sunday, June 03, 2022.   May take ibuprofen 800 mg every 8 hours as needed for body aches, chills, and fever at home.  Take this with food to avoid stomach upset.  Next dose of ibuprofen may be tonight at 630 since you were given some in the clinic today.  Use Tessalon Perles every 8 hours as needed for your cough.  You may use Zofran 4 mg every 8 hours as needed for nausea and vomiting.  Increase your fluid intake to at least 8 cups of water per day to stay well-hydrated.  Eat a bland diet over the next 12 to 24 hours such as white bread, toast, rice, applesauce, bananas, and other light foods to avoid upsetting her stomach.   If you develop worsening cough, shortness of breath, chest pain, or difficulty breathing, please return to urgent care or go to the emergency room if your symptoms are severe.  I hope you feel better!

## 2022-05-31 NOTE — ED Provider Notes (Signed)
[facility]    CSN: [id number] Arrival date & time: 05/31/22  0825      History   Chief Complaint Chief Complaint  Patient presents with   Cough    Positive home Covid test   Diarrhea    HPI Victoria [name] is a 31 y.o. female.   Patient presents urgent care for evaluation of cough, fatigue, chills, and generalized bodyaches that started on Sunday, May 27, 2022.  Patient was at work when her symptoms started and she immediately started to wear a mask.  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  Symptoms began as a cough and nasal congestion then progressed to nausea, vomiting, and diarrhea yesterday.  Patient states that yesterday she had approximately 4-5 episodes of diarrhea but denies blood or mucus to the stools and emesis.  She had 1 episode of emesis yesterday as well and a decreased appetite.  Patient states that her appetite has improved today.  Denies fever at home but does report significant chills and bodyaches for which she has been taking 800 mg of ibuprofen every 8 hours at home with improvement of symptoms.  Last dose of ibuprofen was last night prior to going to bed.  Patient woke up this morning and took a COVID test at home which resulted positive.  She has not attempted use of any other over-the-counter cough and cold medications prior to arrival urgent care for her symptoms other than ibuprofen.  Denies shortness of breath, chest pain, difficulty breathing, pain with deep inspiration, dizziness, abdominal pain, urinary symptoms, ear pain, headache, eye drainage, sore throat, and neck pain.  No other aggravating or relieving factors identified at this time per patient symptoms.  She has not been back to work since her symptoms started on Sunday, May 27, 2022.   Cough Diarrhea   Past Medical History:  Diagnosis Date   Anxiety    Depression    Genital herpes    HPV (human  papilloma virus) anogenital infection    Hypertension 06/22/2017   Migraines     Patient Active Problem List   Diagnosis Date Noted   Pityriasis rosea 11/16/2021   Postinflammatory hyperpigmentation 11/16/2021   Anxiety 03/31/2020   Schizoaffective disorder, bipolar type (HCC) 03/24/2020   Carpal tunnel syndrome of right wrist 06/10/2019   Bipolar 2 disorder (HCC) 08/15/2017   Tobacco use disorder 07/11/2017   PTSD (post-traumatic stress disorder) 07/10/2017   Bipolar 2 disorder, major depressive episode (HCC) 07/08/2017   Social anxiety disorder 07/08/2017   Alcohol use disorder, moderate, dependence (HCC) 07/08/2017    Past Surgical History:  Procedure Laterality Date   BACK SURGERY  2011   scoliosis repair   DG SCOLIOSIS SERIES ([facility] HX)      OB History   No obstetric history on file.      Home Medications    Prior to Admission medications   Medication Sig Start Date End Date Taking? Authorizing Provider  benzonatate (TESSALON) 100 MG capsule Take 1 capsule (100 mg total) by mouth every 8 (eight) hours. 05/31/22  Yes Carlisle Beers, FNP  ibuprofen (ADVIL) 800 MG tablet Take 1 tablet (800 mg total) by mouth 3 (three) times daily. 05/31/22  Yes Carlisle Beers, FNP  ondansetron (ZOFRAN-ODT) 4 MG disintegrating tablet Take 1 tablet (4 mg total) by mouth every 8 (eight) hours as needed for nausea or vomiting. 05/31/22  Yes Carlisle Beers,  FNP  amphetamine-dextroamphetamine (ADDERALL XR) 10 MG 24 hr capsule Take 1 capsule (10 mg total) by mouth in the morning. 04/13/21   Zena Amos, MD  amphetamine-dextroamphetamine (ADDERALL XR) 10 MG 24 hr capsule Take 1 capsule (10 mg total) by mouth in the morning. 05/13/21   Zena Amos, MD  FLUoxetine (PROZAC) 10 MG capsule TAKE 1 CAPSULE DAILY WITH 20 MG CAPSULE (TOTAL DOSE 30 MG) 04/27/21   Toy Cookey E, NP  FLUoxetine (PROZAC) 20 MG capsule TAKE 1 CAPSULE BY MOUTH EVERY DAY 04/27/21   Toy Cookey E, NP   hydroquinone 4 % cream Apply topically 2 (two) times daily. 11/16/21   Alicia Amel, MD  hydrOXYzine (ATARAX) 25 MG tablet Take 1 tablet (25 mg total) by mouth every 6 (six) hours as needed for itching. 10/26/21   Domenick Gong, MD  Lurasidone HCl (LATUDA) 60 MG TABS Take 1 tablet (60 mg total) by mouth at bedtime. 10/26/20   Zena Amos, MD  Lurasidone HCl (LATUDA) 60 MG TABS Take 1 tablet (60 mg total) by mouth daily with supper. 04/13/21   Zena Amos, MD  Lurasidone HCl 60 MG TABS TAKE 1 TABLET (60 MG TOTAL) BY MOUTH DAILY WITH SUPPER. 12/06/20 12/06/21  Zena Amos, MD  traZODone (DESYREL) 50 MG tablet Take 1 tablet (50 mg total) by mouth at bedtime as needed for sleep. 04/13/21   Zena Amos, MD  citalopram (CELEXA) 40 MG tablet Take 1 tablet (40 mg total) by mouth daily. Patient not taking: Reported on 05/06/2019 07/18/18 06/05/19  Raeford Razor, MD  lamoTRIgine (LAMICTAL) 25 MG tablet Take 1 tablet (25 mg total) by mouth at bedtime. Patient not taking: Reported on 05/06/2019 07/18/18 06/05/19  Raeford Razor, MD  prazosin (MINIPRESS) 1 MG capsule Take 1 capsule (1 mg total) by mouth 2 (two) times daily. Patient not taking: No sig reported 07/18/18 06/05/19  Raeford Razor, MD  promethazine (PHENERGAN) 12.5 MG tablet Take 12.5 mg by mouth daily as needed for nausea/vomiting. 07/15/18 06/05/19  [provider]  risperiDONE (RISPERDAL) 2 MG tablet Take 1 tablet (2 mg total) by mouth at bedtime. Patient not taking: Reported on 05/06/2019 07/18/18 06/05/19  Raeford Razor, MD  rizatriptan (MAXALT-MLT) 5 MG disintegrating tablet Take 5 mg by mouth daily as needed for migraine. 07/15/18 06/05/19  [provider]  sertraline (ZOLOFT) 50 MG tablet Take 50 mg by mouth daily.    12/27/11  [provider]  venlafaxine XR (EFFEXOR-XR) 37.5 MG 24 hr capsule Take 1 capsule by mouth daily. 07/15/18 06/05/19  [provider]    Family History Family History  Problem Relation  Age of Onset   Healthy Mother    Healthy Father    Hypertension Father    Hypertension Paternal Grandfather    Schizophrenia Maternal Grandmother    Diabetes Maternal Aunt    Diabetes Maternal Uncle     Social History Social History   Tobacco Use   Smoking status: Some Days    Packs/day: 0.50    Years: 9.00    Total pack years: 4.50    Types: E-cigarettes, Cigarettes   Smokeless tobacco: Never  Vaping Use   Vaping Use: Some days  Substance Use Topics   Alcohol use: Yes    Comment: socially   Drug use: Not Currently    Types: Marijuana     Allergies   Patient has no known allergies.   Review of Systems Review of Systems  Respiratory:  Positive for cough.  Gastrointestinal:  Positive for diarrhea.  Per HPI   Physical Exam Triage Vital Signs ED Triage Vitals [05/31/22 0921]  Enc Vitals Group     BP 114/80     Pulse Rate 83     Resp 16     Temp 98.5 F (36.9 C)     Temp Source Oral     SpO2 98 %     Weight      Height      Head Circumference      Peak Flow      Pain Score 5     Pain Loc      Pain Edu?      Excl. in GC?    No data found.  Updated Vital Signs BP 114/80   Pulse 83   Temp 98.5 F (36.9 C) (Oral)   Resp 16   LMP 05/10/2022 (Exact Date)   SpO2 98%   Visual Acuity Right Eye Distance:   Left Eye Distance:   Bilateral Distance:    Right Eye Near:   Left Eye Near:    Bilateral Near:     Physical Exam Vitals and nursing note reviewed.  Constitutional:      Appearance: Normal appearance. She is not ill-appearing or toxic-appearing.     Comments: Very pleasant patient sitting on exam in position of comfort table in no acute distress.   HENT:     Head: Normocephalic and atraumatic.     Right Ear: Hearing, tympanic membrane, ear canal and external ear normal.     Left Ear: Hearing, tympanic membrane, ear canal and external ear normal.     Nose: Nose normal.     Mouth/Throat:     Lips: Pink.     Mouth: Mucous membranes are  moist.     Pharynx: No posterior oropharyngeal erythema.  Eyes:     General: Lids are normal. Vision grossly intact. Gaze aligned appropriately.     Extraocular Movements: Extraocular movements intact.     Conjunctiva/sclera: Conjunctivae normal.  Cardiovascular:     Rate and Rhythm: Normal rate and regular rhythm.     Heart sounds: Normal heart sounds, S1 normal and S2 normal.  Pulmonary:     Effort: Pulmonary effort is normal. No respiratory distress.     Breath sounds: Normal breath sounds and air entry.  Abdominal:     General: Bowel sounds are normal.     Palpations: Abdomen is soft.     Tenderness: There is no right CVA tenderness, left CVA tenderness or guarding.  Musculoskeletal:     Cervical back: Neck supple.  Lymphadenopathy:     Cervical: No cervical adenopathy.  Skin:    General: Skin is warm and dry.     Capillary Refill: Capillary refill takes less than 2 seconds.     Findings: No rash.  Neurological:     General: No focal deficit present.     Mental Status: She is alert and oriented to person, place, and time. Mental status is at baseline.     Cranial Nerves: No dysarthria or facial asymmetry.     Motor: No weakness.     Gait: Gait is intact. Gait normal.  Psychiatric:        Mood and Affect: Mood normal.        Speech: Speech normal.        Behavior: Behavior normal.        Thought Content: Thought content normal.  Judgment: Judgment normal.      UC Treatments / Results  Labs (all labs ordered are listed, but only abnormal results are displayed) Labs Reviewed  SARS CORONAVIRUS 2 (TAT 6-24 HRS) - Abnormal; Notable for the following components:      Result Value   SARS Coronavirus 2 POSITIVE (*)    All other components within normal limits    EKG   Radiology No results found.  Procedures Procedures (including critical care time)  Medications Ordered in UC Medications  ibuprofen (ADVIL) tablet 800 mg (800 mg Oral Given 05/31/22 1026)     Initial Impression / Assessment and Plan / UC Course  I have reviewed the triage vital signs and the nursing notes.  Pertinent labs & imaging results that were available during my care of the patient were reviewed by me and considered in my medical decision making (see chart for details).   1. COVID-19 and acute cough Positive COVID-19 home test. We will manage this outpatient with supportive care prescriptions to manage symptoms. Defered imaging based on stable cardiopulmonary exam and hemodynamically stable vital signs at time of encounter. Patient is on day 4 of symptoms and must quarantine until tomorrow, then wear a mask for the next 5 days in public and at work. Work note given. She may use zofran 4mg  every 8 hours as needed for nausea/vomiting. Bland diet recommended (BRAT diet) over the next 12-24 hours to allow stomach to rest/recover. Ibuprofen 800mg  given in clinic and may be used every 8 hours as needed for body aches, fever, and chills. Tessalon pearles may be used every 8 hours as needed for cough. Advised to increase water intake to at least 8 cups of water per day to stay well hydrated. Strict ED/UC return precautions given.   Discussed physical exam and available lab work findings in clinic with patient.  Counseled patient regarding appropriate use of medications and potential side effects for all medications recommended or prescribed today. Discussed red flag signs and symptoms of worsening condition,when to call the PCP office, return to urgent care, and when to seek higher level of care in the emergency department. Patient verbalizes understanding and agreement with plan. All questions answered. Patient discharged in stable condition.  Final Clinical Impressions(s) / UC Diagnoses   Final diagnoses:  Encounter for laboratory testing for COVID-19 virus  COVID-19  Acute cough     Discharge Instructions      You have COVID-19. Quarantine until tomorrow.  After tomorrow,  please wear a mask whenever you are in public until day 10 of your symptoms which will be on June 05, 2022.  I have provided a work note for you to return to work on Sunday, June 03, 2022.   May take ibuprofen 800 mg every 8 hours as needed for body aches, chills, and fever at home.  Take this with food to avoid stomach upset.  Next dose of ibuprofen may be tonight at 630 since you were given some in the clinic today.  Use Tessalon Perles every 8 hours as needed for your cough.  You may use Zofran 4 mg every 8 hours as needed for nausea and vomiting.  Increase your fluid intake to at least 8 cups of water per day to stay well-hydrated.  Eat a bland diet over the next 12 to 24 hours such as white bread, toast, rice, applesauce, bananas, and other light foods to avoid upsetting her stomach.   If you develop worsening cough, shortness of breath, chest  pain, or difficulty breathing, please return to urgent care or go to the emergency room if your symptoms are severe.  I hope you feel better!     ED Prescriptions     Medication Sig Dispense Auth. Provider   benzonatate (TESSALON) 100 MG capsule Take 1 capsule (100 mg total) by mouth every 8 (eight) hours. 21 capsule Reita May M, FNP   ibuprofen (ADVIL) 800 MG tablet Take 1 tablet (800 mg total) by mouth 3 (three) times daily. 21 tablet Reita May M, FNP   ondansetron (ZOFRAN-ODT) 4 MG disintegrating tablet Take 1 tablet (4 mg total) by mouth every 8 (eight) hours as needed for nausea or vomiting. 20 tablet Carlisle Beers, FNP      PDMP not reviewed this encounter.   Carlisle Beers, [place] 06/03/22 1557

## 2023-05-01 ENCOUNTER — Ambulatory Visit (INDEPENDENT_AMBULATORY_CARE_PROVIDER_SITE_OTHER): Payer: MEDICAID | Admitting: Clinical

## 2023-05-01 ENCOUNTER — Encounter (HOSPITAL_COMMUNITY): Payer: Self-pay

## 2023-05-01 DIAGNOSIS — F250 Schizoaffective disorder, bipolar type: Secondary | ICD-10-CM | POA: Diagnosis not present
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
## 2023-05-08 ENCOUNTER — Ambulatory Visit (INDEPENDENT_AMBULATORY_CARE_PROVIDER_SITE_OTHER): Payer: MEDICAID | Admitting: Psychiatry

## 2023-05-08 VITALS — HR 98 | Ht 64.0 in | Wt 141.0 lb

## 2023-05-08 DIAGNOSIS — F1994 Other psychoactive substance use, unspecified with psychoactive substance-induced mood disorder: Secondary | ICD-10-CM

## 2023-05-08 DIAGNOSIS — F1020 Alcohol dependence, uncomplicated: Secondary | ICD-10-CM

## 2023-05-08 DIAGNOSIS — F410 Panic disorder [episodic paroxysmal anxiety] without agoraphobia: Secondary | ICD-10-CM | POA: Diagnosis not present

## 2023-05-08 DIAGNOSIS — F411 Generalized anxiety disorder: Secondary | ICD-10-CM

## 2023-05-08 DIAGNOSIS — F4310 Post-traumatic stress disorder, unspecified: Secondary | ICD-10-CM

## 2023-05-08 DIAGNOSIS — F3181 Bipolar II disorder: Secondary | ICD-10-CM

## 2023-05-08 DIAGNOSIS — F17200 Nicotine dependence, unspecified, uncomplicated: Secondary | ICD-10-CM

## 2023-05-08 DIAGNOSIS — G4709 Other insomnia: Secondary | ICD-10-CM | POA: Insufficient documentation

## 2023-05-08 MED ORDER — LURASIDONE HCL 20 MG PO TABS: 20.0000 mg | ORAL_TABLET | Freq: Every day | ORAL | 2 refills | Status: DC

## 2023-05-08 MED ORDER — THIAMINE HCL 100 MG PO TABS: 100.0000 mg | ORAL_TABLET | Freq: Every day | ORAL | 2 refills | Status: DC

## 2023-05-08 NOTE — Patient Instructions (Signed)
Thank you for attending your appointment today.  -- start taking lurasidone 20 mg daily -- start taking thiamine 100 mg daily  Please do not make any changes to medications without first discussing with your provider. If you are experiencing a psychiatric emergency, please call 911 or present to your nearest emergency department. Additional crisis, medication management, and therapy resources are included below.  [facility]  [address] [place], [place] [address] [phone] WALK-IN URGENT CARE 24/7 FOR ANYONE [address] [place], [place]  [phone] Fax: [phone] [email or web address] *Interpreters available *Accepts all insurance and uninsured for Urgent Care needs *Accepts Medicaid and uninsured for outpatient treatment (below)      ONLY FOR [facility]  Below:    Outpatient New Patient Assessment/Therapy Walk-ins:        Monday -Thursday 8am until slots are full.        Every Friday 1pm-4pm  (first come, first served)                   New Patient Psychiatry/Medication Management        Monday-Friday 8am-11am (first come, first served)               For all walk-ins we ask that you arrive by 7:15am, because patients will be seen in the order of arrival.

## 2023-05-09 ENCOUNTER — Encounter (HOSPITAL_COMMUNITY): Payer: Self-pay | Admitting: Psychiatry

## 2023-05-13 ENCOUNTER — Other Ambulatory Visit (HOSPITAL_COMMUNITY): Payer: No Payment, Other

## 2023-05-15 ENCOUNTER — Telehealth (HOSPITAL_COMMUNITY): Payer: Self-pay | Admitting: *Deleted

## 2023-05-15 NOTE — Telephone Encounter (Signed)
Patient called asking if the MD would be able to help her with clarification of her labs. States she got her results and needs someone to discuss what they mean. Message sent to MD for review.

## 2023-05-16 ENCOUNTER — Telehealth (HOSPITAL_COMMUNITY): Payer: Self-pay | Admitting: Psychiatry

## 2023-05-22 NOTE — Telephone Encounter (Signed)
Telephone Encounter Note Called patient to check on progress regarding alcohol use. She says she has cut back from drinking 1/5th liquor every 2 days to drinking only 2 beers daily. I encouraged patient to continue cutting back on alcohol before we will start trazodone - she was amenable to this plan.  Patient also says she already had her blood-work drawn and would like to speak to me about the results. I told patient I will call her tomorrow to discuss as I cannot view the results.

## 2023-05-24 ENCOUNTER — Telehealth (HOSPITAL_COMMUNITY): Payer: Self-pay | Admitting: Psychiatry

## 2023-05-24 DIAGNOSIS — F1020 Alcohol dependence, uncomplicated: Secondary | ICD-10-CM

## 2023-05-24 MED ORDER — NALTREXONE HCL 50 MG PO TABS: ORAL_TABLET | ORAL | 0 refills | Status: DC

## 2023-05-24 NOTE — Telephone Encounter (Signed)
Brief Note Spoke to patient on phone to discuss lab results.  Informed patient since liver function tests were normal she can start naltrexone to help with cravings for alcohol.  I also informed patient she will need to speak with PCP to discuss elevated lipid panel.  Patient given instructions to take naltrexone 25 mg (half a pill) for 6 days and then 50 mg (1 whole pill) daily thereafter. Patient wants prescription [facility].  Signed: Bary Leriche, MD [facility] Physician 05/24/2023 3:23 PM

## 2023-06-03 ENCOUNTER — Telehealth (HOSPITAL_COMMUNITY): Payer: Self-pay | Admitting: Psychiatry

## 2023-06-03 MED ORDER — TRAZODONE HCL 50 MG PO TABS: ORAL_TABLET | ORAL | 0 refills | Status: DC

## 2023-06-03 NOTE — Telephone Encounter (Signed)
Brief Note: Patient says she has not had alcohol since Thursday (05/30/2023). She is still having some sleep issues, specifically about staying asleep. She is open to trying trazodone for her insomnia.  Patient given instructions to take trazodone 25 mg (half a pill) to 50 mg (1 whole pill) as needed for sleep.   Signed: Augusto Gamble, MD 06/03/2023 1:54 PM
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
## 2023-06-26 ENCOUNTER — Encounter (HOSPITAL_COMMUNITY): Payer: Self-pay | Admitting: Psychiatry

## 2023-06-26 ENCOUNTER — Ambulatory Visit (INDEPENDENT_AMBULATORY_CARE_PROVIDER_SITE_OTHER): Payer: MEDICAID | Admitting: Psychiatry

## 2023-06-26 VITALS — HR 94 | Ht 64.0 in | Wt 148.0 lb

## 2023-06-26 DIAGNOSIS — F410 Panic disorder [episodic paroxysmal anxiety] without agoraphobia: Secondary | ICD-10-CM | POA: Diagnosis not present

## 2023-06-26 DIAGNOSIS — G4709 Other insomnia: Secondary | ICD-10-CM

## 2023-06-26 DIAGNOSIS — F411 Generalized anxiety disorder: Secondary | ICD-10-CM | POA: Diagnosis not present

## 2023-06-26 DIAGNOSIS — F1090 Alcohol use, unspecified, uncomplicated: Secondary | ICD-10-CM

## 2023-06-26 DIAGNOSIS — F4310 Post-traumatic stress disorder, unspecified: Secondary | ICD-10-CM | POA: Diagnosis not present

## 2023-06-26 DIAGNOSIS — F4010 Social phobia, unspecified: Secondary | ICD-10-CM | POA: Insufficient documentation

## 2023-06-26 MED ORDER — SERTRALINE HCL 50 MG PO TABS: ORAL_TABLET | ORAL | 0 refills | Status: DC

## 2023-06-26 NOTE — Patient Instructions (Addendum)
Thank you for attending your appointment today.  -- start taking sertraline (Zoloft) 25 mg (half a pill) daily for 14 days, then take 50 mg (whole pill) daily afterwards -- continue taking naltrexone as currently taking -- continue taking lurasidone as previously directed -- continue taking trazodone as previously directed  Please do not make any changes to medications without first discussing with your provider. If you are experiencing a psychiatric emergency, please call 911 or present to your nearest emergency department. Additional crisis, medication management, and therapy resources are included below.  [facility]  [address] [place], [place] [address] [phone] WALK-IN URGENT CARE 24/7 FOR ANYONE [address], [place], [place]  [phone] Fax: [phone] [email or web address] *Interpreters available *Accepts all insurance and uninsured for Urgent Care needs *Accepts Medicaid and uninsured for outpatient treatment (below)      ONLY FOR [facility]  Below:    Outpatient New Patient Assessment/Therapy Walk-ins:        Monday -Thursday 8am until slots are full.        Every Friday 1pm-4pm  (first come, first served)                   New Patient Psychiatry/Medication Management        Monday-Friday 8am-11am (first come, first served)               For all walk-ins we ask that you arrive by 7:15am, because patients will be seen in the order of arrival.

## 2023-07-10 ENCOUNTER — Telehealth (HOSPITAL_COMMUNITY): Payer: Self-pay | Admitting: *Deleted

## 2023-07-10 NOTE — Telephone Encounter (Signed)
Patient called stating that her pharmacy has not yet received her refill for Trazodone. Asking that it be sent. Next appointment 12/18 with MD.

## 2023-07-11 ENCOUNTER — Other Ambulatory Visit: Payer: Self-pay | Admitting: Psychiatry

## 2023-07-11 DIAGNOSIS — F250 Schizoaffective disorder, bipolar type: Secondary | ICD-10-CM

## 2023-07-12 ENCOUNTER — Other Ambulatory Visit (HOSPITAL_COMMUNITY): Payer: Self-pay | Admitting: Psychiatry

## 2023-07-12 MED ORDER — TRAZODONE HCL 50 MG PO TABS: 50.0000 mg | ORAL_TABLET | Freq: Every day | ORAL | 1 refills | Status: DC

## 2023-07-24 ENCOUNTER — Telehealth (HOSPITAL_COMMUNITY): Payer: Self-pay | Admitting: *Deleted

## 2023-07-24 NOTE — Telephone Encounter (Signed)
Patient called states that she has broken out in a rash on her face, mostly cheeks and forehead. Skin is starting to peel, itching, burning when washing her face. No swelling or difficulty breathing. Patient states that her Doctor started Jordan recently and asked her to call if she devleoped a rash. She is stopping the medication but would like something to take in place of Jordan.

## 2023-07-28 ENCOUNTER — Telehealth: Payer: Self-pay | Admitting: Psychiatry

## 2023-07-28 NOTE — Telephone Encounter (Deleted)
-----   Message from Victoria [name] sent at 06/26/2023  5:15 PM EDT ----- Call patient to check

## 2023-07-28 NOTE — Telephone Encounter (Addendum)
Brief Psych Note (telephone conversation):  Received message from nursing staff regarding patient experiencing rash on face which she attributes to lurasidone.  Called patient 07/28/2023 to discuss reported side-effect. Per patient, she suddenly broke into a rash with her skin peeling and when using lotion her face would feel like it is burning. She has since stopped taking lurasidone and facial rash subsided with no recurrence. I informed patient to refrain from resuming lurasidone for now.  She asks for another mood stabilizer to replace lurasidone. Informed patient I will call her again after I speak to Dr. Adrian Blackwater regarding mood stabilization options.  Signed: Bary Leriche, MD [facility] Health Physician 07/28/2023 11:10 AM

## 2023-07-29 ENCOUNTER — Ambulatory Visit (INDEPENDENT_AMBULATORY_CARE_PROVIDER_SITE_OTHER): Payer: MEDICAID | Admitting: Clinical

## 2023-07-29 ENCOUNTER — Telehealth: Payer: Self-pay | Admitting: Psychiatry

## 2023-07-29 ENCOUNTER — Telehealth (HOSPITAL_COMMUNITY): Payer: Self-pay | Admitting: Psychiatry

## 2023-07-29 DIAGNOSIS — F3181 Bipolar II disorder: Secondary | ICD-10-CM

## 2023-07-29 DIAGNOSIS — F411 Generalized anxiety disorder: Secondary | ICD-10-CM

## 2023-07-29 MED ORDER — QUETIAPINE FUMARATE 100 MG PO TABS: ORAL_TABLET | ORAL | 0 refills | Status: DC

## 2023-07-29 NOTE — Progress Notes (Signed)
THERAPIST PROGRESS NOTE Virtual Visit via Video Note  I connected with Victoria [name] on 07/29/23 at  4:00 PM EDT by a video enabled telemedicine application and verified that I am speaking with the correct person using two identifiers.  Location: Patient: home Provider: office   I discussed the limitations of evaluation and management by telemedicine and the availability of in person appointments. The patient expressed understanding and agreed to proceed.   Follow Up Instructions: I discussed the assessment and treatment plan with the patient. The patient was provided an opportunity to ask questions and all were answered. The patient agreed with the plan and demonstrated an understanding of the instructions.   The patient was advised to call back or seek an in-person evaluation if the symptoms worsen or if the condition fails to improve as anticipated.   Session Time: 30 minutes  Participation Level: Active  Behavioral Response: CasualAlertEuthymic  Type of Therapy: Individual Therapy  Treatment Goals addressed: Client will practice behavioral activation skills 3 times per week for the next 12 weeks  ProgressTowards Goals: Progressing  Interventions: CBT and Supportive  Summary:  Victoria [name] is a 32 y.o. female who presents for the scheduled appointment oriented x 5, appropriately dressed, and friendly.  Client denied hallucinations and delusions. Client reported on today she is feeling better being when she originally completed her intake assessment.  Client reported since meeting with the psychiatrist they discussed her heavy alcohol use and talk to her about working to decrease. Client reported she is not using excessively as she did before and she was not aware that it was as much of a problem as it was using it as a coping skill.  Client reported she is now on a medication to help her abstain from usage completely eventually. Client reported overall the  medication is working well.  Client reported which she would like to work on now is her self-esteem.  Client reported her self-esteem that she struggles with comes from a mental aspect.  Client reported she has a hard time interacting with other people as she can feel intimidated around others.  Client reported by history her father told her negative things about herself that she is stupid. Client reported it also has been difficult for her to keep a job as by history anxiety has interfered.  Client reported if she is overwhelmed and overstimulated by things going on at home as well as at work she would leave a job.  Client reported she wants to in that cycle of not keeping a job for longer than 6 months.  Client reported when it comes to communication with people she knew wants to learn how to respond in situations where it may be conflictual. Evidence of progress towards goal: Client reported 1 cognitive pattern of feeling overwhelmed as it relates to anxiety which has negatively interfered with her ability to maintain employment.  Suicidal/Homicidal: Nowithout intent/plan  Therapist Response:  Therapist began the appointment asking the client how she has been doing since last seen. Therapist used CBT to engage using active listening and positive emotional support. Therapist used CBT to ask the client about her response to her medication regimen and reinforce compliance. Therapist used CBT to ask the client about changes she is making as far as reducing alcohol usage and reinforced continued cessation of frequent use. Therapist used CBT to have the client identify goals that she would like to continue working on in her sessions. Therapist used CBT to teach  the client about precoached phrases that help her to appropriately communicate and exit triggering situations as well as self-care techniques that she can utilize while at work on her breaks. Therapist used CBT ask the client to identify her progress  with frequency of use with coping skills with continued practice in her daily activity.    Therapist assigned the client homework to work on some of the skills discussed.   Plan: Return again in 4 weeks.  Diagnosis: Generalized anxiety disorder  Collaboration of Care: Other client inquired if the therapist can reach out to her psychiatrist about getting in touch with her about her medications.  Patient/Guardian was advised Release of Information must be obtained prior to any record release in order to collaborate their care with an outside provider. Patient/Guardian was advised if they have not already done so to contact the registration department to sign all necessary forms in order for [place] to release information regarding their care.   Consent: Patient/Guardian gives verbal consent for treatment and assignment of benefits for services provided during this visit. Patient/Guardian expressed understanding and agreed to proceed.   Neena Rhymes Chala Gul, LCSW 07/29/2023

## 2023-07-29 NOTE — Telephone Encounter (Signed)
Brief Psych Note: Patient reiterates she did not start any new medications. She also denies using any new topical agents or soaps.  I counseled patient on the need to be on a mood stabilizer given the need to discontinue lurasidone after developing a facial rash.  I asked if she would be willing to re-trial nighttime dosing of quetiapine given she has tried it in the past with only complaint of drowsiness. She was amenable to this plan.  Counseled patient on risks / benefits / side-effects of second generation antipsychotics.  Instructions given to take quetiapine with gradual titration up to 200 mg at bedtime daily.  Signed: Bary Leriche, MD [facility] Health Physician 07/29/2023 1:09 PM

## 2023-08-14 ENCOUNTER — Telehealth (HOSPITAL_COMMUNITY): Payer: Self-pay | Admitting: *Deleted

## 2023-08-14 DIAGNOSIS — F1020 Alcohol dependence, uncomplicated: Secondary | ICD-10-CM

## 2023-08-14 NOTE — Telephone Encounter (Signed)
Patient called asking for refills of Trazodone and Naltrexone. Chart reviewed, next appointment 12/18. Trazodone has been refilled by a different provider on 07/12/23 with 1 refills.

## 2023-08-16 MED ORDER — TRAZODONE HCL 50 MG PO TABS
50.0000 mg | ORAL_TABLET | Freq: Every day | ORAL | 1 refills | Status: DC
Start: 2023-08-16 — End: 2023-11-26

## 2023-08-16 MED ORDER — NALTREXONE HCL 50 MG PO TABS: 50.0000 mg | ORAL_TABLET | Freq: Every day | ORAL | 1 refills | Status: AC

## 2023-08-22 ENCOUNTER — Ambulatory Visit (HOSPITAL_COMMUNITY): Payer: No Payment, Other | Admitting: Clinical

## 2023-08-22 ENCOUNTER — Encounter (HOSPITAL_COMMUNITY): Payer: Self-pay

## 2023-09-05 ENCOUNTER — Ambulatory Visit (INDEPENDENT_AMBULATORY_CARE_PROVIDER_SITE_OTHER): Payer: Self-pay | Admitting: Clinical

## 2023-09-05 DIAGNOSIS — F411 Generalized anxiety disorder: Secondary | ICD-10-CM

## 2023-09-08 NOTE — Progress Notes (Signed)
No show for therapy

## 2023-10-07 ENCOUNTER — Encounter (HOSPITAL_COMMUNITY): Payer: Self-pay

## 2023-10-09 ENCOUNTER — Encounter (HOSPITAL_COMMUNITY): Payer: No Payment, Other | Admitting: Psychiatry

## 2023-10-23 NOTE — L&D Delivery Note (Signed)
 OB/GYN Faculty Practice Delivery Note  Victoria [name] is a 33 y.o. G1P1001 s/p SVD at [redacted]w[redacted]d. She was admitted for IOL due to decreased fetal movement.   ROM: 16h 48m with light to moderate meconium stained fluid GBS Status: Negative/-- (08/12 1204) Maximum Maternal Temperature: 98.42F   Labor Progress: Initial SVE: 0.5/thick/-3. She then progressed to complete.   Delivery Date/Time: 06/22/24 1730 Delivery: Called to room and patient was complete and pushing. The head delivered LOA. The fetal shoulder was not delivering with typical traction required for delivery of anterior shoulder. No excessive traction was used to facilitate delivery of infant. A shoulder dystocia was called -- called to have extra nursing team and NICU team in room for delivery, and make OR staff aware. The head of the bed was laid flat. Nursing team applied suprapubic pressure. Still unable to deliver infant so posterior axillary sling attempted with successful delivery of posterior arm with subsequent delivery of anterior shoulder and remainder of body over next two pushes. Infant limp and floppy, so cord clamped and cut immediately and infant handed off to the warmer. Notably had cord wrapped around foot twice, reduced at perineum.  Total time of shoulder dystocia: 50 seconds.  Arterial cord gas obtained and was 7.16, pCO2 58, bicarb 20.7, A-B deficit 8.6. Cord blood drawn. Initial APGAR 3.  Placenta delivered spontaneously with gentle cord traction. Fundus firm with massage and Pitocin . labia, perineum, vagina, and cervix inspected inspected with a small hemostatic first degree perineal laceration noted which was hemostatic and did not require repair.  Baby Weight: pending  Placenta: Sent to L&D Complications: Shoulder dystocia Lacerations: First degree - not repaired EBL: 100 mL Analgesia: Epidural   Infant:  APGAR (1 MIN): 3  APGAR (5 MINS): 6 APGAR (10 MINS): 8   Alain Sor, MD OB Fellow,  Faculty Practice [facility], Center for [facility]

## 2023-10-25 ENCOUNTER — Emergency Department (HOSPITAL_COMMUNITY): Payer: MEDICAID

## 2023-10-25 ENCOUNTER — Emergency Department (HOSPITAL_COMMUNITY)
Admission: EM | Admit: 2023-10-25 | Discharge: 2023-10-26 | Disposition: A | Discharge status: Home / Self Care | Payer: MEDICAID | Attending: Emergency Medicine | Admitting: Emergency Medicine

## 2023-10-25 ENCOUNTER — Encounter (HOSPITAL_COMMUNITY): Payer: Self-pay | Admitting: Emergency Medicine

## 2023-10-25 DIAGNOSIS — O0991 Supervision of high risk pregnancy, unspecified, first trimester: Secondary | ICD-10-CM | POA: Diagnosis not present

## 2023-10-25 DIAGNOSIS — R102 Pelvic and perineal pain: Secondary | ICD-10-CM | POA: Insufficient documentation

## 2023-10-25 DIAGNOSIS — O0001 Abdominal pregnancy with intrauterine pregnancy: Secondary | ICD-10-CM | POA: Insufficient documentation

## 2023-10-25 DIAGNOSIS — Z3490 Encounter for supervision of normal pregnancy, unspecified, unspecified trimester: Secondary | ICD-10-CM

## 2023-10-25 DIAGNOSIS — Z3A01 Less than 8 weeks gestation of pregnancy: Secondary | ICD-10-CM | POA: Diagnosis not present

## 2023-10-25 DIAGNOSIS — R350 Frequency of micturition: Secondary | ICD-10-CM | POA: Insufficient documentation

## 2023-10-25 LAB — CBC WITH DIFFERENTIAL/PLATELET
Abs Immature Granulocytes: 0.03 10*3/uL (ref 0.00–0.07)
Basophils Absolute: 0 10*3/uL (ref 0.0–0.1)
Basophils Relative: 0 %
Eosinophils Absolute: 0.1 10*3/uL (ref 0.0–0.5)
Eosinophils Relative: 1 %
HCT: 40.3 % (ref 36.0–46.0)
Hemoglobin: 12.5 g/dL (ref 12.0–15.0)
Immature Granulocytes: 0 %
Lymphocytes Relative: 33 %
Lymphs Abs: 3.1 10*3/uL (ref 0.7–4.0)
MCH: 26.9 pg (ref 26.0–34.0)
MCHC: 31 g/dL (ref 30.0–36.0)
MCV: 86.7 fL (ref 80.0–100.0)
Monocytes Absolute: 0.7 10*3/uL (ref 0.1–1.0)
Monocytes Relative: 7 %
Neutro Abs: 5.5 10*3/uL (ref 1.7–7.7)
Neutrophils Relative %: 59 %
Platelets: 333 10*3/uL (ref 150–400)
RBC: 4.65 MIL/uL (ref 3.87–5.11)
RDW: 13.2 % (ref 11.5–15.5)
WBC: 9.5 10*3/uL (ref 4.0–10.5)
nRBC: 0 % (ref 0.0–0.2)

## 2023-10-25 LAB — COMPREHENSIVE METABOLIC PANEL
ALT: 29 U/L (ref 0–44)
AST: 20 U/L (ref 15–41)
Albumin: 4.4 g/dL (ref 3.5–5.0)
Alkaline Phosphatase: 58 U/L (ref 38–126)
Anion gap: 8 (ref 5–15)
BUN: 11 mg/dL (ref 6–20)
CO2: 22 mmol/L (ref 22–32)
Calcium: 9.3 mg/dL (ref 8.9–10.3)
Chloride: 104 mmol/L (ref 98–111)
Creatinine, Ser: 0.59 mg/dL (ref 0.44–1.00)
GFR, Estimated: >60 mL/min (ref >60)
Glucose, Bld: 91 mg/dL (ref 70–99)
Potassium: 3.1 mmol/L — ABNORMAL LOW (ref 3.5–5.1)
Sodium: 134 mmol/L — ABNORMAL LOW (ref 135–145)
Total Bilirubin: 0.6 mg/dL (ref 0.0–1.2)
Total Protein: 8 g/dL (ref 6.5–8.1)

## 2023-10-25 LAB — POC URINE PREG, ED: Preg Test, Ur: POSITIVE — AB

## 2023-10-25 LAB — HCG, QUANTITATIVE, PREGNANCY: hCG, Beta Chain, Quant, S: 501 m[IU]/mL — ABNORMAL HIGH (ref <5)

## 2023-10-25 NOTE — ED Triage Notes (Signed)
 Pt 's LMP was November 17th. Took pregnancy test and is having pelvic cramping. Denies vaginal bleeding or discharge. Pain is lower in nature and she is having urinary frequency.

## 2023-10-26 LAB — WET PREP, GENITAL
Clue Cells Wet Prep HPF POC: NONE SEEN
Sperm: NONE SEEN
Trich, Wet Prep: NONE SEEN
WBC, Wet Prep HPF POC: <10 (ref <10)
Yeast Wet Prep HPF POC: NONE SEEN

## 2023-10-26 NOTE — ED Provider Notes (Signed)
 [place] EMERGENCY DEPARTMENT AT [facility] Provider Note   CSN: [id number] Arrival date & time: 10/25/23  1954     History  Chief Complaint  Patient presents with   Pelvic Pain    Victoria [name] is a 33 y.o. female. Patient with past medical history significant for PTSD, anxiety disorder, alcohol use disorder presents to the emergency department complaining of pelvic tightness which been happening for a few days.  She does endorse mild urinary frequency.  Patient states she had a positive home pregnancy test.  She denies vaginal bleeding, vaginal discharge, other abdominal pain, chest pain, shortness of breath, fevers.   Pelvic Pain       Home Medications Prior to Admission medications   Medication Sig Start Date End Date Taking? Authorizing Provider  QUEtiapine  (SEROQUEL ) 100 MG tablet Take 0.5 tablets (50 mg total) by mouth at bedtime for 4 days, THEN 1 tablet (100 mg total) at bedtime for 3 days, THEN 2 tablets (200 mg total) at bedtime. 07/29/23 10/12/23  Waymond Sieving, MD  sertraline  (ZOLOFT ) 50 MG tablet Take 0.5 tablets (25 mg total) by mouth daily for 14 days, THEN 1 tablet (50 mg total) daily. 06/26/23 10/18/23  Waymond Sieving, MD  thiamine  (VITAMIN B1) 100 MG tablet Take 1 tablet (100 mg total) by mouth daily. Patient not taking: Reported on 06/26/2023 05/08/23   Waymond Sieving, MD  traZODone  (DESYREL ) 50 MG tablet Take 1 tablet (50 mg total) by mouth at bedtime. 08/16/23 10/15/23  Waymond Sieving, MD  citalopram  (CELEXA ) 40 MG tablet Take 1 tablet (40 mg total) by mouth daily. Patient not taking: Reported on 05/06/2019 07/18/18 06/05/19  Loetta Senior, MD  lamoTRIgine  (LAMICTAL ) 25 MG tablet Take 1 tablet (25 mg total) by mouth at bedtime. Patient not taking: Reported on 05/06/2019 07/18/18 06/05/19  Loetta Senior, MD  prazosin  (MINIPRESS ) 1 MG capsule Take 1 capsule (1 mg total) by mouth 2 (two) times daily. Patient not taking: No sig reported 07/18/18 06/05/19  Loetta Senior, MD  promethazine  (PHENERGAN ) 12.5 MG tablet Take 12.5 mg by mouth daily as needed for nausea/vomiting. 07/15/18 06/05/19  [provider]  risperiDONE  (RISPERDAL ) 2 MG tablet Take 1 tablet (2 mg total) by mouth at bedtime. Patient not taking: Reported on 05/06/2019 07/18/18 06/05/19  Loetta Senior, MD  rizatriptan (MAXALT-MLT) 5 MG disintegrating tablet Take 5 mg by mouth daily as needed for migraine. 07/15/18 06/05/19  [provider]  venlafaxine XR (EFFEXOR-XR) 37.5 MG 24 hr capsule Take 1 capsule by mouth daily. 07/15/18 06/05/19  [provider]      Allergies    Lurasidone  hcl    Review of Systems   Review of Systems  Genitourinary:  Positive for pelvic pain.    Physical Exam Updated Vital Signs BP 129/80 (BP Location: Left Arm)   Pulse 98   Temp 97.9 F (36.6 C) (Oral)   Resp 17   Ht 5' 4 (1.626 m)   Wt 68 kg   LMP 08/29/2023   SpO2 99%   BMI 25.75 kg/m  Physical Exam Vitals and nursing note reviewed.  HENT:     Head: Normocephalic and atraumatic.  Cardiovascular:     Rate and Rhythm: Normal rate and regular rhythm.  Pulmonary:     Effort: Pulmonary effort is normal. No respiratory distress.     Breath sounds: Normal breath sounds.  Abdominal:     Palpations: Abdomen is soft.     Tenderness: There is no abdominal  tenderness.  Musculoskeletal:        General: No signs of injury.     Cervical back: Normal range of motion.  Skin:    General: Skin is dry.  Neurological:     Mental Status: She is alert.  Psychiatric:        Speech: Speech normal.        Behavior: Behavior normal.     ED Results / Procedures / Treatments   Labs (all labs ordered are listed, but only abnormal results are displayed) Labs Reviewed  HCG, QUANTITATIVE, PREGNANCY - Abnormal; Notable for the following components:      Result Value   hCG, Beta Chain, Quant, S 501 (*)    All other components within normal limits  COMPREHENSIVE METABOLIC PANEL -  Abnormal; Notable for the following components:   Sodium 134 (*)    Potassium 3.1 (*)    All other components within normal limits  POC URINE PREG, ED - Abnormal; Notable for the following components:   Preg Test, Ur POSITIVE (*)    All other components within normal limits  WET PREP, GENITAL  CBC WITH DIFFERENTIAL/PLATELET  URINALYSIS, ROUTINE W REFLEX MICROSCOPIC  GC/CHLAMYDIA PROBE AMP ([place]) NOT AT [facility]    EKG None  Radiology US  OB Comp Less 14 Wks Result Date: 10/25/2023 CLINICAL DATA:  Abdominal pain. EXAM: OBSTETRIC <14 WK US  AND TRANSVAGINAL OB US  TECHNIQUE: Both transabdominal and transvaginal ultrasound examinations were performed for complete evaluation of the gestation as well as the maternal uterus, adnexal regions, and pelvic cul-de-sac. Transvaginal technique was performed to assess early pregnancy. COMPARISON:  None Available. FINDINGS: Intrauterine gestational sac: Single Yolk sac:  Not Visualized. Embryo:  Not Visualized. Cardiac Activity: Not Visualized. MSD: 0.28 cm   5 w   0 d Subchorionic hemorrhage:  None visualized. Maternal uterus/adnexae: Bilateral ovaries are visualized and appear within normal limits. There is no pelvic free fluid. IMPRESSION: 1. Questionable single intrauterine gestational sac corresponding to gestational age of [redacted] weeks and 0 days. No yolk sac or embryo identified at this time to confirm which is within normal limits for a sac of this size. Recommend correlation with beta hCG and short-term follow-up ultrasound to confirm viability. Electronically Signed   By: Greig Pique M.D.   On: 10/25/2023 23:34    Procedures Procedures    Medications Ordered in ED Medications - No data to display  ED Course/ Medical Decision Making/ [name]                                 Medical Decision Making Amount and/or Complexity of Data Reviewed Labs: ordered.   This patient presents to the ED for concern of pelvic discomfort, this involves an  extensive number of treatment options, and is a complaint that carries with it a high risk of complications and morbidity.  The differential diagnosis includes failed pregnancy, ectopic pregnancy, STI, ovarian cyst, others   Co morbidities that complicate the patient evaluation  Anxiety disorder   Additional history obtained:  Additional history obtained from visitor at bedside   Lab Tests:  I Ordered, and personally interpreted labs.  The pertinent results include: Beta hCG 501, negative wet prep   Imaging Studies ordered:  I ordered imaging studies including pelvic ultrasound I independently visualized and interpreted imaging which showed  1. Questionable single intrauterine gestational sac corresponding to  gestational age of [redacted] weeks and 0 days.  No yolk sac or embryo  identified at this time to confirm which is within normal limits for  a sac of this size. Recommend correlation with beta hCG and  short-term follow-up ultrasound to confirm viability.   I agree with the radiologist interpretation   Social Determinants of Health:  Patient has no primary care provider, daily tobacco user   Test / Admission - Considered:  Patient with positive hCG, possible early intrauterine pregnancy on ultrasound.  Patient will need repeat testing including hCG and likely ultrasound in the next few weeks.  Patient will follow-up with OB/GYN.  Wet prep was negative.  Patient will follow MyChart for results of gonorrhea chlamydia testing but patient has been grossly asymptomatic and I do not feel that starting coverage at this time would be prudent.  No indication for admission.  Discharge home with return precautions         Final Clinical Impression(s) / ED Diagnoses Final diagnoses:  Intrauterine pregnancy    Rx / DC Orders ED Discharge Orders     None         Logan Ubaldo KATHEE DEVONNA 10/26/23 [id number]    Carita Senior, MD 10/26/23 [phone]

## 2023-10-26 NOTE — Discharge Instructions (Signed)
 Your ultrasound tonight showed a possible early intrauterine pregnancy.  You will need repeat hCG testing along with a likely repeat ultrasound.  Please call OB/GYN to schedule a follow-up appointment.  Your wet prep was negative.  You may follow the gonorrhea chlamydia testing on MyChart.  If positive you will need antibiotic treatment.  If you develop any life-threatening symptoms please return to the emergency room.

## 2023-10-28 LAB — GC/CHLAMYDIA PROBE AMP (~~LOC~~) NOT AT ARMC
Chlamydia: NEGATIVE
Comment: NEGATIVE
Comment: NORMAL
Neisseria Gonorrhea: NEGATIVE

## 2023-10-31 ENCOUNTER — Other Ambulatory Visit: Payer: Self-pay

## 2023-10-31 ENCOUNTER — Ambulatory Visit: Payer: MEDICAID | Admitting: General Practice

## 2023-10-31 VITALS — BP 119/75 | HR 95 | Ht 64.0 in | Wt 166.0 lb

## 2023-10-31 DIAGNOSIS — Z3A00 Weeks of gestation of pregnancy not specified: Secondary | ICD-10-CM | POA: Diagnosis not present

## 2023-10-31 DIAGNOSIS — O3680X0 Pregnancy with inconclusive fetal viability, not applicable or unspecified: Secondary | ICD-10-CM | POA: Diagnosis not present

## 2023-10-31 LAB — BETA HCG QUANT (REF LAB): hCG Quant: 3636 m[IU]/mL

## 2023-10-31 NOTE — Progress Notes (Signed)
 Beta HCG Follow-up Visit  Victoria [name] presents to [facility] for follow-up beta HCG lab. She was seen in [facility] ED for  cramping  on 1/4. Patient reports  continued mild  today similar to a period. Discussed with patient that we are following beta HCG levels today. Results will be back in approximately 2 hours. Valid contact number for patient confirmed. I will call the patient with results.   Beta HCG results:         1/3          501          1/9        3,636      Results and patient history reviewed with Dr Lola, who states bhcg levels rose appropriately, should have follow up ultrasound in 1 week. Patient called and informed of plan for follow-up. Ultrasound scheduled.  Elenor Mole 10/31/2023 12:09 PM

## 2023-11-08 ENCOUNTER — Ambulatory Visit (HOSPITAL_COMMUNITY)
Admission: RE | Admit: 2023-11-08 | Discharge: 2023-11-08 | Disposition: A | Discharge status: Home / Self Care | Payer: MEDICAID | Source: Ambulatory Visit | Attending: Family Medicine | Admitting: Family Medicine

## 2023-11-08 DIAGNOSIS — O3680X0 Pregnancy with inconclusive fetal viability, not applicable or unspecified: Secondary | ICD-10-CM | POA: Insufficient documentation

## 2023-11-11 ENCOUNTER — Encounter: Payer: Self-pay | Admitting: Family Medicine

## 2023-11-11 NOTE — Telephone Encounter (Signed)
VM left on nurse line by patient requesting ultrasound results from Friday, 11/08/23.

## 2023-11-20 ENCOUNTER — Other Ambulatory Visit: Payer: Self-pay | Admitting: Obstetrics & Gynecology

## 2023-11-20 DIAGNOSIS — O3680X0 Pregnancy with inconclusive fetal viability, not applicable or unspecified: Secondary | ICD-10-CM

## 2023-11-22 ENCOUNTER — Telehealth (HOSPITAL_COMMUNITY): Payer: Self-pay

## 2023-11-22 NOTE — Telephone Encounter (Signed)
Medication refill - Call from patient requesting refills of her prescribed Quetiapine and Trazodone. Patient last seen by Dr. Jodie Echevaria 06/26/23 and was cancelled for appt 10/09/23 after Dr. Jodie Echevaria was no longer with [facility] and not rescheduled at that time. Patient to be set up for a new appointment with another provider; however, patient did admit to being [redacted] weeks pregnant now and stated her OBGY&N stated she would be fine to stay on her medications and patient feels she needs these.  Informed patient Dr. Jodie Echevaria was no longer with [facility] so would have to send this to another attending to see if they would refill and if they would call her back to discuss.

## 2023-11-26 ENCOUNTER — Ambulatory Visit (INDEPENDENT_AMBULATORY_CARE_PROVIDER_SITE_OTHER): Payer: MEDICAID | Admitting: Psychiatry

## 2023-11-26 ENCOUNTER — Encounter (HOSPITAL_COMMUNITY): Payer: Self-pay | Admitting: Psychiatry

## 2023-11-26 ENCOUNTER — Ambulatory Visit: Payer: MEDICAID | Admitting: Radiology

## 2023-11-26 ENCOUNTER — Encounter: Payer: Self-pay | Admitting: Radiology

## 2023-11-26 DIAGNOSIS — F411 Generalized anxiety disorder: Secondary | ICD-10-CM

## 2023-11-26 DIAGNOSIS — F3181 Bipolar II disorder: Secondary | ICD-10-CM

## 2023-11-26 DIAGNOSIS — O3680X0 Pregnancy with inconclusive fetal viability, not applicable or unspecified: Secondary | ICD-10-CM

## 2023-11-26 DIAGNOSIS — Z3491 Encounter for supervision of normal pregnancy, unspecified, first trimester: Secondary | ICD-10-CM | POA: Diagnosis not present

## 2023-11-26 DIAGNOSIS — F4310 Post-traumatic stress disorder, unspecified: Secondary | ICD-10-CM | POA: Diagnosis not present

## 2023-11-26 DIAGNOSIS — Z3A11 11 weeks gestation of pregnancy: Secondary | ICD-10-CM

## 2023-11-26 MED ORDER — QUETIAPINE FUMARATE 25 MG PO TABS: 25.0000 mg | ORAL_TABLET | Freq: Every day | ORAL | 3 refills | Status: DC

## 2023-11-26 MED ORDER — TRAZODONE HCL 50 MG PO TABS: 50.0000 mg | ORAL_TABLET | Freq: Every day | ORAL | 3 refills | Status: DC

## 2023-11-26 MED ORDER — SERTRALINE HCL 50 MG PO TABS: 50.0000 mg | ORAL_TABLET | Freq: Every day | ORAL | 3 refills | Status: DC
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
## 2023-11-26 NOTE — Progress Notes (Signed)
 US :  [place] by LMP = 11+2  Anteverted uterus with viable active early IUP - GS intact CRL = 23.06 mm = [redacted]w[redacted]d  FHR = 168 bpm  size < dates  normal yolk sac = 5.55 mm Normal ov's - neg adnexal regions - neg CDS - no free fluid present EDD by todays u/s = 06-30-24

## 2023-11-27 ENCOUNTER — Telehealth: Payer: Self-pay | Admitting: *Deleted

## 2023-11-27 ENCOUNTER — Telehealth (HOSPITAL_COMMUNITY): Payer: Self-pay | Admitting: *Deleted

## 2023-11-27 NOTE — Telephone Encounter (Signed)
 Fax received for PA of Seroquel . Submitted online with cover my meds. Awaiting decision.

## 2023-11-27 NOTE — Telephone Encounter (Signed)
 Patient called with c/o itching all over her body that started last Saturday.  States she restarting taking a medication that she was previously prescribed last year and itching started after that.  States she has since stopped the medication but is still having itching.  Advised to call prescriber who wrote for her to take it and let them know what is going on.  Advised she can take Benadryl  if needed.  Verbalized understanding.

## 2023-11-28 ENCOUNTER — Telehealth (HOSPITAL_COMMUNITY): Payer: Self-pay | Admitting: *Deleted

## 2023-11-28 NOTE — Telephone Encounter (Signed)
 Fax received from [facility] stating that Quetiapine  25mg  has been approved until 11/26/24. Notified pharmacy.

## 2023-12-05 ENCOUNTER — Ambulatory Visit (INDEPENDENT_AMBULATORY_CARE_PROVIDER_SITE_OTHER): Payer: MEDICAID

## 2023-12-05 ENCOUNTER — Encounter (HOSPITAL_COMMUNITY): Payer: Self-pay

## 2023-12-05 DIAGNOSIS — F3181 Bipolar II disorder: Secondary | ICD-10-CM

## 2023-12-05 DIAGNOSIS — F4310 Post-traumatic stress disorder, unspecified: Secondary | ICD-10-CM

## 2023-12-05 DIAGNOSIS — F4010 Social phobia, unspecified: Secondary | ICD-10-CM

## 2023-12-05 DIAGNOSIS — F1020 Alcohol dependence, uncomplicated: Secondary | ICD-10-CM | POA: Diagnosis not present

## 2023-12-05 DIAGNOSIS — F1221 Cannabis dependence, in remission: Secondary | ICD-10-CM
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
## 2023-12-12 ENCOUNTER — Ambulatory Visit (HOSPITAL_COMMUNITY): Payer: MEDICAID

## 2023-12-12 ENCOUNTER — Encounter (HOSPITAL_COMMUNITY): Payer: Self-pay

## 2023-12-12 NOTE — Progress Notes (Signed)
"  Victoria [name]" was a no show today after being contacted this morning asking if she wanted to have a virtual session.  Remigio Eisenmenger, MS, LMFT, LCAS 12-12-23

## 2023-12-19 ENCOUNTER — Other Ambulatory Visit (HOSPITAL_COMMUNITY): Payer: Self-pay | Admitting: Psychiatry

## 2023-12-19 DIAGNOSIS — F3181 Bipolar II disorder: Secondary | ICD-10-CM

## 2023-12-23 ENCOUNTER — Other Ambulatory Visit: Payer: Self-pay | Admitting: Obstetrics & Gynecology

## 2023-12-23 ENCOUNTER — Encounter: Payer: Self-pay | Admitting: Women's Health

## 2023-12-23 DIAGNOSIS — Z3682 Encounter for antenatal screening for nuchal translucency: Secondary | ICD-10-CM

## 2023-12-24 ENCOUNTER — Encounter: Payer: Self-pay | Admitting: Women's Health

## 2023-12-24 ENCOUNTER — Ambulatory Visit (INDEPENDENT_AMBULATORY_CARE_PROVIDER_SITE_OTHER): Payer: MEDICAID | Admitting: Women's Health

## 2023-12-24 ENCOUNTER — Telehealth (HOSPITAL_COMMUNITY): Payer: Self-pay | Admitting: *Deleted

## 2023-12-24 ENCOUNTER — Other Ambulatory Visit (HOSPITAL_COMMUNITY)
Admission: RE | Admit: 2023-12-24 | Discharge: 2023-12-24 | Disposition: A | Discharge status: Home / Self Care | Payer: MEDICAID | Source: Ambulatory Visit | Attending: Women's Health | Admitting: Women's Health

## 2023-12-24 ENCOUNTER — Ambulatory Visit (INDEPENDENT_AMBULATORY_CARE_PROVIDER_SITE_OTHER): Payer: MEDICAID

## 2023-12-24 ENCOUNTER — Encounter: Payer: MEDICAID | Admitting: *Deleted

## 2023-12-24 VITALS — BP 110/75 | HR 74 | Wt 164.0 lb

## 2023-12-24 DIAGNOSIS — Z3A13 13 weeks gestation of pregnancy: Secondary | ICD-10-CM

## 2023-12-24 DIAGNOSIS — Z124 Encounter for screening for malignant neoplasm of cervix: Secondary | ICD-10-CM | POA: Diagnosis present

## 2023-12-24 DIAGNOSIS — B009 Herpesviral infection, unspecified: Secondary | ICD-10-CM | POA: Diagnosis not present

## 2023-12-24 DIAGNOSIS — Z3682 Encounter for antenatal screening for nuchal translucency: Secondary | ICD-10-CM | POA: Diagnosis not present

## 2023-12-24 DIAGNOSIS — O0992 Supervision of high risk pregnancy, unspecified, second trimester: Secondary | ICD-10-CM

## 2023-12-24 DIAGNOSIS — Z113 Encounter for screening for infections with a predominantly sexual mode of transmission: Secondary | ICD-10-CM | POA: Insufficient documentation

## 2023-12-24 DIAGNOSIS — Z131 Encounter for screening for diabetes mellitus: Secondary | ICD-10-CM

## 2023-12-24 DIAGNOSIS — Z6828 Body mass index (BMI) 28.0-28.9, adult: Secondary | ICD-10-CM

## 2023-12-24 DIAGNOSIS — F99 Mental disorder, not otherwise specified: Secondary | ICD-10-CM | POA: Insufficient documentation

## 2023-12-24 DIAGNOSIS — O0991 Supervision of high risk pregnancy, unspecified, first trimester: Secondary | ICD-10-CM | POA: Insufficient documentation

## 2023-12-24 DIAGNOSIS — Z8679 Personal history of other diseases of the circulatory system: Secondary | ICD-10-CM | POA: Diagnosis not present

## 2023-12-24 DIAGNOSIS — Z3490 Encounter for supervision of normal pregnancy, unspecified, unspecified trimester: Secondary | ICD-10-CM | POA: Insufficient documentation

## 2023-12-24 DIAGNOSIS — Z3401 Encounter for supervision of normal first pregnancy, first trimester: Secondary | ICD-10-CM

## 2023-12-24 HISTORY — DX: Personal history of other diseases of the circulatory system: Z86.79

## 2023-12-24 MED ORDER — PROMETHAZINE HCL 25 MG PO TABS: 12.5000 mg | ORAL_TABLET | Freq: Four times a day (QID) | ORAL | 6 refills | Status: DC | PRN

## 2023-12-24 MED ORDER — ASPIRIN 81 MG PO TBEC: 81.0000 mg | DELAYED_RELEASE_TABLET | Freq: Every day | ORAL | 3 refills | Status: DC

## 2023-12-24 NOTE — Telephone Encounter (Signed)
 Patient called asking for 3 month supply of her medications.

## 2023-12-24 NOTE — Telephone Encounter (Signed)
 Patient last seen in February 2025.  Provider gave patient 3 refills of medications.

## 2023-12-24 NOTE — Progress Notes (Signed)
 INITIAL OBSTETRICAL VISIT Patient name: Victoria [name] MRN [id number]  Date of birth: 12/16/1990 Chief Complaint:   Initial Prenatal Visit (Headache today, red area to breasts)  History of Present Illness:   Victoria [name] is a 33 y.o. G1P0 African-American female at [redacted]w[redacted]d by [place] at 9 weeks with an Estimated Date of Delivery: 06/30/24 being seen today for her initial obstetrical visit.   Patient's last menstrual period was 08/29/2023. Her obstetrical history is significant for primigravida.   Today she reports headaches and nausea, requests meds. Bilateral breasts red.  Last pap 2019. Results were:  neg     12/24/2023   10:11 AM 12/05/2023    9:01 AM 11/26/2023    2:30 PM 05/08/2023    2:58 PM 05/01/2023    8:24 AM  Depression screen PHQ 2/9  Decreased Interest 0      Down, Depressed, Hopeless 0      PHQ - 2 Score 0      Altered sleeping 2      Tired, decreased energy 2      Change in appetite 2      Feeling bad or failure about yourself  0      Trouble concentrating 0      Moving slowly or fidgety/restless 0      Suicidal thoughts 0      PHQ-9 Score 6      Difficult doing work/chores          Information is confidential and restricted. Go to Review Flowsheets to unlock data.        12/24/2023   10:12 AM 11/26/2023    2:32 PM 05/01/2023    8:23 AM  GAD 7 : Generalized Anxiety Score  Nervous, Anxious, on Edge 1    Control/stop worrying 0    Worry too much - different things 0    Trouble relaxing 0    Restless 0    Easily annoyed or irritable 0    Afraid - awful might happen 0    Total GAD 7 Score 1    Anxiety Difficulty        Information is confidential and restricted. Go to Review Flowsheets to unlock data.     Review of Systems:   Pertinent items are noted in HPI Denies cramping/contractions, leakage of fluid, vaginal bleeding, abnormal vaginal discharge w/ itching/odor/irritation, headaches, visual changes, shortness of breath, chest pain, abdominal  pain, severe nausea/vomiting, or problems with urination or bowel movements unless otherwise stated above.  Pertinent History Reviewed:  Reviewed past medical,surgical, social, obstetrical and family history.  Reviewed problem list, medications and allergies. OB History  Gravida Para Term Preterm AB Living  1       SAB IAB Ectopic Multiple Live Births          # Outcome Date GA Lbr Len/2nd Weight Sex Type Anes PTL Lv  1 Current            Physical Assessment:   Vitals:   12/24/23 1048  BP: 110/75  Pulse: 74  Weight: 164 lb (74.4 kg)  Body mass index is 28.15 kg/m.       Physical Examination:  General appearance - well appearing, and in no distress  Mental status - alert, oriented to person, place, and time  Psych:  She has a normal mood and affect  Skin - warm and dry, normal color, no suspicious lesions noted  Chest - effort normal, all lung fields clear to auscultation  bilaterally  Heart - normal rate and regular rhythm  Abdomen - soft, nontender  Extremities:  No swelling or varicosities noted  Pelvic - VULVA: normal appearing vulva with no masses, tenderness or lesions  VAGINA: normal appearing vagina with normal color and discharge, no lesions  CERVIX: normal appearing cervix without discharge or lesions, no CMT  Thin prep pap is done w/ HR HPV cotesting  Chaperone: Peggy Dones  TODAY'S NT [place] 13 wks,measurements c/w dates,FHR 150 bpm,anterior placenta gr 0,normal ovaries,NT 1.7 mm,NB present,CRL 73.43 mm   No results found for this or any previous visit (from the past 24 hours).  Assessment & Plan:  1) Low-Risk Pregnancy G1P0 at [redacted]w[redacted]d with an Estimated Date of Delivery: 06/30/24   2) Initial OB visit  3) Bipolar/schizoaffective disorder/PTSD/dep/anx> on seroquel, zoloft, trazadone w/ MCBH, discussed we usually don't like to continue trazadone during pregnancy, has discussed risks/benefits w/ her MH provider and prefers to stay on it. Has therapy there as well  4) H/O  alcohol use disorder> took w/drawal meds last July, none since!  5) H/O CHTN> 'long time ago', never on meds, bp's great! ASA and baseline labs today  6) Headache> likely r/t decreased po intake from nausea, can take apap  7) Nausea> offered diclegis vs phenergan, prefers phenergan- rx sent  Meds:  Meds ordered this encounter  Medications   aspirin EC 81 MG tablet    Sig: Take 1 tablet (81 mg total) by mouth daily. Swallow whole.    Dispense:  90 tablet    Refill:  3   promethazine (PHENERGAN) 25 MG tablet    Sig: Take 0.5-1 tablets (12.5-25 mg total) by mouth every 6 (six) hours as needed.    Dispense:  30 tablet    Refill:  6    Initial labs obtained Continue prenatal vitamins Reviewed n/v relief measures and warning s/s to report Reviewed recommended weight gain based on pre-gravid BMI Encouraged well-balanced diet Genetic & carrier screening discussed: requests Panorama and NT/IT, declines Horizon  Ultrasound discussed; fetal survey: requested CCNC completed> form faxed if has or is planning to apply for medicaid The nature of [place] [facility] [employer] with multiple MDs and other Advanced Practice Providers was explained to patient; also emphasized that fellows, residents, and students are part of our team. Does have home bp cuff. Office bp cuff given: no. Rx sent: no. Check bp weekly, let us know if consistently >140/90.   Indications for ASA therapy (per uptodate) OR Two or more of the following: Nulliparity Yes Sociodemographic characteristics (African American race, low socioeconomic level) Yes  Follow-up: Return in about 3 weeks (around 01/14/2024) for LROB, 2nd IT, CNM, in person; then 6wks from now anatomy u/s w/ LROB w/ CNM.   Orders Placed This Encounter  Procedures   Urine Culture   Integrated 1   CBC/D/Plt+RPR+Rh+ABO+RubIgG...   Comprehensive metabolic panel   Protein / creatinine ratio, urine   PANORAMA PRENATAL TEST   Hemoglobin A1c    [facility] [address] [place], [facility] 12/24/2023 11:33 AM

## 2023-12-24 NOTE — Progress Notes (Signed)
 [place] 13 wks,measurements c/w dates,FHR 150 bpm,anterior placenta gr 0,normal ovaries,NT 1.7 mm,NB present,CRL 73.43 mm

## 2023-12-24 NOTE — Patient Instructions (Signed)
 [email or web address], thank you for choosing our office today! We appreciate the opportunity to meet your healthcare needs. You may receive a short survey by mail, e-mail, or through [employer]. If you are happy with your care we would appreciate if you could take just a few minutes to complete the survey questions. We read all of your comments and take your feedback very seriously. Thank you again for choosing our office.  [facility] [employer] [facility] Team at [facility]  [facility] at [facility] ([address] [place], [place] [address]) Entrance C, located off of [address] Free 24/7 valet parking   Nausea & Vomiting Have saltine crackers or pretzels by your bed and eat a few bites before you raise your head out of bed in the morning Eat small frequent meals throughout the day instead of large meals Drink plenty of fluids throughout the day to stay hydrated, just don't drink a lot of fluids with your meals.  This can make your stomach fill up faster making you feel sick Do not brush your teeth right after you eat Products with real ginger are good for nausea, like ginger ale and ginger hard candy Make sure it says made with real ginger! Sucking on sour candy like lemon heads is also good for nausea If your prenatal vitamins make you nauseated, take them at night so you will sleep through the nausea Sea Bands If you feel like you need medicine for the nausea & vomiting please let us know If you are unable to keep any fluids or food down please let us know   Constipation Drink plenty of fluid, preferably water, throughout the day Eat foods high in fiber such as fruits, vegetables, and grains Exercise, such as walking, is a good way to keep your bowels regular Drink warm fluids, especially warm prune juice, or decaf coffee Eat a 1/2 cup of real oatmeal (not instant), 1/2 cup applesauce, and 1/2-1 cup warm prune juice every day If needed, you may take Colace (docusate sodium) stool  softener once or twice a day to help keep the stool soft.  If you still are having problems with constipation, you may take Miralax once daily as needed to help keep your bowels regular.   Home Blood Pressure Monitoring for Patients   Your provider has recommended that you check your blood pressure (BP) at least once a week at home. If you do not have a blood pressure cuff at home, one will be provided for you. Contact your provider if you have not received your monitor within 1 week.   Helpful Tips for Accurate Home Blood Pressure Checks  Don't smoke, exercise, or drink caffeine 30 minutes before checking your BP Use the restroom before checking your BP (a full bladder can raise your pressure) Relax in a comfortable upright chair Feet on the ground Left arm resting comfortably on a flat surface at the level of your heart Legs uncrossed Back supported Sit quietly and don't talk Place the cuff on your bare arm Adjust snuggly, so that only two fingertips can fit between your skin and the top of the cuff Check 2 readings separated by at least one minute Keep a log of your BP readings For a visual, please reference this diagram: http://ccnc.care/bpdiagram  Provider Name: [facility]     Phone: [phone]  Zone 1: ALL CLEAR  Continue to monitor your symptoms:  BP reading is less than 140 (top number) or less than 90 (bottom  number)  No right upper stomach pain No headaches or seeing spots No feeling nauseated or throwing up No swelling in face and hands  Zone 2: CAUTION Call your doctor's office for any of the following:  BP reading is greater than 140 (top number) or greater than 90 (bottom number)  Stomach pain under your ribs in the middle or right side Headaches or seeing spots Feeling nauseated or throwing up Swelling in face and hands  Zone 3: EMERGENCY  Seek immediate medical care if you have any of the following:  BP reading is greater than160 (top number) or  greater than 110 (bottom number) Severe headaches not improving with Tylenol Serious difficulty catching your breath Any worsening symptoms from Zone 2    First Trimester of Pregnancy The first trimester of pregnancy is from week 1 until the end of week 12 (months 1 through 3). A week after a sperm fertilizes an egg, the egg will implant on the wall of the uterus. This embryo will begin to develop into a baby. Genes from you and your partner are forming the baby. The female genes determine whether the baby is a boy or a girl. At 6-8 weeks, the eyes and face are formed, and the heartbeat can be seen on ultrasound. At the end of 12 weeks, all the baby's organs are formed.  Now that you are pregnant, you will want to do everything you can to have a healthy baby. Two of the most important things are to get good prenatal care and to follow your health care provider's instructions. Prenatal care is all the medical care you receive before the baby's birth. This care will help prevent, find, and treat any problems during the pregnancy and childbirth. BODY CHANGES Your body goes through many changes during pregnancy. The changes vary from woman to woman.  You may gain or lose a couple of pounds at first. You may feel sick to your stomach (nauseous) and throw up (vomit). If the vomiting is uncontrollable, call your health care provider. You may tire easily. You may develop headaches that can be relieved by medicines approved by your health care provider. You may urinate more often. Painful urination may mean you have a bladder infection. You may develop heartburn as a result of your pregnancy. You may develop constipation because certain hormones are causing the muscles that push waste through your intestines to slow down. You may develop hemorrhoids or swollen, bulging veins (varicose veins). Your breasts may begin to grow larger and become tender. Your nipples may stick out more, and the tissue that  surrounds them (areola) may become darker. Your gums may bleed and may be sensitive to brushing and flossing. Dark spots or blotches (chloasma, mask of pregnancy) may develop on your face. This will likely fade after the baby is born. Your menstrual periods will stop. You may have a loss of appetite. You may develop cravings for certain kinds of food. You may have changes in your emotions from day to day, such as being excited to be pregnant or being concerned that something may go wrong with the pregnancy and baby. You may have more vivid and strange dreams. You may have changes in your hair. These can include thickening of your hair, rapid growth, and changes in texture. Some women also have hair loss during or after pregnancy, or hair that feels dry or thin. Your hair will most likely return to normal after your baby is born. WHAT TO EXPECT AT YOUR PRENATAL  VISITS During a routine prenatal visit: You will be weighed to make sure you and the baby are growing normally. Your blood pressure will be taken. Your abdomen will be measured to track your baby's growth. The fetal heartbeat will be listened to starting around week 10 or 12 of your pregnancy. Test results from any previous visits will be discussed. Your health care provider may ask you: How you are feeling. If you are feeling the baby move. If you have had any abnormal symptoms, such as leaking fluid, bleeding, severe headaches, or abdominal cramping. If you have any questions. Other tests that may be performed during your first trimester include: Blood tests to find your blood type and to check for the presence of any previous infections. They will also be used to check for low iron levels (anemia) and Rh antibodies. Later in the pregnancy, blood tests for diabetes will be done along with other tests if problems develop. Urine tests to check for infections, diabetes, or protein in the urine. An ultrasound to confirm the proper growth  and development of the baby. An amniocentesis to check for possible genetic problems. Fetal screens for spina bifida and Down syndrome. You may need other tests to make sure you and the baby are doing well. HOME CARE INSTRUCTIONS  Medicines Follow your health care provider's instructions regarding medicine use. Specific medicines may be either safe or unsafe to take during pregnancy. Take your prenatal vitamins as directed. If you develop constipation, try taking a stool softener if your health care provider approves. Diet Eat regular, well-balanced meals. Choose a variety of foods, such as meat or vegetable-based protein, fish, milk and low-fat dairy products, vegetables, fruits, and whole grain breads and cereals. Your health care provider will help you determine the amount of weight gain that is right for you. Avoid raw meat and uncooked cheese. These carry germs that can cause birth defects in the baby. Eating four or five small meals rather than three large meals a day may help relieve nausea and vomiting. If you start to feel nauseous, eating a few soda crackers can be helpful. Drinking liquids between meals instead of during meals also seems to help nausea and vomiting. If you develop constipation, eat more high-fiber foods, such as fresh vegetables or fruit and whole grains. Drink enough fluids to keep your urine clear or pale yellow. Activity and Exercise Exercise only as directed by your health care provider. Exercising will help you: Control your weight. Stay in shape. Be prepared for labor and delivery. Experiencing pain or cramping in the lower abdomen or low back is a good sign that you should stop exercising. Check with your health care provider before continuing normal exercises. Try to avoid standing for long periods of time. Move your legs often if you must stand in one place for a long time. Avoid heavy lifting. Wear low-heeled shoes, and practice good posture. You may  continue to have sex unless your health care provider directs you otherwise. Relief of Pain or Discomfort Wear a good support bra for breast tenderness.   Take warm sitz baths to soothe any pain or discomfort caused by hemorrhoids. Use hemorrhoid cream if your health care provider approves.   Rest with your legs elevated if you have leg cramps or low back pain. If you develop varicose veins in your legs, wear support hose. Elevate your feet for 15 minutes, 3-4 times a day. Limit salt in your diet. Prenatal Care Schedule your prenatal visits by the  twelfth week of pregnancy. They are usually scheduled monthly at first, then more often in the last 2 months before delivery. Write down your questions. Take them to your prenatal visits. Keep all your prenatal visits as directed by your health care provider. Safety Wear your seat belt at all times when driving. Make a list of emergency phone numbers, including numbers for family, friends, the hospital, and police and fire departments. General Tips Ask your health care provider for a referral to a local prenatal education class. Begin classes no later than at the beginning of month 6 of your pregnancy. Ask for help if you have counseling or nutritional needs during pregnancy. Your health care provider can offer advice or refer you to specialists for help with various needs. Do not use hot tubs, steam rooms, or saunas. Do not douche or use tampons or scented sanitary pads. Do not cross your legs for long periods of time. Avoid cat litter boxes and soil used by cats. These carry germs that can cause birth defects in the baby and possibly loss of the fetus by miscarriage or stillbirth. Avoid all smoking, herbs, alcohol, and medicines not prescribed by your health care provider. Chemicals in these affect the formation and growth of the baby. Schedule a dentist appointment. At home, brush your teeth with a soft toothbrush and be gentle when you floss. SEEK  MEDICAL CARE IF:  You have dizziness. You have mild pelvic cramps, pelvic pressure, or nagging pain in the abdominal area. You have persistent nausea, vomiting, or diarrhea. You have a bad smelling vaginal discharge. You have pain with urination. You notice increased swelling in your face, hands, legs, or ankles. SEEK IMMEDIATE MEDICAL CARE IF:  You have a fever. You are leaking fluid from your vagina. You have spotting or bleeding from your vagina. You have severe abdominal cramping or pain. You have rapid weight gain or loss. You vomit blood or material that looks like coffee grounds. You are exposed to [place] measles and have never had them. You are exposed to fifth disease or chickenpox. You develop a severe headache. You have shortness of breath. You have any kind of trauma, such as from a fall or a car accident. Document Released: 10/02/2001 Document Revised: 02/22/2014 Document Reviewed: 08/18/2013 [facility] Patient Information 2015 [place], [place]. This information is not intended to replace advice given to you by your health care provider. Make sure you discuss any questions you have with your health care provider.

## 2023-12-25 ENCOUNTER — Encounter: Payer: Self-pay | Admitting: Women's Health

## 2023-12-25 LAB — INTEGRATED 1

## 2023-12-26 ENCOUNTER — Encounter: Payer: Self-pay | Admitting: Women's Health

## 2023-12-26 LAB — COMPREHENSIVE METABOLIC PANEL
ALT: 19 IU/L (ref 0–32)
AST: 17 IU/L (ref 0–40)
Albumin: 4.6 g/dL (ref 3.9–4.9)
Alkaline Phosphatase: 59 IU/L (ref 44–121)
BUN/Creatinine Ratio: 7 — ABNORMAL LOW (ref 9–23)
BUN: 4 mg/dL — ABNORMAL LOW (ref 6–20)
Bilirubin Total: 0.3 mg/dL (ref 0.0–1.2)
CO2: 20 mmol/L (ref 20–29)
Calcium: 10 mg/dL (ref 8.7–10.2)
Chloride: 103 mmol/L (ref 96–106)
Creatinine, Ser: 0.58 mg/dL (ref 0.57–1.00)
Globulin, Total: 2.6 g/dL (ref 1.5–4.5)
Glucose: 77 mg/dL (ref 70–99)
Potassium: 3.9 mmol/L (ref 3.5–5.2)
Sodium: 138 mmol/L (ref 134–144)
Total Protein: 7.2 g/dL (ref 6.0–8.5)
eGFR: 124 mL/min/{1.73_m2} (ref >59)

## 2023-12-26 LAB — CBC/D/PLT+RPR+RH+ABO+RUBIGG...
Antibody Screen: NEGATIVE
Basophils Absolute: 0 10*3/uL (ref 0.0–0.2)
Basos: 0 %
EOS (ABSOLUTE): 0.1 10*3/uL (ref 0.0–0.4)
Eos: 1 %
HIV Screen 4th Generation wRfx: NONREACTIVE
Hematocrit: 40.4 % (ref 34.0–46.6)
Hemoglobin: 13.2 g/dL (ref 11.1–15.9)
Immature Grans (Abs): 0 10*3/uL (ref 0.0–0.1)
Immature Granulocytes: 0 %
Lymphocytes Absolute: 1.8 10*3/uL (ref 0.7–3.1)
Lymphs: 30 %
MCH: 27 pg (ref 26.6–33.0)
MCHC: 32.7 g/dL (ref 31.5–35.7)
MCV: 83 fL (ref 79–97)
Monocytes Absolute: 0.4 10*3/uL (ref 0.1–0.9)
Monocytes: 7 %
Neutrophils Absolute: 3.7 10*3/uL (ref 1.4–7.0)
Neutrophils: 62 %
Platelets: 305 10*3/uL (ref 150–450)
RBC: 4.88 x10E6/uL (ref 3.77–5.28)
RDW: 12.4 % (ref 11.7–15.4)
RPR Ser Ql: NONREACTIVE
Rh Factor: POSITIVE
Rubella Antibodies, IGG: 4.7 {index} (ref >0.99)
WBC: 6 10*3/uL (ref 3.4–10.8)

## 2023-12-26 LAB — CYTOLOGY - PAP
Chlamydia: NEGATIVE
Comment: NEGATIVE
Comment: NEGATIVE
Comment: NORMAL
Diagnosis: NEGATIVE
High risk HPV: NEGATIVE
Neisseria Gonorrhea: NEGATIVE

## 2023-12-26 LAB — INTEGRATED 1
Crown Rump Length: 73.4 mm
Gest. Age on Collection Date: 13.3 wk
PAPP-A Value: 1003.5 ng/mL
Race: 1
Sonographer ID#: 309760
Sonographer ID#: 31.8 a
Weight: 1.7 mm
Weight: 164 [lb_av]

## 2023-12-26 LAB — HGB FRACTIONATION CASCADE
Hgb A2: 2.3 % (ref 1.8–3.2)
Hgb A: 97.4 % (ref 96.4–98.8)
Hgb F: 0.3 % (ref 0.0–2.0)
Hgb S: 0 %

## 2023-12-26 LAB — PROTEIN / CREATININE RATIO, URINE
Creatinine, Urine: 9.7 mg/dL
Protein, Ur: <4 mg/dL

## 2023-12-26 LAB — HCV INTERPRETATION

## 2023-12-26 LAB — URINE CULTURE

## 2023-12-26 LAB — HEMOGLOBIN A1C
Est. average glucose Bld gHb Est-mCnc: 117 mg/dL
Hgb A1c MFr Bld: 5.7 % — ABNORMAL HIGH (ref 4.8–5.6)

## 2023-12-27 ENCOUNTER — Other Ambulatory Visit: Payer: MEDICAID

## 2023-12-27 DIAGNOSIS — Z3A13 13 weeks gestation of pregnancy: Secondary | ICD-10-CM

## 2023-12-27 DIAGNOSIS — R7309 Other abnormal glucose: Secondary | ICD-10-CM

## 2023-12-27 DIAGNOSIS — Z131 Encounter for screening for diabetes mellitus: Secondary | ICD-10-CM

## 2023-12-28 LAB — GLUCOSE TOLERANCE, 2 HOURS W/ 1HR
Glucose, 1 hour: 93 mg/dL (ref 70–179)
Glucose, 2 hour: 87 mg/dL (ref 70–152)
Glucose, Fasting: 84 mg/dL (ref 70–91)

## 2023-12-30 ENCOUNTER — Encounter: Payer: Self-pay | Admitting: Women's Health

## 2024-01-01 ENCOUNTER — Encounter (HOSPITAL_COMMUNITY): Payer: No Payment, Other | Admitting: Psychiatry

## 2024-01-06 LAB — PANORAMA PRENATAL TEST FULL PANEL:PANORAMA TEST PLUS 5 ADDITIONAL MICRODELETIONS: FETAL FRACTION: 5.9

## 2024-01-08 ENCOUNTER — Ambulatory Visit (INDEPENDENT_AMBULATORY_CARE_PROVIDER_SITE_OTHER): Payer: MEDICAID | Admitting: Advanced Practice Midwife

## 2024-01-08 VITALS — BP 118/72 | HR 103 | Wt 170.0 lb

## 2024-01-08 DIAGNOSIS — Z3A15 15 weeks gestation of pregnancy: Secondary | ICD-10-CM | POA: Diagnosis not present

## 2024-01-08 DIAGNOSIS — Z3402 Encounter for supervision of normal first pregnancy, second trimester: Secondary | ICD-10-CM

## 2024-01-08 NOTE — Progress Notes (Signed)
   LOW-RISK PREGNANCY VISIT Patient name: Victoria [name] MRN [id number]  Date of birth: 07/06/1991 Chief Complaint:   Routine Prenatal Visit  History of Present Illness:   Victoria [name] is a 33 y.o. G1P0 female at [redacted]w[redacted]d with an Estimated Date of Delivery: 06/30/24 being seen today for ongoing management of a low-risk pregnancy.  Today she reports  some R round lig discomfort; constipation . Contractions: Not present. Vag. Bleeding: None.  Movement: Present. denies leaking of fluid. Review of Systems:   Pertinent items are noted in HPI Denies abnormal vaginal discharge w/ itching/odor/irritation, headaches, visual changes, shortness of breath, chest pain, abdominal pain, severe nausea/vomiting, or problems with urination or bowel movements unless otherwise stated above. Pertinent History Reviewed:  Reviewed past medical,surgical, social, obstetrical and family history.  Reviewed problem list, medications and allergies. Physical Assessment:   Vitals:   01/08/24 0849  BP: 118/72  Pulse: (!) 103  Weight: 170 lb (77.1 kg)  Body mass index is 29.18 kg/m.        Physical Examination:   General appearance: Well appearing, and in no distress  Mental status: Alert, oriented to person, place, and time  Skin: Warm & dry  Cardiovascular: Normal heart rate noted  Respiratory: Normal respiratory effort, no distress  Abdomen: Soft, gravid, nontender  Pelvic: Cervical exam deferred         Extremities: Edema: None  Fetal Status: Fetal Heart Rate (bpm): 145   Movement: Present    No results found for this or any previous visit (from the past 24 hours).  Assessment & Plan:  1) Low-risk pregnancy G1P0 at [redacted]w[redacted]d with an Estimated Date of Delivery: 06/30/24   2) Constipation, tips given; may need daily Miralax to start, then daily tips given  3) Bipolar/schizoaffective d/o/ PTSD/dep/anx, on Seroquel, Trazodone, Zoloft and has therapy w MCBH  4) ^HgbA1c of 5.7 w nl GTT, repeat GTT  ~28wks   Meds: No orders of the defined types were placed in this encounter.  Labs/procedures today: 2nd IT  Plan:  Continue routine obstetrical care   Reviewed: Preterm labor symptoms and general obstetric precautions including but not limited to vaginal bleeding, contractions, leaking of fluid and fetal movement were reviewed in detail with the patient.  All questions were answered. Has home bp cuff. Check bp weekly, let us know if >140/90.   Follow-up: Return for As scheduled. (Anatomy u/s)  Orders Placed This Encounter  Procedures   INTEGRATED 2   Arabella Merles [facility] 01/08/2024 9:38 AM

## 2024-01-08 NOTE — Patient Instructions (Signed)
 [email or web address], thank you for choosing our office today! We appreciate the opportunity to meet your healthcare needs. You may receive a short survey by mail, e-mail, or through [employer]. If you are happy with your care we would appreciate if you could take just a few minutes to complete the survey questions. We read all of your comments and take your feedback very seriously. Thank you again for choosing our office.  Center for [employer] Team at [facility] at [facility] ([address] [place], [place] [address]) Entrance C, located off of [address] Free 24/7 valet parking  Go to [email or web address] to register for FREE online childbirth classes  Call the office [phone]) or go to [facility] if: You begin to severe cramping Your water breaks.  Sometimes it is a big gush of fluid, sometimes it is just a trickle that keeps getting your panties wet or running down your legs You have vaginal bleeding.  It is normal to have a small amount of spotting if your cervix was checked.   [facility] Pediatricians/Family Doctors [facility]): [address] Colette Ribas, [phone]           [facility]: [address], [phone]                [facility]): [address], [phone] (call to ask if accepting patients) [facility] Department: [address], [place], [phone]    [facility] Pediatricians/Family Doctors [facility]): [phone] [address], [phone] [facility]: [address] [place], [phone] [facility]: [address], [phone]  [facility] Doctors  [facility]): [phone] [facility]: [address] [phone]   [facility] Doctors [facility]: [address], [phone]  [facility] Doctors  [facility]: [phone], [phone]  Home Blood Pressure Monitoring for Patients   Your provider has recommended that you check your blood pressure (BP) at least once a week at home. If you do not have a blood pressure cuff at home, one will be provided for you. Contact your provider if you have not received your monitor within 1 week.   Helpful Tips for Accurate Home Blood Pressure Checks  Don't smoke, exercise, or drink caffeine 30 minutes before checking your BP Use the restroom before checking your BP (a full bladder can raise your pressure) Relax in a comfortable upright chair Feet on the ground Left arm resting comfortably on a flat surface at the level of your heart Legs uncrossed Back supported Sit quietly and don't talk Place the cuff on your bare arm Adjust snuggly, so that only two fingertips can fit between your skin and the top of the cuff Check 2 readings separated by at least one minute Keep a log of your BP readings For a visual, please reference this diagram: http://ccnc.care/bpdiagram  Provider Name: [facility]     Phone: [phone]  Zone 1: ALL CLEAR  Continue to monitor your symptoms:  BP reading is less than 140 (top number) or less than 90 (bottom number)  No right upper stomach pain No headaches or seeing spots No feeling nauseated or throwing up No swelling in face and hands  Zone 2: CAUTION Call your doctor's office for any of the following:  BP reading is greater than 140 (top number) or greater than  90 (bottom number)  Stomach pain under your ribs in the middle or right side Headaches or seeing spots Feeling nauseated or throwing up Swelling in face and hands  Zone 3: EMERGENCY  Seek immediate medical care if you have any of the following:  BP reading is greater than160 (top number) or greater than 110 (bottom number) Severe headaches not improving with Tylenol Serious difficulty catching your breath Any worsening symptoms from  Zone 2     Second Trimester of Pregnancy The second trimester is from week 14 through week 27 (months 4 through 6). The second trimester is often a time when you feel your best. Your body has adjusted to being pregnant, and you begin to feel better physically. Usually, morning sickness has lessened or quit completely, you may have more energy, and you may have an increase in appetite. The second trimester is also a time when the fetus is growing rapidly. At the end of the sixth month, the fetus is about 9 inches long and weighs about 1 pounds. You will likely begin to feel the baby move (quickening) between 16 and 20 weeks of pregnancy. Body changes during your second trimester Your body continues to go through many changes during your second trimester. The changes vary from woman to woman. Your weight will continue to increase. You will notice your lower abdomen bulging out. You may begin to get stretch marks on your hips, abdomen, and breasts. You may develop headaches that can be relieved by medicines. The medicines should be approved by your health care provider. You may urinate more often because the fetus is pressing on your bladder. You may develop or continue to have heartburn as a result of your pregnancy. You may develop constipation because certain hormones are causing the muscles that push waste through your intestines to slow down. You may develop hemorrhoids or swollen, bulging veins (varicose veins). You may have back pain. This is caused by: Weight gain. Pregnancy hormones that are relaxing the joints in your pelvis. A shift in weight and the muscles that support your balance. Your breasts will continue to grow and they will continue to become tender. Your gums may bleed and may be sensitive to brushing and flossing. Dark spots or blotches (chloasma, mask of pregnancy) may develop on your face. This will likely fade after the baby is born. A dark line from your belly button to  the pubic area (linea nigra) may appear. This will likely fade after the baby is born. You may have changes in your hair. These can include thickening of your hair, rapid growth, and changes in texture. Some women also have hair loss during or after pregnancy, or hair that feels dry or thin. Your hair will most likely return to normal after your baby is born.  What to expect at prenatal visits During a routine prenatal visit: You will be weighed to make sure you and the fetus are growing normally. Your blood pressure will be taken. Your abdomen will be measured to track your baby's growth. The fetal heartbeat will be listened to. Any test results from the previous visit will be discussed.  Your health care provider may ask you: How you are feeling. If you are feeling the baby move. If you have had any abnormal symptoms, such as leaking fluid, bleeding, severe headaches, or abdominal cramping. If you are using any tobacco products, including cigarettes, chewing tobacco, and electronic cigarettes. If you have any questions.  Other tests that may be performed during  your second trimester include: Blood tests that check for: Low iron levels (anemia). High blood sugar that affects pregnant women (gestational diabetes) between 53 and 28 weeks. Rh antibodies. This is to check for a protein on red blood cells (Rh factor). Urine tests to check for infections, diabetes, or protein in the urine. An ultrasound to confirm the proper growth and development of the baby. An amniocentesis to check for possible genetic problems. Fetal screens for spina bifida and Down syndrome. HIV (human immunodeficiency virus) testing. Routine prenatal testing includes screening for HIV, unless you choose not to have this test.  Follow these instructions at home: Medicines Follow your health care provider's instructions regarding medicine use. Specific medicines may be either safe or unsafe to take during  pregnancy. Take a prenatal vitamin that contains at least 600 micrograms (mcg) of folic acid. If you develop constipation, try taking a stool softener if your health care provider approves. Eating and drinking Eat a balanced diet that includes fresh fruits and vegetables, whole grains, good sources of protein such as meat, eggs, or tofu, and low-fat dairy. Your health care provider will help you determine the amount of weight gain that is right for you. Avoid raw meat and uncooked cheese. These carry germs that can cause birth defects in the baby. If you have low calcium intake from food, talk to your health care provider about whether you should take a daily calcium supplement. Limit foods that are high in fat and processed sugars, such as fried and sweet foods. To prevent constipation: Drink enough fluid to keep your urine clear or pale yellow. Eat foods that are high in fiber, such as fresh fruits and vegetables, whole grains, and beans. Activity Exercise only as directed by your health care provider. Most women can continue their usual exercise routine during pregnancy. Try to exercise for 30 minutes at least 5 days a week. Stop exercising if you experience uterine contractions. Avoid heavy lifting, wear low heel shoes, and practice good posture. A sexual relationship may be continued unless your health care provider directs you otherwise. Relieving pain and discomfort Wear a good support bra to prevent discomfort from breast tenderness. Take warm sitz baths to soothe any pain or discomfort caused by hemorrhoids. Use hemorrhoid cream if your health care provider approves. Rest with your legs elevated if you have leg cramps or low back pain. If you develop varicose veins, wear support hose. Elevate your feet for 15 minutes, 3-4 times a day. Limit salt in your diet. Prenatal Care Write down your questions. Take them to your prenatal visits. Keep all your prenatal visits as told by your health  care provider. This is important. Safety Wear your seat belt at all times when driving. Make a list of emergency phone numbers, including numbers for family, friends, the hospital, and police and fire departments. General instructions Ask your health care provider for a referral to a local prenatal education class. Begin classes no later than the beginning of month 6 of your pregnancy. Ask for help if you have counseling or nutritional needs during pregnancy. Your health care provider can offer advice or refer you to specialists for help with various needs. Do not use hot tubs, steam rooms, or saunas. Do not douche or use tampons or scented sanitary pads. Do not cross your legs for long periods of time. Avoid cat litter boxes and soil used by cats. These carry germs that can cause birth defects in the baby and possibly loss of the  fetus by miscarriage or stillbirth. Avoid all smoking, herbs, alcohol, and unprescribed drugs. Chemicals in these products can affect the formation and growth of the baby. Do not use any products that contain nicotine or tobacco, such as cigarettes and e-cigarettes. If you need help quitting, ask your health care provider. Visit your dentist if you have not gone yet during your pregnancy. Use a soft toothbrush to brush your teeth and be gentle when you floss. Contact a health care provider if: You have dizziness. You have mild pelvic cramps, pelvic pressure, or nagging pain in the abdominal area. You have persistent nausea, vomiting, or diarrhea. You have a bad smelling vaginal discharge. You have pain when you urinate. Get help right away if: You have a fever. You are leaking fluid from your vagina. You have spotting or bleeding from your vagina. You have severe abdominal cramping or pain. You have rapid weight gain or weight loss. You have shortness of breath with chest pain. You notice sudden or extreme swelling of your face, hands, ankles, feet, or legs. You  have not felt your baby move in over an hour. You have severe headaches that do not go away when you take medicine. You have vision changes. Summary The second trimester is from week 14 through week 27 (months 4 through 6). It is also a time when the fetus is growing rapidly. Your body goes through many changes during pregnancy. The changes vary from woman to woman. Avoid all smoking, herbs, alcohol, and unprescribed drugs. These chemicals affect the formation and growth your baby. Do not use any tobacco products, such as cigarettes, chewing tobacco, and e-cigarettes. If you need help quitting, ask your health care provider. Contact your health care provider if you have any questions. Keep all prenatal visits as told by your health care provider. This is important. This information is not intended to replace advice given to you by your health care provider. Make sure you discuss any questions you have with your health care provider. Document Released: 10/02/2001 Document Revised: 03/15/2016 Document Reviewed: 12/09/2012 Elsevier Interactive Patient Education  2017 [employer].

## 2024-01-08 NOTE — Progress Notes (Signed)
 No problems today. Plans AFP blood test today.

## 2024-01-09 ENCOUNTER — Encounter: Payer: MEDICAID | Admitting: Women's Health

## 2024-01-10 LAB — INTEGRATED 2
AFP MoM: 1.06
Alpha-Fetoprotein: 31.8 ng/mL
Crown Rump Length: 73.4 mm
DIA MoM: 0.54
DIA Value: 85.9 pg/mL
Estriol, Unconjugated: 0.85 ng/mL
Gest. Age on Collection Date: 13.3 wk
Gestational Age: 15.4 wk
Maternal Age at EDD: 31.8 a
Nuchal Translucency (NT): 1.7 mm
Nuchal Translucency MoM: 0.84
Number of Fetuses: 1
PAPP-A MoM: 1.06
PAPP-A Value: 1003.5 ng/mL
Sonographer ID#: 309760
Test Results:: NEGATIVE
Weight: 164 [lb_av]
Weight: 164 [lb_av]
hCG MoM: 0.58
hCG Value: 24 [IU]/mL
uE3 MoM: 0.95

## 2024-01-11 ENCOUNTER — Encounter: Payer: Self-pay | Admitting: Advanced Practice Midwife

## 2024-01-14 ENCOUNTER — Encounter: Payer: MEDICAID | Admitting: Women's Health

## 2024-01-16 ENCOUNTER — Other Ambulatory Visit (HOSPITAL_COMMUNITY): Payer: Self-pay | Admitting: Psychiatry

## 2024-01-22 ENCOUNTER — Other Ambulatory Visit: Payer: Self-pay | Admitting: Advanced Practice Midwife

## 2024-01-22 MED ORDER — PRENATAL 27-0.8 MG PO TABS: 1.0000 | ORAL_TABLET | Freq: Every day | ORAL | 4 refills | Status: DC

## 2024-01-24 ENCOUNTER — Telehealth: Payer: Self-pay | Admitting: Obstetrics & Gynecology

## 2024-01-24 NOTE — Telephone Encounter (Signed)
 Pt states her pharmacy has not received her prenatal prescription and is requesting that it be sent again.

## 2024-01-27 ENCOUNTER — Telehealth: Payer: Self-pay | Admitting: *Deleted

## 2024-01-27 NOTE — Telephone Encounter (Signed)
 Called patient. States she does not have much of an appetite and still does not feel fetal movement.  Informed she has anterior placenta which can affect feeling fetal movement. Advised to try to eat small frequent snacks throughout the day as well and continue to drink plenty of fluids.  Offered patient appt for FHT check but patient declined.  No further questions.

## 2024-01-28 ENCOUNTER — Other Ambulatory Visit: Payer: Self-pay | Admitting: Obstetrics & Gynecology

## 2024-01-28 DIAGNOSIS — Z3689 Encounter for other specified antenatal screening: Secondary | ICD-10-CM
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
## 2024-02-04 ENCOUNTER — Ambulatory Visit (INDEPENDENT_AMBULATORY_CARE_PROVIDER_SITE_OTHER): Payer: MEDICAID | Admitting: Psychiatry

## 2024-02-04 ENCOUNTER — Ambulatory Visit: Payer: MEDICAID | Admitting: Radiology

## 2024-02-04 ENCOUNTER — Encounter (HOSPITAL_COMMUNITY): Payer: Self-pay | Admitting: Psychiatry

## 2024-02-04 ENCOUNTER — Encounter: Payer: Self-pay | Admitting: Women's Health

## 2024-02-04 ENCOUNTER — Ambulatory Visit: Payer: MEDICAID | Admitting: Women's Health

## 2024-02-04 VITALS — BP 136/74 | HR 97 | Wt 169.0 lb

## 2024-02-04 DIAGNOSIS — Z3689 Encounter for other specified antenatal screening: Secondary | ICD-10-CM

## 2024-02-04 DIAGNOSIS — Z3A19 19 weeks gestation of pregnancy: Secondary | ICD-10-CM

## 2024-02-04 DIAGNOSIS — F4310 Post-traumatic stress disorder, unspecified: Secondary | ICD-10-CM

## 2024-02-04 DIAGNOSIS — F4010 Social phobia, unspecified: Secondary | ICD-10-CM

## 2024-02-04 DIAGNOSIS — F411 Generalized anxiety disorder: Secondary | ICD-10-CM | POA: Diagnosis not present

## 2024-02-04 DIAGNOSIS — Z3402 Encounter for supervision of normal first pregnancy, second trimester: Secondary | ICD-10-CM | POA: Diagnosis not present

## 2024-02-04 DIAGNOSIS — F1221 Cannabis dependence, in remission: Secondary | ICD-10-CM | POA: Diagnosis not present

## 2024-02-04 DIAGNOSIS — F3181 Bipolar II disorder: Secondary | ICD-10-CM | POA: Diagnosis not present

## 2024-02-04 MED ORDER — GABAPENTIN 100 MG PO CAPS: 100.0000 mg | ORAL_CAPSULE | Freq: Two times a day (BID) | ORAL | 1 refills | Status: DC | PRN

## 2024-02-04 MED ORDER — TRAZODONE HCL 50 MG PO TABS: 50.0000 mg | ORAL_TABLET | Freq: Every day | ORAL | 1 refills | Status: DC

## 2024-02-04 MED ORDER — QUETIAPINE FUMARATE 25 MG PO TABS: 25.0000 mg | ORAL_TABLET | Freq: Every day | ORAL | 2 refills | Status: DC

## 2024-02-04 MED ORDER — SERTRALINE HCL 50 MG PO TABS: 50.0000 mg | ORAL_TABLET | Freq: Every day | ORAL | 2 refills | Status: DC

## 2024-02-04 NOTE — Progress Notes (Signed)
 US :   GA = 19 weeks Single active female fetus, cephalic, FHR = 147 bpm, MVP = 4.2cm, anterior pl EFW 24%, 248g,  anatomy screen completed, no apparent fetal abn seen Nl ov's, neg adnexal regions, neg CDS - no free fluid present,  CL = 3.5 cm,  closed

## 2024-02-04 NOTE — Patient Instructions (Addendum)
 Thank you for attending your appointment today.  -- RESTART Zoloft 50 mg daily -- START gabapentin 100 mg twice daily as needed for acute anxiety  -- Here is more information about this medication in pregnancy: [email or web address] -- Continue other medications as prescribed.  Please do not make any changes to medications without first discussing with your provider. If you are experiencing a psychiatric emergency, please call 911 or present to your nearest emergency department. Additional crisis, medication management, and therapy resources are included below.  [facility]  [address], [place], [place] [address] [phone] WALK-IN URGENT CARE 24/7 FOR ANYONE [address] [place], [place]  [phone] Fax: [phone] [email or web address] *Interpreters available *Accepts all insurance and uninsured for Urgent Care needs *Accepts Medicaid and uninsured for outpatient treatment (below)      ONLY FOR [facility]  Below:    Outpatient New Patient Assessment/Therapy Walk-ins:        Monday, Wednesday, and Thursday 8am until slots are full (first come, first served)                   New Patient Psychiatry/Medication Management        Monday-Friday 8am-11am (first come, first served)               For all walk-ins we ask that you arrive by 7:15am, because patients will be seen in the order of arrival.

## 2024-02-04 NOTE — Progress Notes (Signed)
 LOW-RISK PREGNANCY VISIT Patient name: Victoria [name] MRN [id number]  Date of birth: 1991/05/01 Chief Complaint:   Routine Prenatal Visit (US  today!!! )  History of Present Illness:   Victoria [name] is a 33 y.o. G1P0 female at [redacted]w[redacted]d with an Estimated Date of Delivery: 06/30/24 being seen today for ongoing management of a low-risk pregnancy.   Today she reports  breasts have grown a lot, inner parts of both w/ some swelling . Contractions: Not present. Vag. Bleeding: None.  Movement: Present. denies leaking of fluid.     12/24/2023   10:11 AM 12/05/2023    9:01 AM 11/26/2023    2:30 PM 05/08/2023    2:58 PM 05/01/2023    8:24 AM  Depression screen PHQ 2/9  Decreased Interest 0      Down, Depressed, Hopeless 0      PHQ - 2 Score 0      Altered sleeping 2      Tired, decreased energy 2      Change in appetite 2      Feeling bad or failure about yourself  0      Trouble concentrating 0      Moving slowly or fidgety/restless 0      Suicidal thoughts 0      PHQ-9 Score 6      Difficult doing work/chores          Information is confidential and restricted. Go to Review Flowsheets to unlock data.        12/24/2023   10:12 AM 11/26/2023    2:32 PM 05/01/2023    8:23 AM  GAD 7 : Generalized Anxiety Score  Nervous, Anxious, on Edge 1    Control/stop worrying 0    Worry too much - different things 0    Trouble relaxing 0    Restless 0    Easily annoyed or irritable 0    Afraid - awful might happen 0    Total GAD 7 Score 1    Anxiety Difficulty        Information is confidential and restricted. Go to Review Flowsheets to unlock data.      Review of Systems:   Pertinent items are noted in HPI Denies abnormal vaginal discharge w/ itching/odor/irritation, headaches, visual changes, shortness of breath, chest pain, abdominal pain, severe nausea/vomiting, or problems with urination or bowel movements unless otherwise stated above. Pertinent History Reviewed:  Reviewed past  medical,surgical, social, obstetrical and family history.  Reviewed problem list, medications and allergies. Physical Assessment:   Vitals:   02/04/24 0958  BP: 136/74  Pulse: 97  Weight: 169 lb (76.7 kg)  Body mass index is 29.01 kg/m.        Physical Examination:   General appearance: Well appearing, and in no distress  Mental status: Alert, oriented to person, place, and time  Skin: Warm & dry  Cardiovascular: Normal heart rate noted  Respiratory: Normal respiratory effort, no distress  Abdomen: Soft, gravid, nontender  Pelvic: Cervical exam deferred         Extremities: Edema: None  Fetal Status:     Movement: Present   US :   GA = 19 weeks Single active female fetus, cephalic, FHR = 147 bpm, MVP = 4.2cm, anterior pl EFW 24%, 248g,  anatomy screen completed, no apparent fetal abn seen Nl ov's, neg adnexal regions, neg CDS - no free fluid present,  CL = 3.5 cm,  closed    Chaperone: N/A No  results found for this or any previous visit (from the past 24 hours).  Assessment & Plan:  1) Low-risk pregnancy G1P0 at [redacted]w[redacted]d with an Estimated Date of Delivery: 06/30/24   2) Schizoaffective disorder/PTSD/Anx/Dep, doing well on meds, sees [facility] for therapy   Meds: No orders of the defined types were placed in this encounter.  Labs/procedures today: U/S  Plan:  Continue routine obstetrical care  Next visit: prefers in person    Reviewed: Preterm labor symptoms and general obstetric precautions including but not limited to vaginal bleeding, contractions, leaking of fluid and fetal movement were reviewed in detail with the patient.  All questions were answered. Does have home bp cuff. Office bp cuff given: not applicable. Check bp weekly, let us  know if consistently >140 and/or >90.  Follow-up: Return in about 4 weeks (around 03/03/2024) for LROB, CNM, in person.  Future Appointments  Date Time Provider Department Center  02/04/2024  1:00 PM Adell Hones [facility] None  03/03/2024   9:10 AM Ferd Householder, CNM [facility]    No orders of the defined types were placed in this encounter.  Ferd Householder CNM, [facility] 02/04/2024 10:30 AM

## 2024-02-04 NOTE — Patient Instructions (Signed)
 [email or web address], thank you for choosing our office today! We appreciate the opportunity to meet your healthcare needs. You may receive a short survey by mail, e-mail, or through [employer]. If you are happy with your care we would appreciate if you could take just a few minutes to complete the survey questions. We read all of your comments and take your feedback very seriously. Thank you again for choosing our office.  Center for [employer] Team at [facility] at [facility] ([address] [place], [place] [address]) Entrance C, located off of [address] Free 24/7 valet parking  Go to [email or web address] to register for FREE online childbirth classes  Call the office [phone]) or go to [facility] if: You begin to severe cramping Your water breaks.  Sometimes it is a big gush of fluid, sometimes it is just a trickle that keeps getting your panties wet or running down your legs You have vaginal bleeding.  It is normal to have a small amount of spotting if your cervix was checked.   [facility] Pediatricians/Family Doctors [facility]): [address] Colette Ribas, [phone]           [facility]: [address], [phone]                [facility]): [address], [phone] (call to ask if accepting patients) [facility] Department: [address], [place], [phone]    [facility] Pediatricians/Family Doctors [facility]): [phone] [address], [phone] [facility]: [address] [place], [phone] [facility]: [address], [phone]  [facility] Doctors  [facility]): [phone] [facility]: [address] [phone]   [facility] Doctors [facility]: [address], [phone]  [facility] Doctors  [facility]: [phone], [phone]  Home Blood Pressure Monitoring for Patients   Your provider has recommended that you check your blood pressure (BP) at least once a week at home. If you do not have a blood pressure cuff at home, one will be provided for you. Contact your provider if you have not received your monitor within 1 week.   Helpful Tips for Accurate Home Blood Pressure Checks  Don't smoke, exercise, or drink caffeine 30 minutes before checking your BP Use the restroom before checking your BP (a full bladder can raise your pressure) Relax in a comfortable upright chair Feet on the ground Left arm resting comfortably on a flat surface at the level of your heart Legs uncrossed Back supported Sit quietly and don't talk Place the cuff on your bare arm Adjust snuggly, so that only two fingertips can fit between your skin and the top of the cuff Check 2 readings separated by at least one minute Keep a log of your BP readings For a visual, please reference this diagram: http://ccnc.care/bpdiagram  Provider Name: [facility]     Phone: [phone]  Zone 1: ALL CLEAR  Continue to monitor your symptoms:  BP reading is less than 140 (top number) or less than 90 (bottom number)  No right upper stomach pain No headaches or seeing spots No feeling nauseated or throwing up No swelling in face and hands  Zone 2: CAUTION Call your doctor's office for any of the following:  BP reading is greater than 140 (top number) or greater than  90 (bottom number)  Stomach pain under your ribs in the middle or right side Headaches or seeing spots Feeling nauseated or throwing up Swelling in face and hands  Zone 3: EMERGENCY  Seek immediate medical care if you have any of the following:  BP reading is greater than160 (top number) or greater than 110 (bottom number) Severe headaches not improving with Tylenol Serious difficulty catching your breath Any worsening symptoms from  Zone 2     Second Trimester of Pregnancy The second trimester is from week 14 through week 27 (months 4 through 6). The second trimester is often a time when you feel your best. Your body has adjusted to being pregnant, and you begin to feel better physically. Usually, morning sickness has lessened or quit completely, you may have more energy, and you may have an increase in appetite. The second trimester is also a time when the fetus is growing rapidly. At the end of the sixth month, the fetus is about 9 inches long and weighs about 1 pounds. You will likely begin to feel the baby move (quickening) between 16 and 20 weeks of pregnancy. Body changes during your second trimester Your body continues to go through many changes during your second trimester. The changes vary from woman to woman. Your weight will continue to increase. You will notice your lower abdomen bulging out. You may begin to get stretch marks on your hips, abdomen, and breasts. You may develop headaches that can be relieved by medicines. The medicines should be approved by your health care provider. You may urinate more often because the fetus is pressing on your bladder. You may develop or continue to have heartburn as a result of your pregnancy. You may develop constipation because certain hormones are causing the muscles that push waste through your intestines to slow down. You may develop hemorrhoids or swollen, bulging veins (varicose veins). You may have back pain. This is caused by: Weight gain. Pregnancy hormones that are relaxing the joints in your pelvis. A shift in weight and the muscles that support your balance. Your breasts will continue to grow and they will continue to become tender. Your gums may bleed and may be sensitive to brushing and flossing. Dark spots or blotches (chloasma, mask of pregnancy) may develop on your face. This will likely fade after the baby is born. A dark line from your belly button to  the pubic area (linea nigra) may appear. This will likely fade after the baby is born. You may have changes in your hair. These can include thickening of your hair, rapid growth, and changes in texture. Some women also have hair loss during or after pregnancy, or hair that feels dry or thin. Your hair will most likely return to normal after your baby is born.  What to expect at prenatal visits During a routine prenatal visit: You will be weighed to make sure you and the fetus are growing normally. Your blood pressure will be taken. Your abdomen will be measured to track your baby's growth. The fetal heartbeat will be listened to. Any test results from the previous visit will be discussed.  Your health care provider may ask you: How you are feeling. If you are feeling the baby move. If you have had any abnormal symptoms, such as leaking fluid, bleeding, severe headaches, or abdominal cramping. If you are using any tobacco products, including cigarettes, chewing tobacco, and electronic cigarettes. If you have any questions.  Other tests that may be performed during  your second trimester include: Blood tests that check for: Low iron levels (anemia). High blood sugar that affects pregnant women (gestational diabetes) between 53 and 28 weeks. Rh antibodies. This is to check for a protein on red blood cells (Rh factor). Urine tests to check for infections, diabetes, or protein in the urine. An ultrasound to confirm the proper growth and development of the baby. An amniocentesis to check for possible genetic problems. Fetal screens for spina bifida and Down syndrome. HIV (human immunodeficiency virus) testing. Routine prenatal testing includes screening for HIV, unless you choose not to have this test.  Follow these instructions at home: Medicines Follow your health care provider's instructions regarding medicine use. Specific medicines may be either safe or unsafe to take during  pregnancy. Take a prenatal vitamin that contains at least 600 micrograms (mcg) of folic acid. If you develop constipation, try taking a stool softener if your health care provider approves. Eating and drinking Eat a balanced diet that includes fresh fruits and vegetables, whole grains, good sources of protein such as meat, eggs, or tofu, and low-fat dairy. Your health care provider will help you determine the amount of weight gain that is right for you. Avoid raw meat and uncooked cheese. These carry germs that can cause birth defects in the baby. If you have low calcium intake from food, talk to your health care provider about whether you should take a daily calcium supplement. Limit foods that are high in fat and processed sugars, such as fried and sweet foods. To prevent constipation: Drink enough fluid to keep your urine clear or pale yellow. Eat foods that are high in fiber, such as fresh fruits and vegetables, whole grains, and beans. Activity Exercise only as directed by your health care provider. Most women can continue their usual exercise routine during pregnancy. Try to exercise for 30 minutes at least 5 days a week. Stop exercising if you experience uterine contractions. Avoid heavy lifting, wear low heel shoes, and practice good posture. A sexual relationship may be continued unless your health care provider directs you otherwise. Relieving pain and discomfort Wear a good support bra to prevent discomfort from breast tenderness. Take warm sitz baths to soothe any pain or discomfort caused by hemorrhoids. Use hemorrhoid cream if your health care provider approves. Rest with your legs elevated if you have leg cramps or low back pain. If you develop varicose veins, wear support hose. Elevate your feet for 15 minutes, 3-4 times a day. Limit salt in your diet. Prenatal Care Write down your questions. Take them to your prenatal visits. Keep all your prenatal visits as told by your health  care provider. This is important. Safety Wear your seat belt at all times when driving. Make a list of emergency phone numbers, including numbers for family, friends, the hospital, and police and fire departments. General instructions Ask your health care provider for a referral to a local prenatal education class. Begin classes no later than the beginning of month 6 of your pregnancy. Ask for help if you have counseling or nutritional needs during pregnancy. Your health care provider can offer advice or refer you to specialists for help with various needs. Do not use hot tubs, steam rooms, or saunas. Do not douche or use tampons or scented sanitary pads. Do not cross your legs for long periods of time. Avoid cat litter boxes and soil used by cats. These carry germs that can cause birth defects in the baby and possibly loss of the  fetus by miscarriage or stillbirth. Avoid all smoking, herbs, alcohol, and unprescribed drugs. Chemicals in these products can affect the formation and growth of the baby. Do not use any products that contain nicotine or tobacco, such as cigarettes and e-cigarettes. If you need help quitting, ask your health care provider. Visit your dentist if you have not gone yet during your pregnancy. Use a soft toothbrush to brush your teeth and be gentle when you floss. Contact a health care provider if: You have dizziness. You have mild pelvic cramps, pelvic pressure, or nagging pain in the abdominal area. You have persistent nausea, vomiting, or diarrhea. You have a bad smelling vaginal discharge. You have pain when you urinate. Get help right away if: You have a fever. You are leaking fluid from your vagina. You have spotting or bleeding from your vagina. You have severe abdominal cramping or pain. You have rapid weight gain or weight loss. You have shortness of breath with chest pain. You notice sudden or extreme swelling of your face, hands, ankles, feet, or legs. You  have not felt your baby move in over an hour. You have severe headaches that do not go away when you take medicine. You have vision changes. Summary The second trimester is from week 14 through week 27 (months 4 through 6). It is also a time when the fetus is growing rapidly. Your body goes through many changes during pregnancy. The changes vary from woman to woman. Avoid all smoking, herbs, alcohol, and unprescribed drugs. These chemicals affect the formation and growth your baby. Do not use any tobacco products, such as cigarettes, chewing tobacco, and e-cigarettes. If you need help quitting, ask your health care provider. Contact your health care provider if you have any questions. Keep all prenatal visits as told by your health care provider. This is important. This information is not intended to replace advice given to you by your health care provider. Make sure you discuss any questions you have with your health care provider. Document Released: 10/02/2001 Document Revised: 03/15/2016 Document Reviewed: 12/09/2012 Elsevier Interactive Patient Education  2017 [employer].

## 2024-02-05 ENCOUNTER — Telehealth (HOSPITAL_COMMUNITY): Payer: Self-pay | Admitting: Licensed Clinical Social Worker

## 2024-02-05 DIAGNOSIS — F1221 Cannabis dependence, in remission: Secondary | ICD-10-CM | POA: Insufficient documentation

## 2024-02-10 ENCOUNTER — Other Ambulatory Visit: Payer: Self-pay

## 2024-02-10 ENCOUNTER — Encounter (HOSPITAL_COMMUNITY): Payer: Self-pay | Admitting: Emergency Medicine

## 2024-02-10 ENCOUNTER — Emergency Department (HOSPITAL_COMMUNITY)
Admission: EM | Admit: 2024-02-10 | Discharge: 2024-02-10 | Disposition: A | Discharge status: Home / Self Care | Payer: MEDICAID | Attending: Emergency Medicine | Admitting: Emergency Medicine

## 2024-02-10 DIAGNOSIS — R109 Unspecified abdominal pain: Secondary | ICD-10-CM | POA: Diagnosis not present

## 2024-02-10 DIAGNOSIS — O26892 Other specified pregnancy related conditions, second trimester: Secondary | ICD-10-CM | POA: Diagnosis present

## 2024-02-10 DIAGNOSIS — Z7982 Long term (current) use of aspirin: Secondary | ICD-10-CM | POA: Insufficient documentation

## 2024-02-10 NOTE — ED Triage Notes (Signed)
 Pt presents for elevation of no fetal movement, per pt she is [redacted] weeks pregnant and hasn't felt the baby move, since yesterday, seen at [facility] for [facility] and they told her to come here for US .

## 2024-02-10 NOTE — Discharge Instructions (Signed)
 Today's evaluation has been reassuring, but if you develop new, or concerning changes do not hesitate to return here or follow-up with your obstetrics team.

## 2024-02-10 NOTE — ED Provider Notes (Signed)
 [place] EMERGENC[facility] DEPARTMENT AT [facility] Provider Note   CSN: [id number] Arrival date & time: 02/10/24  7829     History  Chief Complaint  Patient presents with   Pregnancy Problem    Victoria [name] is a 33 y.o. female.  HPI Patient presents with abdominal discomfort, lack of send sensation of fetal motion.  Patient is G1, P0 at 20 weeks.  Last ultrasound within the past week, unremarkable, results below.  With past 2 days patient has had some minimal lower abdominal discomfort, and feels as though she has not felt the baby move.  She called her obstetrics office and was sent to the ED for evaluation.  No other new complaints including fever, persistent abdominal pain, nausea.    Home Medications Prior to Admission medications   Medication Sig Start Date End Date Taking? Authorizing Provider  aspirin  EC 81 MG tablet Take 1 tablet (81 mg total) by mouth daily. Swallow whole. 12/24/23   Ferd Householder, CNM  gabapentin  (NEURONTIN ) 100 MG capsule Take 1 capsule (100 mg total) by mouth 2 (two) times daily as needed (acute anxiety). 02/04/24 04/04/24  Bahraini, Sarah A  Prenatal Vit-Fe Fumarate-FA (M-NATAL PLUS) 27-1 MG TABS Take 1 tablet by mouth daily. 01/24/24   [provider]  Prenatal Vit-Fe Fumarate-FA (MULTIVITAMIN-PRENATAL) 27-0.8 MG TABS tablet Take 1 tablet by mouth daily at 12 noon. 01/22/24   Jolayne Natter, CNM  promethazine  (PHENERGAN ) 25 MG tablet Take 0.5-1 tablets (12.5-25 mg total) by mouth every 6 (six) hours as needed. 12/24/23   Ferd Householder, CNM  QUEtiapine  (SEROQUEL ) 25 MG tablet Take 1 tablet (25 mg total) by mouth at bedtime. 02/04/24 05/04/24  Bahraini, Sarah A  sertraline  (ZOLOFT ) 50 MG tablet Take 1 tablet (50 mg total) by mouth daily. 02/04/24 05/04/24  Bahraini, Sarah A  traZODone  (DESYREL ) 50 MG tablet Take 1 tablet (50 mg total) by mouth at bedtime. 02/04/24   Bahraini, Sarah A  rizatriptan (MAXALT-MLT) 5 MG disintegrating tablet  Take 5 mg by mouth daily as needed for migraine. 07/15/18 06/05/19  [provider]      Allergies    Lurasidone  hcl    Review of Systems   Review of Systems  Physical Exam Updated Vital Signs BP 114/81   Pulse 89   Temp 98.1 F (36.7 C) (Oral)   Resp 16   Ht 5\' 4"  (1.626 m)   Wt 76.7 kg   LMP 08/29/2023   SpO2 100%   BMI 29.01 kg/m  Physical Exam Vitals and nursing note reviewed.  Constitutional:      General: She is not in acute distress.    Appearance: She is well-developed.  HENT:     Head: Normocephalic and atraumatic.  Eyes:     Conjunctiva/sclera: Conjunctivae normal.  Cardiovascular:     Rate and Rhythm: Normal rate and regular rhythm.  Pulmonary:     Effort: Pulmonary effort is normal. No respiratory distress.     Breath sounds: Normal breath sounds. No stridor.  Abdominal:     Tenderness: There is no abdominal tenderness. There is no guarding.     Comments: Gravid abdomen, nontender  Skin:    General: Skin is warm and dry.  Neurological:     Mental Status: She is alert and oriented to person, place, and time.     Cranial Nerves: No cranial nerve deficit.  Psychiatric:        Mood and Affect: Mood normal.  ED Results / Procedures / Treatments   Labs (all labs ordered are listed, but only abnormal results are displayed) Labs Reviewed - No data to display  EKG None  Radiology No results found.  Procedures Procedures    Medications Ordered in ED Medications - No data to display  ED Course/ Medical Decision Making/ [name]                                 Medical Decision Making Adult female, G1, P0 at 20 weeks, with unremarkable ultrasound last week as below presents with concern for abdominal pain, decreased sensation of fetal motion.  Differential includes abdominal pain in pregnancy, infection, fetal demise less likely given patient's reassuring vitals, absence of abdominal pain.  Chart reviewed, including ultrasound which is  included below from last week. Fetal heart tones ordered after physical exam, these were reassuring with fetal heart tones 155.  Without other complaints, with no hemodynamic instability, patient will follow-up closely with her obstetrics team.  Amount and/or Complexity of Data Reviewed External Data Reviewed: notes.    Details: As below  Ultrasound TECHNICIAN COMMENTS:   US :   GA = 19 weeks Single active female fetus, cephalic, FHR = 147 bpm, MVP = 4.2cm, anterior pl EFW 24%, 248g,  anatomy screen completed, no apparent fetal abn seen Nl ov's, neg adnexal regions, neg CDS - no free fluid present,  CL = 3.5 cm,  closed  Final Clinical Impression(s) / ED Diagnoses Final diagnoses:  Abdominal pain during pregnancy in second trimester    Rx / DC Orders ED Discharge Orders     None         Dorenda Gandy, MD 02/10/24 1128

## 2024-02-11 ENCOUNTER — Ambulatory Visit (HOSPITAL_COMMUNITY)

## 2024-02-11 ENCOUNTER — Telehealth (HOSPITAL_COMMUNITY): Payer: Self-pay | Admitting: Professional

## 2024-02-11 ENCOUNTER — Encounter (HOSPITAL_COMMUNITY): Payer: Self-pay

## 2024-02-19 ENCOUNTER — Encounter: Payer: Self-pay | Admitting: Women's Health

## 2024-02-19 ENCOUNTER — Ambulatory Visit (INDEPENDENT_AMBULATORY_CARE_PROVIDER_SITE_OTHER): Payer: MEDICAID | Admitting: Women's Health

## 2024-02-19 ENCOUNTER — Other Ambulatory Visit (HOSPITAL_COMMUNITY)
Admission: RE | Admit: 2024-02-19 | Discharge: 2024-02-19 | Disposition: A | Discharge status: Home / Self Care | Payer: MEDICAID | Source: Ambulatory Visit | Attending: Women's Health | Admitting: Women's Health

## 2024-02-19 VITALS — BP 99/63 | HR 114 | Ht 64.0 in | Wt 171.0 lb

## 2024-02-19 DIAGNOSIS — Z3A21 21 weeks gestation of pregnancy: Secondary | ICD-10-CM

## 2024-02-19 DIAGNOSIS — N898 Other specified noninflammatory disorders of vagina: Secondary | ICD-10-CM

## 2024-02-19 DIAGNOSIS — O26892 Other specified pregnancy related conditions, second trimester: Secondary | ICD-10-CM

## 2024-02-19 MED ORDER — METRONIDAZOLE 500 MG PO TABS: 500.0000 mg | ORAL_TABLET | Freq: Two times a day (BID) | ORAL | 0 refills | Status: DC

## 2024-02-19 NOTE — Progress Notes (Signed)
 GYN VISIT Patient name: Victoria [name] MRN [id number]  Date of birth: 11-23-1990 Chief Complaint:   Vaginal Pain (Vaginal odor, post intercourse)  History of Present Illness:   Victoria [name] is a 33 y.o. G1P0 African-American female at [redacted]w[redacted]d being seen today for report of fishy vaginal odor after intercourse, has h/o BV. No itching/irritation, abnormal d/c. Good fm    Patient's last menstrual period was 08/29/2023.     12/24/2023   10:11 AM 12/05/2023    9:01 AM 11/26/2023    2:30 PM 05/08/2023    2:58 PM 05/01/2023    8:24 AM  Depression screen PHQ 2/9  Decreased Interest 0      Down, Depressed, Hopeless 0      PHQ - 2 Score 0      Altered sleeping 2      Tired, decreased energy 2      Change in appetite 2      Feeling bad or failure about yourself  0      Trouble concentrating 0      Moving slowly or fidgety/restless 0      Suicidal thoughts 0      PHQ-9 Score 6      Difficult doing work/chores          Information is confidential and restricted. Go to Review Flowsheets to unlock data.        12/24/2023   10:12 AM 11/26/2023    2:32 PM 05/01/2023    8:23 AM  GAD 7 : Generalized Anxiety Score  Nervous, Anxious, on Edge 1    Control/stop worrying 0    Worry too much - different things 0    Trouble relaxing 0    Restless 0    Easily annoyed or irritable 0    Afraid - awful might happen 0    Total GAD 7 Score 1    Anxiety Difficulty        Information is confidential and restricted. Go to Review Flowsheets to unlock data.     Review of Systems:   Pertinent items are noted in HPI Denies fever/chills, dizziness, headaches, visual disturbances, fatigue, shortness of breath, chest pain, abdominal pain, vomiting, abnormal vaginal discharge/itching/odor/irritation, problems with periods, bowel movements, urination, or intercourse unless otherwise stated above.  Pertinent History Reviewed:  Reviewed past medical,surgical, social, obstetrical and family history.   Reviewed problem list, medications and allergies. Physical Assessment:   Vitals:   02/19/24 1145  BP: 99/63  Pulse: (!) 114  Weight: 171 lb (77.6 kg)  Height: 5\' 4"  (1.626 m)  Body mass index is 29.35 kg/m.       Physical Examination:   General appearance: alert, well appearing, and in no distress  Mental status: alert, oriented to person, place, and time  Skin: warm & dry   Cardiovascular: normal heart rate noted  Respiratory: normal respiratory effort, no distress  Abdomen: soft, non-tender   Pelvic: VULVA: normal appearing vulva with no masses, tenderness or lesions, VAGINA: normal appearing vagina with normal color and discharge, no lesions, CERVIX: normal appearing cervix without discharge or lesions  Extremities: no edema   Chaperone: Steffanie Edouard  FHR via doppler: 155  No results found for this or any previous visit (from the past 24 hours).  Assessment & Plan:  1) Vaginal odor> CV swab, wants rx for flagyl  just in case + and comes back over weekend, discussed not to pick up/take unless CV swab+ for BV  2) [redacted]wks pregnant> +  FHR  Meds:  Meds ordered this encounter  Medications   metroNIDAZOLE  (FLAGYL ) 500 MG tablet    Sig: Take 1 tablet (500 mg total) by mouth 2 (two) times daily.    Dispense:  14 tablet    Refill:  0    No orders of the defined types were placed in this encounter.   Return for As scheduled.  Ferd Householder CNM, [facility] 02/19/2024 12:10 PM

## 2024-02-19 NOTE — Patient Instructions (Signed)
 [email or web address], thank you for choosing our office today! We appreciate the opportunity to meet your healthcare needs. You may receive a short survey by mail, e-mail, or through [employer]. If you are happy with your care we would appreciate if you could take just a few minutes to complete the survey questions. We read all of your comments and take your feedback very seriously. Thank you again for choosing our office.  Center for [employer] Team at [facility] at [facility] ([address] [place], [place] [address]) Entrance C, located off of [address] Free 24/7 valet parking  Go to [email or web address] to register for FREE online childbirth classes  Call the office [phone]) or go to [facility] if: You begin to severe cramping Your water breaks.  Sometimes it is a big gush of fluid, sometimes it is just a trickle that keeps getting your panties wet or running down your legs You have vaginal bleeding.  It is normal to have a small amount of spotting if your cervix was checked.   [facility] Pediatricians/Family Doctors [facility]): [address] Colette Ribas, [phone]           [facility]: [address], [phone]                [facility]): [address], [phone] (call to ask if accepting patients) [facility] Department: [address], [place], [phone]    [facility] Pediatricians/Family Doctors [facility]): [phone] [address], [phone] [facility]: [address] [place], [phone] [facility]: [address], [phone]  [facility] Doctors  [facility]): [phone] [facility]: [address] [phone]   [facility] Doctors [facility]: [address], [phone]  [facility] Doctors  [facility]: [phone], [phone]  Home Blood Pressure Monitoring for Patients   Your provider has recommended that you check your blood pressure (BP) at least once a week at home. If you do not have a blood pressure cuff at home, one will be provided for you. Contact your provider if you have not received your monitor within 1 week.   Helpful Tips for Accurate Home Blood Pressure Checks  Don't smoke, exercise, or drink caffeine 30 minutes before checking your BP Use the restroom before checking your BP (a full bladder can raise your pressure) Relax in a comfortable upright chair Feet on the ground Left arm resting comfortably on a flat surface at the level of your heart Legs uncrossed Back supported Sit quietly and don't talk Place the cuff on your bare arm Adjust snuggly, so that only two fingertips can fit between your skin and the top of the cuff Check 2 readings separated by at least one minute Keep a log of your BP readings For a visual, please reference this diagram: http://ccnc.care/bpdiagram  Provider Name: [facility]     Phone: [phone]  Zone 1: ALL CLEAR  Continue to monitor your symptoms:  BP reading is less than 140 (top number) or less than 90 (bottom number)  No right upper stomach pain No headaches or seeing spots No feeling nauseated or throwing up No swelling in face and hands  Zone 2: CAUTION Call your doctor's office for any of the following:  BP reading is greater than 140 (top number) or greater than  90 (bottom number)  Stomach pain under your ribs in the middle or right side Headaches or seeing spots Feeling nauseated or throwing up Swelling in face and hands  Zone 3: EMERGENCY  Seek immediate medical care if you have any of the following:  BP reading is greater than160 (top number) or greater than 110 (bottom number) Severe headaches not improving with Tylenol Serious difficulty catching your breath Any worsening symptoms from  Zone 2     Second Trimester of Pregnancy The second trimester is from week 14 through week 27 (months 4 through 6). The second trimester is often a time when you feel your best. Your body has adjusted to being pregnant, and you begin to feel better physically. Usually, morning sickness has lessened or quit completely, you may have more energy, and you may have an increase in appetite. The second trimester is also a time when the fetus is growing rapidly. At the end of the sixth month, the fetus is about 9 inches long and weighs about 1 pounds. You will likely begin to feel the baby move (quickening) between 16 and 20 weeks of pregnancy. Body changes during your second trimester Your body continues to go through many changes during your second trimester. The changes vary from woman to woman. Your weight will continue to increase. You will notice your lower abdomen bulging out. You may begin to get stretch marks on your hips, abdomen, and breasts. You may develop headaches that can be relieved by medicines. The medicines should be approved by your health care provider. You may urinate more often because the fetus is pressing on your bladder. You may develop or continue to have heartburn as a result of your pregnancy. You may develop constipation because certain hormones are causing the muscles that push waste through your intestines to slow down. You may develop hemorrhoids or swollen, bulging veins (varicose veins). You may have back pain. This is caused by: Weight gain. Pregnancy hormones that are relaxing the joints in your pelvis. A shift in weight and the muscles that support your balance. Your breasts will continue to grow and they will continue to become tender. Your gums may bleed and may be sensitive to brushing and flossing. Dark spots or blotches (chloasma, mask of pregnancy) may develop on your face. This will likely fade after the baby is born. A dark line from your belly button to  the pubic area (linea nigra) may appear. This will likely fade after the baby is born. You may have changes in your hair. These can include thickening of your hair, rapid growth, and changes in texture. Some women also have hair loss during or after pregnancy, or hair that feels dry or thin. Your hair will most likely return to normal after your baby is born.  What to expect at prenatal visits During a routine prenatal visit: You will be weighed to make sure you and the fetus are growing normally. Your blood pressure will be taken. Your abdomen will be measured to track your baby's growth. The fetal heartbeat will be listened to. Any test results from the previous visit will be discussed.  Your health care provider may ask you: How you are feeling. If you are feeling the baby move. If you have had any abnormal symptoms, such as leaking fluid, bleeding, severe headaches, or abdominal cramping. If you are using any tobacco products, including cigarettes, chewing tobacco, and electronic cigarettes. If you have any questions.  Other tests that may be performed during  your second trimester include: Blood tests that check for: Low iron levels (anemia). High blood sugar that affects pregnant women (gestational diabetes) between 53 and 28 weeks. Rh antibodies. This is to check for a protein on red blood cells (Rh factor). Urine tests to check for infections, diabetes, or protein in the urine. An ultrasound to confirm the proper growth and development of the baby. An amniocentesis to check for possible genetic problems. Fetal screens for spina bifida and Down syndrome. HIV (human immunodeficiency virus) testing. Routine prenatal testing includes screening for HIV, unless you choose not to have this test.  Follow these instructions at home: Medicines Follow your health care provider's instructions regarding medicine use. Specific medicines may be either safe or unsafe to take during  pregnancy. Take a prenatal vitamin that contains at least 600 micrograms (mcg) of folic acid. If you develop constipation, try taking a stool softener if your health care provider approves. Eating and drinking Eat a balanced diet that includes fresh fruits and vegetables, whole grains, good sources of protein such as meat, eggs, or tofu, and low-fat dairy. Your health care provider will help you determine the amount of weight gain that is right for you. Avoid raw meat and uncooked cheese. These carry germs that can cause birth defects in the baby. If you have low calcium intake from food, talk to your health care provider about whether you should take a daily calcium supplement. Limit foods that are high in fat and processed sugars, such as fried and sweet foods. To prevent constipation: Drink enough fluid to keep your urine clear or pale yellow. Eat foods that are high in fiber, such as fresh fruits and vegetables, whole grains, and beans. Activity Exercise only as directed by your health care provider. Most women can continue their usual exercise routine during pregnancy. Try to exercise for 30 minutes at least 5 days a week. Stop exercising if you experience uterine contractions. Avoid heavy lifting, wear low heel shoes, and practice good posture. A sexual relationship may be continued unless your health care provider directs you otherwise. Relieving pain and discomfort Wear a good support bra to prevent discomfort from breast tenderness. Take warm sitz baths to soothe any pain or discomfort caused by hemorrhoids. Use hemorrhoid cream if your health care provider approves. Rest with your legs elevated if you have leg cramps or low back pain. If you develop varicose veins, wear support hose. Elevate your feet for 15 minutes, 3-4 times a day. Limit salt in your diet. Prenatal Care Write down your questions. Take them to your prenatal visits. Keep all your prenatal visits as told by your health  care provider. This is important. Safety Wear your seat belt at all times when driving. Make a list of emergency phone numbers, including numbers for family, friends, the hospital, and police and fire departments. General instructions Ask your health care provider for a referral to a local prenatal education class. Begin classes no later than the beginning of month 6 of your pregnancy. Ask for help if you have counseling or nutritional needs during pregnancy. Your health care provider can offer advice or refer you to specialists for help with various needs. Do not use hot tubs, steam rooms, or saunas. Do not douche or use tampons or scented sanitary pads. Do not cross your legs for long periods of time. Avoid cat litter boxes and soil used by cats. These carry germs that can cause birth defects in the baby and possibly loss of the  fetus by miscarriage or stillbirth. Avoid all smoking, herbs, alcohol, and unprescribed drugs. Chemicals in these products can affect the formation and growth of the baby. Do not use any products that contain nicotine or tobacco, such as cigarettes and e-cigarettes. If you need help quitting, ask your health care provider. Visit your dentist if you have not gone yet during your pregnancy. Use a soft toothbrush to brush your teeth and be gentle when you floss. Contact a health care provider if: You have dizziness. You have mild pelvic cramps, pelvic pressure, or nagging pain in the abdominal area. You have persistent nausea, vomiting, or diarrhea. You have a bad smelling vaginal discharge. You have pain when you urinate. Get help right away if: You have a fever. You are leaking fluid from your vagina. You have spotting or bleeding from your vagina. You have severe abdominal cramping or pain. You have rapid weight gain or weight loss. You have shortness of breath with chest pain. You notice sudden or extreme swelling of your face, hands, ankles, feet, or legs. You  have not felt your baby move in over an hour. You have severe headaches that do not go away when you take medicine. You have vision changes. Summary The second trimester is from week 14 through week 27 (months 4 through 6). It is also a time when the fetus is growing rapidly. Your body goes through many changes during pregnancy. The changes vary from woman to woman. Avoid all smoking, herbs, alcohol, and unprescribed drugs. These chemicals affect the formation and growth your baby. Do not use any tobacco products, such as cigarettes, chewing tobacco, and e-cigarettes. If you need help quitting, ask your health care provider. Contact your health care provider if you have any questions. Keep all prenatal visits as told by your health care provider. This is important. This information is not intended to replace advice given to you by your health care provider. Make sure you discuss any questions you have with your health care provider. Document Released: 10/02/2001 Document Revised: 03/15/2016 Document Reviewed: 12/09/2012 Elsevier Interactive Patient Education  2017 [employer].

## 2024-02-20 ENCOUNTER — Encounter: Payer: Self-pay | Admitting: Women's Health

## 2024-02-20 LAB — CERVICOVAGINAL ANCILLARY ONLY
Bacterial Vaginitis (gardnerella): NEGATIVE
Candida Glabrata: NEGATIVE
Candida Vaginitis: NEGATIVE
Chlamydia: NEGATIVE
Comment: NEGATIVE
Comment: NEGATIVE
Comment: NEGATIVE
Comment: NEGATIVE
Comment: NEGATIVE
Comment: NORMAL
Neisseria Gonorrhea: NEGATIVE
Trichomonas: NEGATIVE

## 2024-03-03 ENCOUNTER — Encounter: Payer: MEDICAID | Admitting: Women's Health

## 2024-03-04 ENCOUNTER — Telehealth: Payer: MEDICAID | Admitting: Obstetrics & Gynecology

## 2024-03-04 ENCOUNTER — Encounter: Payer: Self-pay | Admitting: Obstetrics & Gynecology

## 2024-03-04 DIAGNOSIS — O99342 Other mental disorders complicating pregnancy, second trimester: Secondary | ICD-10-CM | POA: Diagnosis not present

## 2024-03-04 DIAGNOSIS — Z8739 Personal history of other diseases of the musculoskeletal system and connective tissue: Secondary | ICD-10-CM

## 2024-03-04 DIAGNOSIS — Z3A23 23 weeks gestation of pregnancy: Secondary | ICD-10-CM

## 2024-03-04 DIAGNOSIS — M549 Dorsalgia, unspecified: Secondary | ICD-10-CM

## 2024-03-04 DIAGNOSIS — F419 Anxiety disorder, unspecified: Secondary | ICD-10-CM | POA: Diagnosis not present

## 2024-03-04 DIAGNOSIS — F4310 Post-traumatic stress disorder, unspecified: Secondary | ICD-10-CM

## 2024-03-04 DIAGNOSIS — O99891 Other specified diseases and conditions complicating pregnancy: Secondary | ICD-10-CM

## 2024-03-04 DIAGNOSIS — O0992 Supervision of high risk pregnancy, unspecified, second trimester: Secondary | ICD-10-CM

## 2024-03-04 DIAGNOSIS — F99 Mental disorder, not otherwise specified: Secondary | ICD-10-CM

## 2024-03-04 DIAGNOSIS — F411 Generalized anxiety disorder: Secondary | ICD-10-CM

## 2024-03-04 MED ORDER — SERTRALINE HCL 100 MG PO TABS: 100.0000 mg | ORAL_TABLET | Freq: Every day | ORAL | 11 refills | Status: DC

## 2024-03-04 MED ORDER — CYCLOBENZAPRINE HCL 10 MG PO TABS: 10.0000 mg | ORAL_TABLET | Freq: Three times a day (TID) | ORAL | 1 refills | Status: DC | PRN

## 2024-03-04 MED ORDER — ASPIRIN 81 MG PO TBEC: 81.0000 mg | DELAYED_RELEASE_TABLET | Freq: Every day | ORAL | 3 refills | Status: DC

## 2024-03-04 NOTE — Progress Notes (Signed)
 TELEHEALTH VIRTUAL OBSTETRICS VISIT ENCOUNTER NOTE Patient name: Victoria [name] MRN [id number]  Date of birth: 08/05/1991  I connected with patient on 03/04/24 at 10:10 AM EDT by video and verified that I am speaking with the correct person using two identifiers. Pt is not currently in our office, she is at home.  The provider is in the office.    I discussed the limitations, risks, security and privacy concerns of performing an evaluation and management service by telephone and the availability of in person appointments. I also discussed with the patient that there may be a patient responsible charge related to this service. The patient expressed understanding and agreed to proceed.  Chief Complaint:   Routine Prenatal Visit  History of Present Illness:   Victoria [name] is a 33 y.o. G1P0 female at [redacted]w[redacted]d with an Estimated Date of Delivery: 06/30/24 being evaluated today for ongoing management of a high-risk pregnancy.    - Schizoaffective disorder/anxiety/depression - History of chronic hypertension, no meds - History of HSV - Scoliosis with Harrington rods  Today she reports worsening depression feels like her medicine is not working as well as it used to. Also notes considerable back pain using a heating pack and Tylenol  and still having significant issues with her low back     12/24/2023   10:11 AM 12/05/2023    9:01 AM 11/26/2023    2:30 PM 05/08/2023    2:58 PM 05/01/2023    8:24 AM  Depression screen PHQ 2/9  Decreased Interest 0      Down, Depressed, Hopeless 0      PHQ - 2 Score 0      Altered sleeping 2      Tired, decreased energy 2      Change in appetite 2      Feeling bad or failure about yourself  0      Trouble concentrating 0      Moving slowly or fidgety/restless 0      Suicidal thoughts 0      PHQ-9 Score 6      Difficult doing work/chores          Information is confidential and restricted. Go to Review Flowsheets to unlock data.    Today she  reports backache. Contractions: Not present. Vag. Bleeding: None.  Movement: Present. denies leaking of fluid. Review of Systems:   Pertinent items are noted in HPI Denies abnormal vaginal discharge w/ itching/odor/irritation, headaches, visual changes, shortness of breath, chest pain, abdominal pain, severe nausea/vomiting, or problems with urination or bowel movements unless otherwise stated above. Pertinent History Reviewed:  Reviewed past medical,surgical, social, obstetrical and family history.  Reviewed problem list, medications and allergies. Physical Assessment:  There were no vitals filed for this visit.There is no height or weight on file to calculate BMI.        Physical Examination:   General:  Alert, oriented and cooperative.   Mental Status: Normal mood and affect perceived. Normal judgment and thought content.  Rest of physical exam deferred due to type of encounter  No results found for this or any previous visit (from the past 24 hours).  Assessment & Plan:  1) Pregnancy G1P0 at [redacted]w[redacted]d with an Estimated Date of Delivery: 06/30/24   2) Anxiety -plan to increase zoloft  to 100mg  daily -pt has psych follow up scheduled for July  3) Back pain with h/o scoliosis -reviewed conservative management -Rx for Flexeril sent in to take prn []  referral/consult with anesthesia  regarding epidural   Meds:  Meds ordered this encounter  Medications   aspirin  EC 81 MG tablet    Sig: Take 1 tablet (81 mg total) by mouth daily. Swallow whole.    Dispense:  90 tablet    Refill:  3   sertraline  (ZOLOFT ) 100 MG tablet    Sig: Take 1 tablet (100 mg total) by mouth daily.    Dispense:  30 tablet    Refill:  11   cyclobenzaprine (FLEXERIL) 10 MG tablet    Sig: Take 1 tablet (10 mg total) by mouth every 8 (eight) hours as needed for muscle spasms.    Dispense:  30 tablet    Refill:  1    Labs/procedures today: none  Plan:  Continue routine obstetrical care .  Does have home bp cuff.    Next visit: prefers in person  due to PN2  Reviewed: Preterm labor symptoms and general obstetric precautions including but not limited to vaginal bleeding, contractions, leaking of fluid and fetal movement were reviewed in detail with the patient. The patient was advised to call back or seek an in-person office evaluation/go to [facility] at [facility] for any urgent or concerning symptoms. All questions were answered. Please refer to After Visit Summary for other counseling recommendations.    I provided 15 minutes of non-face-to-face time during this encounter, which including reviewing the chart, talking to patient and documentation.  Follow-up: Return in about 4 weeks (around 04/01/2024) for LROB and PN2.  No orders of the defined types were placed in this encounter. Blimi Godby, DO Attending Obstetrician & Gynecologist, [facility] [employer], [facility]

## 2024-03-19 ENCOUNTER — Telehealth: Payer: Self-pay | Admitting: *Deleted

## 2024-03-19 NOTE — Telephone Encounter (Signed)
 Called patient by request per [employer].  Patient states she is having neck pain that seems to be getting worse.  She is taking the Flexeril  as prescribed and using a heating pad as she can to the area but without much relief.  Questions about what else to do.  Informed at this point, she is doing all that can really be advised but could try using a lidocaine  patch or spray to the area occasionally.  Will have referral/consult with anesthesia at some point to discuss possible epidural d/t harrington rods and could discuss cervical neck pain then as well.  Pt verbalized understanding with no additional questions.

## 2024-03-31 ENCOUNTER — Other Ambulatory Visit: Payer: Self-pay | Admitting: *Deleted

## 2024-03-31 DIAGNOSIS — F99 Mental disorder, not otherwise specified: Secondary | ICD-10-CM

## 2024-03-31 MED ORDER — SERTRALINE HCL 100 MG PO TABS: 100.0000 mg | ORAL_TABLET | Freq: Every day | ORAL | 3 refills | Status: DC

## 2024-04-01 ENCOUNTER — Other Ambulatory Visit: Payer: MEDICAID

## 2024-04-01 ENCOUNTER — Encounter: Payer: Self-pay | Admitting: Advanced Practice Midwife

## 2024-04-01 ENCOUNTER — Ambulatory Visit: Payer: MEDICAID | Admitting: Advanced Practice Midwife

## 2024-04-01 VITALS — BP 116/73 | HR 97 | Wt 172.0 lb

## 2024-04-01 DIAGNOSIS — Z3A27 27 weeks gestation of pregnancy: Secondary | ICD-10-CM | POA: Diagnosis not present

## 2024-04-01 DIAGNOSIS — Z131 Encounter for screening for diabetes mellitus: Secondary | ICD-10-CM

## 2024-04-01 DIAGNOSIS — O0992 Supervision of high risk pregnancy, unspecified, second trimester: Secondary | ICD-10-CM

## 2024-04-01 DIAGNOSIS — O99891 Other specified diseases and conditions complicating pregnancy: Secondary | ICD-10-CM

## 2024-04-01 DIAGNOSIS — M41129 Adolescent idiopathic scoliosis, site unspecified: Secondary | ICD-10-CM | POA: Diagnosis not present

## 2024-04-01 NOTE — Patient Instructions (Signed)
 [email or web address], thank you for choosing our office today! We appreciate the opportunity to meet your healthcare needs. You may receive a short survey by mail, e-mail, or through [employer]. If you are happy with your care we would appreciate if you could take just a few minutes to complete the survey questions. We read all of your comments and take your feedback very seriously. Thank you again for choosing our office.  Center for [employer] Team at [facility]  [facility] at [facility] ([address] [place], [place] [address]) Entrance C, located off of [address] Free 24/7 valet parking   CLASSES: Go to [email or web address] to register for classes (childbirth, breastfeeding, waterbirth, infant CPR, daddy bootcamp, etc.)  Call the office [phone]) or go to [facility] if: You begin to have strong, frequent contractions Your water breaks.  Sometimes it is a big gush of fluid, sometimes it is just a trickle that keeps getting your panties wet or running down your legs You have vaginal bleeding.  It is normal to have a small amount of spotting if your cervix was checked.  You don't feel your baby moving like normal.  If you don't, get you something to eat and drink and lay down and focus on feeling your baby move.   If your baby is still not moving like normal, you should call the office or go to [facility].  Call the office [phone]) or go to [facility] hospital for these signs of pre-eclampsia: Severe headache that does not go away with Tylenol  Visual changes- seeing spots, double, blurred vision Pain under your right breast or upper abdomen that does not go away with Tums or heartburn medicine Nausea and/or vomiting Severe swelling in your hands, feet, and face   Tdap Vaccine It is recommended that you get the Tdap vaccine during the third trimester of EACH pregnancy to help protect your baby from getting pertussis (whooping cough) 27-36 weeks is the BEST time to do  this so that you can pass the protection on to your baby. During pregnancy is better than after pregnancy, but if you are unable to get it during pregnancy it will be offered at the hospital.  You can get this vaccine with us , at the health department, your family doctor, or some local pharmacies Everyone who will be around your baby should also be up-to-date on their vaccines before the baby comes. Adults (who are not pregnant) only need 1 dose of Tdap during adulthood.   [facility] Pediatricians/Family Doctors [facility]): [address] Meg Spina, [phone]           [facility]: [address], [phone]                [facility] Medicine [facility]): [address], [phone] (call to ask if accepting patients) [facility] Department: [address], [place], [phone]    [facility] Pediatricians/Family Doctors [facility]): [phone], [phone] [facility]: [address] [place], [phone] [facility]: [address], [phone]  [facility] Doctors  [facility]): [phone] [facility]: [address], [phone]   [facility] Doctors [facility]: [address], [phone]  [facility] Family Doctors  [facility]: [phone], [phone]  Home Blood Pressure Monitoring for Patients   Your provider has recommended that you check your  blood pressure (BP) at least once a week at home. If you do not have a blood pressure cuff at home, one will be provided for you. Contact your provider if you have not received your monitor within 1 week.   Helpful Tips for Accurate Home Blood Pressure Checks  Don't smoke, exercise, or drink caffeine 30 minutes before checking your BP Use the restroom before checking your BP (a full bladder can raise your  pressure) Relax in a comfortable upright chair Feet on the ground Left arm resting comfortably on a flat surface at the level of your heart Legs uncrossed Back supported Sit quietly and don't talk Place the cuff on your bare arm Adjust snuggly, so that only two fingertips can fit between your skin and the top of the cuff Check 2 readings separated by at least one minute Keep a log of your BP readings For a visual, please reference this diagram: http://ccnc.care/bpdiagram  Provider Name: [facility]     Phone: [phone]  Zone 1: ALL CLEAR  Continue to monitor your symptoms:  BP reading is less than 140 (top number) or less than 90 (bottom number)  No right upper stomach pain No headaches or seeing spots No feeling nauseated or throwing up No swelling in face and hands  Zone 2: CAUTION Call your doctor's office for any of the following:  BP reading is greater than 140 (top number) or greater than 90 (bottom number)  Stomach pain under your ribs in the middle or right side Headaches or seeing spots Feeling nauseated or throwing up Swelling in face and hands  Zone 3: EMERGENCY  Seek immediate medical care if you have any of the following:  BP reading is greater than160 (top number) or greater than 110 (bottom number) Severe headaches not improving with Tylenol  Serious difficulty catching your breath Any worsening symptoms from Zone 2   Third Trimester of Pregnancy The third trimester is from week 29 through week 42, months 7 through 9. The third trimester is a time when the fetus is growing rapidly. At the end of the ninth month, the fetus is about 20 inches in length and weighs 6-10 pounds.  BODY CHANGES Your body goes through many changes during pregnancy. The changes vary from woman to woman.  Your weight will continue to increase. You can expect to gain 25-35 pounds (11-16 kg) by the end of the pregnancy. You may begin to get stretch marks on your hips, abdomen,  and breasts. You may urinate more often because the fetus is moving lower into your pelvis and pressing on your bladder. You may develop or continue to have heartburn as a result of your pregnancy. You may develop constipation because certain hormones are causing the muscles that push waste through your intestines to slow down. You may develop hemorrhoids or swollen, bulging veins (varicose veins). You may have pelvic pain because of the weight gain and pregnancy hormones relaxing your joints between the bones in your pelvis. Backaches may result from overexertion of the muscles supporting your posture. You may have changes in your hair. These can include thickening of your hair, rapid growth, and changes in texture. Some women also have hair loss during or after pregnancy, or hair that feels dry or thin. Your hair will most likely return to normal after your baby is born. Your breasts will continue to grow and be tender. A yellow discharge may leak from your breasts called colostrum. Your belly button may stick out. You may  feel short of breath because of your expanding uterus. You may notice the fetus dropping, or moving lower in your abdomen. You may have a bloody mucus discharge. This usually occurs a few days to a week before labor begins. Your cervix becomes thin and soft (effaced) near your due date. WHAT TO EXPECT AT YOUR PRENATAL EXAMS  You will have prenatal exams every 2 weeks until week 36. Then, you will have weekly prenatal exams. During a routine prenatal visit: You will be weighed to make sure you and the fetus are growing normally. Your blood pressure is taken. Your abdomen will be measured to track your baby's growth. The fetal heartbeat will be listened to. Any test results from the previous visit will be discussed. You may have a cervical check near your due date to see if you have effaced. At around 36 weeks, your caregiver will check your cervix. At the same time, your  caregiver will also perform a test on the secretions of the vaginal tissue. This test is to determine if a type of bacteria, Group B streptococcus, is present. Your caregiver will explain this further. Your caregiver may ask you: What your birth plan is. How you are feeling. If you are feeling the baby move. If you have had any abnormal symptoms, such as leaking fluid, bleeding, severe headaches, or abdominal cramping. If you have any questions. Other tests or screenings that may be performed during your third trimester include: Blood tests that check for low iron levels (anemia). Fetal testing to check the health, activity level, and growth of the fetus. Testing is done if you have certain medical conditions or if there are problems during the pregnancy. FALSE LABOR You may feel small, irregular contractions that eventually go away. These are called Braxton Hicks contractions, or false labor. Contractions may last for hours, days, or even weeks before true labor sets in. If contractions come at regular intervals, intensify, or become painful, it is best to be seen by your caregiver.  SIGNS OF LABOR  Menstrual-like cramps. Contractions that are 5 minutes apart or less. Contractions that start on the top of the uterus and spread down to the lower abdomen and back. A sense of increased pelvic pressure or back pain. A watery or bloody mucus discharge that comes from the vagina. If you have any of these signs before the 37th week of pregnancy, call your caregiver right away. You need to go to the hospital to get checked immediately. HOME CARE INSTRUCTIONS  Avoid all smoking, herbs, alcohol, and unprescribed drugs. These chemicals affect the formation and growth of the baby. Follow your caregiver's instructions regarding medicine use. There are medicines that are either safe or unsafe to take during pregnancy. Exercise only as directed by your caregiver. Experiencing uterine cramps is a good sign to  stop exercising. Continue to eat regular, healthy meals. Wear a good support bra for breast tenderness. Do not use hot tubs, steam rooms, or saunas. Wear your seat belt at all times when driving. Avoid raw meat, uncooked cheese, cat litter boxes, and soil used by cats. These carry germs that can cause birth defects in the baby. Take your prenatal vitamins. Try taking a stool softener (if your caregiver approves) if you develop constipation. Eat more high-fiber foods, such as fresh vegetables or fruit and whole grains. Drink plenty of fluids to keep your urine clear or pale yellow. Take warm sitz baths to soothe any pain or discomfort caused by hemorrhoids. Use hemorrhoid cream if  your caregiver approves. If you develop varicose veins, wear support hose. Elevate your feet for 15 minutes, 3-4 times a day. Limit salt in your diet. Avoid heavy lifting, wear low heal shoes, and practice good posture. Rest a lot with your legs elevated if you have leg cramps or low back pain. Visit your dentist if you have not gone during your pregnancy. Use a soft toothbrush to brush your teeth and be gentle when you floss. A sexual relationship may be continued unless your caregiver directs you otherwise. Do not travel far distances unless it is absolutely necessary and only with the approval of your caregiver. Take prenatal classes to understand, practice, and ask questions about the labor and delivery. Make a trial run to the hospital. Pack your hospital bag. Prepare the baby's nursery. Continue to go to all your prenatal visits as directed by your caregiver. SEEK MEDICAL CARE IF: You are unsure if you are in labor or if your water has broken. You have dizziness. You have mild pelvic cramps, pelvic pressure, or nagging pain in your abdominal area. You have persistent nausea, vomiting, or diarrhea. You have a bad smelling vaginal discharge. You have pain with urination. SEEK IMMEDIATE MEDICAL CARE IF:  You  have a fever. You are leaking fluid from your vagina. You have spotting or bleeding from your vagina. You have severe abdominal cramping or pain. You have rapid weight loss or gain. You have shortness of breath with chest pain. You notice sudden or extreme swelling of your face, hands, ankles, feet, or legs. You have not felt your baby move in over an hour. You have severe headaches that do not go away with medicine. You have vision changes. Document Released: 10/02/2001 Document Revised: 10/13/2013 Document Reviewed: 12/09/2012 [facility] Patient Information 2015 [place], [place]. This information is not intended to replace advice given to you by your health care provider. Make sure you discuss any questions you have with your health care provider.

## 2024-04-01 NOTE — Progress Notes (Signed)
   LOW-RISK PREGNANCY VISIT Patient name: Victoria [name] MRN [id number]  Date of birth: 1991/06/01 Chief Complaint:   Routine Prenatal Visit  History of Present Illness:   Victoria [name] is a 33 y.o. G1P0 female at [redacted]w[redacted]d with an Estimated Date of Delivery: 06/30/24 being seen today for ongoing management of a low-risk pregnancy.  Today she reports feeling more fatigued. Contractions: Not present. Vag. Bleeding: None.  Movement: Present. denies leaking of fluid. Review of Systems:   Pertinent items are noted in HPI Denies abnormal vaginal discharge w/ itching/odor/irritation, headaches, visual changes, shortness of breath, chest pain, abdominal pain, severe nausea/vomiting, or problems with urination or bowel movements unless otherwise stated above. Pertinent History Reviewed:  Reviewed past medical,surgical, social, obstetrical and family history.  Reviewed problem list, medications and allergies. Physical Assessment:   Vitals:   04/01/24 0916  BP: 116/73  Pulse: 97  Weight: 172 lb (78 kg)  Body mass index is 29.52 kg/m.        Physical Examination:   General appearance: Well appearing, and in no distress  Mental status: Alert, oriented to person, place, and time  Skin: Warm & dry  Cardiovascular: Normal heart rate noted  Respiratory: Normal respiratory effort, no distress  Abdomen: Soft, gravid, nontender  Pelvic: Cervical exam deferred         Extremities: Edema: None  Fetal Status:     Movement: Present    No results found for this or any previous visit (from the past 24 hours).  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  Labs/procedures today: PN2; Tdap info given  Plan:  Continue routine obstetrical care   Reviewed: Preterm labor symptoms and general obstetric precautions including but not limited to vaginal bleeding, contractions, leaking of fluid and fetal movement were reviewed in detail with the patient.  All questions were answered. Has home bp cuff. Check bp weekly, let us  know if >140/90.   Follow-up: Return in about 3 weeks (around 04/22/2024) for LROB, in person.  Orders Placed This Encounter  Procedures   Ambulatory referral to Anesthesiology   Jolayne Natter [facility] 04/01/2024 9:57 AM

## 2024-04-02 LAB — CBC
Hematocrit: 36 % (ref 34.0–46.6)
Hemoglobin: 11.3 g/dL (ref 11.1–15.9)
MCH: 26.4 pg — ABNORMAL LOW (ref 26.6–33.0)
MCHC: 31.4 g/dL — ABNORMAL LOW (ref 31.5–35.7)
MCV: 84 fL (ref 79–97)
Platelets: 206 10*3/uL (ref 150–450)
RBC: 4.28 x10E6/uL (ref 3.77–5.28)
RDW: 12.8 % (ref 11.7–15.4)
WBC: 7.8 10*3/uL (ref 3.4–10.8)

## 2024-04-02 LAB — RPR: RPR Ser Ql: NONREACTIVE

## 2024-04-02 LAB — HIV ANTIBODY (ROUTINE TESTING W REFLEX): HIV Screen 4th Generation wRfx: NONREACTIVE

## 2024-04-02 LAB — GLUCOSE TOLERANCE, 2 HOURS W/ 1HR

## 2024-04-02 LAB — ANTIBODY SCREEN: Antibody Screen: NEGATIVE

## 2024-04-04 ENCOUNTER — Ambulatory Visit: Payer: Self-pay | Admitting: Advanced Practice Midwife

## 2024-04-06 ENCOUNTER — Telehealth: Payer: Self-pay | Admitting: *Deleted

## 2024-04-06 NOTE — Telephone Encounter (Signed)
 Called patient as requested via mychart.  Pt wanting to know if and when she would get another ultrasound to check on the baby's weight.  Informed patient as long as her fundal height was measuring appropriately for her gestational age, then she would not get another u/s.  Pt verbalized understanding with no further questions.
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
## 2024-04-20 ENCOUNTER — Ambulatory Visit: Payer: MEDICAID | Admitting: Obstetrics and Gynecology

## 2024-04-20 ENCOUNTER — Encounter: Payer: Self-pay | Admitting: Obstetrics and Gynecology

## 2024-04-20 VITALS — BP 110/74 | HR 112 | Wt 172.0 lb

## 2024-04-20 DIAGNOSIS — Z3A29 29 weeks gestation of pregnancy: Secondary | ICD-10-CM

## 2024-04-20 DIAGNOSIS — Z23 Encounter for immunization: Secondary | ICD-10-CM | POA: Diagnosis not present

## 2024-04-20 DIAGNOSIS — F410 Panic disorder [episodic paroxysmal anxiety] without agoraphobia: Secondary | ICD-10-CM

## 2024-04-20 DIAGNOSIS — Z87891 Personal history of nicotine dependence: Secondary | ICD-10-CM

## 2024-04-20 DIAGNOSIS — Z8679 Personal history of other diseases of the circulatory system: Secondary | ICD-10-CM

## 2024-04-20 DIAGNOSIS — Z3403 Encounter for supervision of normal first pregnancy, third trimester: Secondary | ICD-10-CM

## 2024-04-20 DIAGNOSIS — O99343 Other mental disorders complicating pregnancy, third trimester: Secondary | ICD-10-CM | POA: Diagnosis not present

## 2024-04-20 DIAGNOSIS — F1221 Cannabis dependence, in remission: Secondary | ICD-10-CM

## 2024-04-20 DIAGNOSIS — B009 Herpesviral infection, unspecified: Secondary | ICD-10-CM

## 2024-04-20 DIAGNOSIS — O283 Abnormal ultrasonic finding on antenatal screening of mother: Secondary | ICD-10-CM

## 2024-04-20 DIAGNOSIS — F4310 Post-traumatic stress disorder, unspecified: Secondary | ICD-10-CM

## 2024-04-20 DIAGNOSIS — Z8739 Personal history of other diseases of the musculoskeletal system and connective tissue: Secondary | ICD-10-CM

## 2024-04-20 DIAGNOSIS — O98313 Other infections with a predominantly sexual mode of transmission complicating pregnancy, third trimester: Secondary | ICD-10-CM

## 2024-04-20 DIAGNOSIS — F1021 Alcohol dependence, in remission: Secondary | ICD-10-CM

## 2024-04-20 NOTE — Progress Notes (Signed)
   PRENATAL VISIT NOTE  Subjective:  Victoria [name] is a 33 y.o. G1P0 at [redacted]w[redacted]d being seen today for ongoing prenatal care.  She is currently monitored for the following issues for this low-risk pregnancy and has Alcohol use disorder, severe, in early remission [facility]); PTSD (post-traumatic stress disorder); Tobacco use disorder; Carpal tunnel syndrome of right wrist; Generalized anxiety disorder, r/o substance/medication-induced anxiety disorder; Pityriasis rosea; Postinflammatory hyperpigmentation; Panic disorder; Substance or medication-induced bipolar and related disorder, r/o bipolar II disorder; Secondary insomnia; Social anxiety disorder; Supervision of normal pregnancy; HSV (herpes simplex virus) infection; History of chronic hypertension; and Cannabis use disorder, moderate, in sustained remission (HCC) on their problem list.  Patient reports doing well overall but worried that we aren't going to do more ultrasounds due to EIF.  Contractions: Not present. Vag. Bleeding: None.  Movement: Present. Denies leaking of fluid.   The following portions of the patient's history were reviewed and updated as appropriate: allergies, current medications, past family history, past medical history, past social history, past surgical history and problem list.   Objective:   Vitals:   04/20/24 0847  BP: 110/74  Pulse: (!) 112  Weight: 172 lb (78 kg)    Fetal Status: Fetal Heart Rate (bpm): 160 Fundal Height: 30 cm Movement: Present     General:  Alert, oriented and cooperative. Patient is in no acute distress.  Skin: Skin is warm and dry. No rash noted.   Cardiovascular: Normal heart rate noted  Respiratory: Normal respiratory effort, no problems with respiration noted  Abdomen: Soft, gravid, appropriate for gestational age.  Pain/Pressure: Absent      Assessment and Plan:  Pregnancy: G1P0 at [redacted]w[redacted]d 1. Encounter for supervision of normal first pregnancy in third trimester (Primary) 2. [redacted]  weeks gestation of pregnancy Normal third tri labs last appt at [facility] today  3. Echogenic intracardiac focus of fetus on prenatal ultrasound LR NIPS Reassurance provided. Explained diagnosis of EIF but that there were no structural abnormalities seen on her anatomy scan.   5. PTSD (post-traumatic stress disorder) 6. Panic disorder Currently on trazodone , zoloft , gabapentin , and seroquel  Following with psychiatry, next appt tomorrow No current counselor/therapist Reports anxiety in general but that mood is good overall  7. HSV (herpes simplex virus) infection Suppression at 36 weeks  8. History of chronic hypertension Reports one extremely high BP elevation in the past, but has otherwise had normal blood pressures  Has been normotensive this entire pregnancy Normal baseline labs, ldASA  4. History of tobacco use 9. Cannabis use disorder, moderate, in sustained remission (HCC) 10. Alcohol use disorder, severe, in early remission (HCC) Reports no use during pregnancy  11. History of scoliosis Harrington rods in place, was told by her surgeon that she cannot safely get an epidural Will f/u anesthesia recs   Return in about 2 weeks (around 05/04/2024) for return OB at 31 weeks.  Future Appointments  Date Time Provider Department Center  04/21/2024  1:30 PM Mercy Lauraine LABOR [facility] None  04/22/2024 11:50 AM Ozan, Jennifer, DO [facility]    Kieth JAYSON Carolin, MD

## 2024-04-21 ENCOUNTER — Telehealth (INDEPENDENT_AMBULATORY_CARE_PROVIDER_SITE_OTHER): Payer: MEDICAID | Admitting: Psychiatry

## 2024-04-21 ENCOUNTER — Encounter (HOSPITAL_COMMUNITY): Payer: Self-pay | Admitting: Psychiatry

## 2024-04-21 DIAGNOSIS — F329 Major depressive disorder, single episode, unspecified: Secondary | ICD-10-CM

## 2024-04-21 DIAGNOSIS — F3181 Bipolar II disorder: Secondary | ICD-10-CM

## 2024-04-21 DIAGNOSIS — F1221 Cannabis dependence, in remission: Secondary | ICD-10-CM | POA: Diagnosis not present

## 2024-04-21 DIAGNOSIS — F4010 Social phobia, unspecified: Secondary | ICD-10-CM

## 2024-04-21 DIAGNOSIS — F4310 Post-traumatic stress disorder, unspecified: Secondary | ICD-10-CM

## 2024-04-21 DIAGNOSIS — F1021 Alcohol dependence, in remission: Secondary | ICD-10-CM

## 2024-04-21 MED ORDER — TRAZODONE HCL 50 MG PO TABS: 25.0000 mg | ORAL_TABLET | Freq: Every evening | ORAL | 1 refills | Status: DC | PRN

## 2024-04-21 MED ORDER — GABAPENTIN 100 MG PO CAPS: 100.0000 mg | ORAL_CAPSULE | Freq: Two times a day (BID) | ORAL | 1 refills | Status: DC | PRN

## 2024-04-21 MED ORDER — QUETIAPINE FUMARATE 25 MG PO TABS: 25.0000 mg | ORAL_TABLET | Freq: Every day | ORAL | 1 refills | Status: DC

## 2024-04-21 NOTE — Patient Instructions (Signed)
 Thank you for attending your appointment today.  -- We did not make any medication changes today. Please continue medications as prescribed. -- You may try taking half tablet trazodone  (25 mg nightly) if you feel the full tablet is too sedating  Please do not make any changes to medications without first discussing with your provider. If you are experiencing a psychiatric emergency, please call 911 or present to your nearest emergency department. Additional crisis, medication management, and therapy resources are included below.  [facility]  [address] [place], [place] [address] [phone] WALK-IN URGENT CARE 24/7 FOR ANYONE [address] [place], [place]  [phone] Fax: [phone] [email or web address] *Interpreters available *Accepts all insurance and uninsured for Urgent Care needs *Accepts Medicaid and uninsured for outpatient treatment (below)      ONLY FOR [facility]  Below:    Outpatient New Patient Assessment/Therapy Walk-ins:        Monday, Wednesday, and Thursday 8am until slots are full (first come, first served)                   New Patient Psychiatry/Medication Management        Monday-Friday 8am-11am (first come, first served)               For all walk-ins we ask that you arrive by 7:15am, because patients will be seen in the order of arrival.

## 2024-04-22 ENCOUNTER — Telehealth: Payer: MEDICAID | Admitting: Obstetrics & Gynecology

## 2024-04-22 ENCOUNTER — Encounter: Payer: Self-pay | Admitting: Obstetrics & Gynecology

## 2024-04-22 VITALS — BP 120/81 | HR 105

## 2024-04-22 DIAGNOSIS — Z3A30 30 weeks gestation of pregnancy: Secondary | ICD-10-CM | POA: Diagnosis not present

## 2024-04-22 DIAGNOSIS — F99 Mental disorder, not otherwise specified: Secondary | ICD-10-CM

## 2024-04-22 DIAGNOSIS — M25519 Pain in unspecified shoulder: Secondary | ICD-10-CM

## 2024-04-22 DIAGNOSIS — Z3403 Encounter for supervision of normal first pregnancy, third trimester: Secondary | ICD-10-CM | POA: Diagnosis not present

## 2024-04-22 DIAGNOSIS — M542 Cervicalgia: Secondary | ICD-10-CM | POA: Diagnosis not present

## 2024-04-22 DIAGNOSIS — Z8739 Personal history of other diseases of the musculoskeletal system and connective tissue: Secondary | ICD-10-CM | POA: Insufficient documentation

## 2024-04-22 HISTORY — DX: Personal history of other diseases of the musculoskeletal system and connective tissue: Z87.39

## 2024-04-22 NOTE — Progress Notes (Signed)
 TELEHEALTH VIRTUAL OBSTETRICS VISIT ENCOUNTER NOTE Patient name: Victoria [name] MRN [id number]  Date of birth: 1991-07-25  I connected with patient on 04/22/24 at 11:50 AM EDT by vidoe and verified that I am speaking with the correct person using two identifiers. Pt is not currently in our office, she is at home.  The provider is in the office.    I discussed the limitations, risks, security and privacy concerns of performing an evaluation and management service by telephone and the availability of in person appointments. I also discussed with the patient that there may be a patient responsible charge related to this service. The patient expressed understanding and agreed to proceed.  Chief Complaint:   Routine Prenatal Visit (Neck pain and shoulder pain-flexeril  and Tylenol  not helping)  History of Present Illness:   Victoria [name] is a 33 y.o. G1P0 female at [redacted]w[redacted]d with an Estimated Date of Delivery: 06/30/24 being evaluated today for ongoing management of a low-risk pregnancy.     12/24/2023   10:11 AM 12/05/2023    9:01 AM 11/26/2023    2:30 PM 05/08/2023    2:58 PM 05/01/2023    8:24 AM  Depression screen PHQ 2/9  Decreased Interest 0      Down, Depressed, Hopeless 0      PHQ - 2 Score 0      Altered sleeping 2      Tired, decreased energy 2      Change in appetite 2      Feeling bad or failure about yourself  0      Trouble concentrating 0      Moving slowly or fidgety/restless 0      Suicidal thoughts 0      PHQ-9 Score 6      Difficult doing work/chores          Information is confidential and restricted. Go to Review Flowsheets to unlock data.    She has been struggling with left sided shoulder pain that radiates from her neck down to her shoulder only on the left side.  She is using a heating pack as well as Tylenol  with minimal improvement.  She has also tried Flexeril  and this also does not seem to help.  Feels best when she is lying down and worsening when she  turns her head to the left.  Denies any other acute complaints Contractions: Not present. Vag. Bleeding: None.  Movement: Present. denies leaking of fluid. Review of Systems:   Pertinent items are noted in HPI Denies abnormal vaginal discharge w/ itching/odor/irritation, headaches, visual changes, shortness of breath, chest pain, abdominal pain, severe nausea/vomiting, or problems with urination or bowel movements unless otherwise stated above. Pertinent History Reviewed:  Reviewed past medical,surgical, social, obstetrical and family history.  Reviewed problem list, medications and allergies. Physical Assessment:   Vitals:   04/22/24 1154  BP: 120/81  Pulse: (!) 105  There is no height or weight on file to calculate BMI.        Physical Examination:   General:  Alert, oriented and cooperative.   Mental Status: Normal mood and affect perceived. Normal judgment and thought content.  Rest of physical exam deferred due to type of encounter  No results found for this or any previous visit (from the past 24 hours).  Assessment & Plan:  1) Pregnancy G1P0 at [redacted]w[redacted]d with an Estimated Date of Delivery: 06/30/24   2) Neck/shoulder pain-musculoskeletal - Patient to continue with Tylenol  and Flexeril  as needed  -  also continue with warm compresses or even a tub bath -Gabapentin  has also been sent and patient to give this a trial as well and hopeful that the pain will resolve with time  -routine OB care   Meds: No orders of the defined types were placed in this encounter.   Labs/procedures today: none  Plan:  Continue routine obstetrical care .    Next visit: prefers either one    Reviewed: Preterm labor symptoms and general obstetric precautions including but not limited to vaginal bleeding, contractions, leaking of fluid and fetal movement were reviewed in detail with the patient. The patient was advised to call back or seek an in-person office evaluation/go to [facility] at [facility] for any urgent or concerning symptoms. All questions were answered. Please refer to After Visit Summary for other counseling recommendations.    I provided 10 minutes of non-face-to-face time during this encounter, which including reviewing the chart, talking to patient and documentation.  Follow-up: Return in about 2 weeks (around 05/06/2024) for LROB visit.  No orders of the defined types were placed in this encounter.  Woodfin Kiss, DO Attending Obstetrician & Gynecologist, [facility] [employer], [facility]

## 2024-04-23 ENCOUNTER — Other Ambulatory Visit: Payer: Self-pay

## 2024-04-23 ENCOUNTER — Encounter (HOSPITAL_COMMUNITY): Payer: Self-pay | Admitting: *Deleted

## 2024-04-23 ENCOUNTER — Emergency Department (HOSPITAL_COMMUNITY)
Admission: EM | Admit: 2024-04-23 | Discharge: 2024-04-23 | Disposition: A | Discharge status: Home / Self Care | Payer: MEDICAID | Attending: Emergency Medicine | Admitting: Emergency Medicine

## 2024-04-23 DIAGNOSIS — Z7982 Long term (current) use of aspirin: Secondary | ICD-10-CM | POA: Insufficient documentation

## 2024-04-23 DIAGNOSIS — M542 Cervicalgia: Secondary | ICD-10-CM | POA: Insufficient documentation

## 2024-04-23 DIAGNOSIS — O99891 Other specified diseases and conditions complicating pregnancy: Secondary | ICD-10-CM | POA: Insufficient documentation

## 2024-04-23 DIAGNOSIS — M25512 Pain in left shoulder: Secondary | ICD-10-CM | POA: Diagnosis not present

## 2024-04-23 MED ORDER — DICLOFENAC SODIUM 1 % EX GEL: 2.0000 g | Freq: Four times a day (QID) | CUTANEOUS | 0 refills | Status: DC

## 2024-04-23 MED ORDER — LIDOCAINE 5 % EX PTCH: 1.0000 | MEDICATED_PATCH | CUTANEOUS | 0 refills | Status: DC

## 2024-04-23 NOTE — ED Provider Notes (Signed)
 [place] EMERGENCY DEPARTMENT AT [facility] Provider Note   CSN: [id number] Arrival date & time: 04/23/24  1312     Patient presents with: Shoulder Pain   Victoria [name] is a 33 y.o. female.  Patient presents to the emergency department with concerns of neck/shoulder pain.  Reports has been ongoing for the last 4 days.  States that this is all left-sided.  She is describes a radiating sensation from the neck into the left shoulder.  Denies any recent injury or falls.  States that she woke up 1 morning 4 days ago and had pain in her neck.  History of pain in this area but not to this extent.  Has been trying to take Tylenol  without significant improvement in symptoms.    Shoulder Pain      Prior to Admission medications   Medication Sig Start Date End Date Taking? Authorizing Provider  diclofenac  Sodium (VOLTAREN ) 1 % GEL Apply 2 g topically 4 (four) times daily. 04/23/24  Yes Vanity Larsson A, PA-C  lidocaine  (LIDODERM ) 5 % Place 1 patch onto the skin daily. Remove & Discard patch within 12 hours or as directed by MD 04/23/24  Yes Dortha Neighbors A, PA-C  aspirin  EC 81 MG tablet Take 1 tablet (81 mg total) by mouth daily. Swallow whole. 03/04/24   Ozan, Jennifer, DO  cyclobenzaprine  (FLEXERIL ) 10 MG tablet Take 1 tablet (10 mg total) by mouth every 8 (eight) hours as needed for muscle spasms. 03/04/24   Ozan, Jennifer, DO  gabapentin  (NEURONTIN ) 100 MG capsule Take 1 capsule (100 mg total) by mouth 2 (two) times daily as needed (acute anxiety). Patient not taking: Reported on 04/22/2024 04/21/24 06/20/24  Mercy Domino A  Prenatal Vit-Fe Fumarate-FA (M-NATAL PLUS) 27-1 MG TABS Take 1 tablet by mouth daily. 01/24/24   [provider]  Prenatal Vit-Fe Fumarate-FA (MULTIVITAMIN-PRENATAL) 27-0.8 MG TABS tablet Take 1 tablet by mouth daily at 12 noon. Patient not taking: Reported on 04/22/2024 01/22/24   Loreli Suzen BIRCH, CNM  promethazine  (PHENERGAN ) 25 MG tablet Take 0.5-1  tablets (12.5-25 mg total) by mouth every 6 (six) hours as needed. 12/24/23   Kizzie Suzen SAUNDERS, CNM  QUEtiapine  (SEROQUEL ) 25 MG tablet Take 1 tablet (25 mg total) by mouth at bedtime. 04/21/24 10/18/24  Bahraini, Sarah A  sertraline  (ZOLOFT ) 100 MG tablet Take 1 tablet (100 mg total) by mouth daily. 03/31/24   Ozan, Jennifer, DO  traZODone  (DESYREL ) 50 MG tablet Take 0.5-1 tablets (25-50 mg total) by mouth at bedtime as needed for sleep. 04/21/24   Bahraini, Sarah A  rizatriptan (MAXALT-MLT) 5 MG disintegrating tablet Take 5 mg by mouth daily as needed for migraine. 07/15/18 06/05/19  [provider]    Allergies: Lurasidone  hcl    Review of Systems  Musculoskeletal:        Shoulder pain  All other systems reviewed and are negative.   Updated Vital Signs BP 130/78   Pulse (!) 114   Temp 98 F (36.7 C) (Oral)   Resp 18   LMP 08/29/2023   SpO2 96%   Physical Exam Vitals and nursing note reviewed.  Constitutional:      General: She is not in acute distress.    Appearance: She is well-developed.  HENT:     Head: Normocephalic and atraumatic.  Eyes:     Conjunctiva/sclera: Conjunctivae normal.  Cardiovascular:     Rate and Rhythm: Normal rate and regular rhythm.     Heart sounds: No  murmur heard. Pulmonary:     Effort: Pulmonary effort is normal. No respiratory distress.     Breath sounds: Normal breath sounds.  Abdominal:     Palpations: Abdomen is soft.     Tenderness: There is no abdominal tenderness.  Musculoskeletal:        General: Tenderness present. No swelling, deformity or signs of injury.       Arms:     Cervical back: Neck supple.  Skin:    General: Skin is warm and dry.     Capillary Refill: Capillary refill takes less than 2 seconds.  Neurological:     Mental Status: She is alert.  Psychiatric:        Mood and Affect: Mood normal.     (all labs ordered are listed, but only abnormal results are displayed) Labs Reviewed - No data to  display  EKG: None  Radiology: No results found.   Procedures   Medications Ordered in the ED - No data to display                                  Medical Decision Making Risk Prescription drug management.   This patient presents to the ED for concern of shoulder pain.  Differential diagnosis includes cervical radiculopathy, clavicle fracture, muscle strain, cervical strain    Problem List / ED Course:  Patient presents the emergency department concerns of left-sided shoulder pain.  Reports has been ongoing for the last 4 days and his constant in nature.  Denies any recent trauma or injury.  She reports that she is currently [redacted] weeks pregnant.  She reports that she was seen by her PCP/OB/GYN earlier today and advised her to use warm compresses for about the area of pain.  Patient tolerated improvement in symptoms. On exam, patient has no significant abnormal findings.  There is some point tenderness towards the paraspinal region of the cervical spine and axial loading does worsen radicular type pain into the left shoulder. Given patient's current pregnancy status [redacted] weeks along, medication management is difficult.  Tylenol , see minimal in terms of pain alleviation.  There is no trauma to this area patient not reporting any sort of shortness of breath or chest pain, doubtful of more concerning etiologies such as ACS or PE.  Patient is tachycardic which is abnormal but also somewhat expected in patient's current third trimester pregnancy.  I had discussion with patient on possible imaging versus conservative management given concerns for radiation exposure.  Patient preferred to hold off on imaging given atraumatic history.  Prefer to proceed with medication management as needed. Given patient currently in third trimester, discharged home with combination of topical lidocaine  patches as well as a topical Taryn gel.  Advised continues of Tylenol .  Also encouraged continued use of warm  compresses and gentle massage over the area.  Encourage patient to return the emergency department for any concerns of new or worsening or changing symptoms.  Stable at this time for outpatient follow-up and discharged home.   Social Determinants of Health:  Third trimester pregnancy  Final diagnoses:  Neck pain    ED Discharge Orders          Ordered    lidocaine  (LIDODERM ) 5 %  Every 24 hours        04/23/24 1412    diclofenac  Sodium (VOLTAREN ) 1 % GEL  4 times daily  04/23/24 1412               Cecily Legrand LABOR, PA-C 04/23/24 2133    Pamella Ozell LABOR, DO 04/25/24 [phone]

## 2024-04-23 NOTE — ED Triage Notes (Signed)
 Pt with left shoulder pain x4 day.  Not associated with any injury. Pain constant and throbbing.  No sob.  Pt is [redacted] weeks pregnant but does not have any pregnancy related symptoms.

## 2024-04-23 NOTE — Discharge Instructions (Signed)
 You are seen in the emergency department today for concerns of shoulder/neck pain.  Based on her exam, I do suspect you likely have what is called cervical radiculopathy which is essentially pinched nerve in your neck that is causing the pain that goes in towards your shoulder.  I am sending you home with a combination of the medications to try to help manage the symptoms.  1 is a topical gel called Voltaren.  This is an anti-inflammatory that is safe in pregnancy as there is minimal to no absorption in your body from this topical application.  This is safe for your pregnancy.  I am also sending home with a lidocaine  patch prescription which is helpful to try to anesthetize the area of pain.  Please follow-up with your OB/GYN for further evaluation.  For any concerns of new or worsening symptoms, return to the emergency department.

## 2024-04-23 NOTE — ED Triage Notes (Addendum)
 Pt arrives via POV. PT c/o left shoulder pain that has been constant for the past 4 days. PT states she is [redacted] weeks pregnant. PT denies any abdominal cramping, bleeding, sob or any any other associated symptoms. Was told by her ob to come to the ED if the pain did not improve.  Pt is AxOx4.

## 2024-04-28 ENCOUNTER — Telehealth: Payer: Self-pay

## 2024-04-28 NOTE — Telephone Encounter (Signed)
 Called patient regarding neck and shoulder pain. Per MD [facility] would be the best place for her to go for treatment. Patient educated when to message us  regarding further care and if things get worse. All questions answered.

## 2024-04-29 ENCOUNTER — Encounter: Payer: Self-pay | Admitting: Obstetrics & Gynecology

## 2024-05-06 ENCOUNTER — Ambulatory Visit: Payer: MEDICAID | Admitting: Obstetrics and Gynecology

## 2024-05-06 ENCOUNTER — Encounter: Payer: Self-pay | Admitting: Obstetrics and Gynecology

## 2024-05-06 VITALS — BP 112/73 | HR 104 | Wt 173.4 lb

## 2024-05-06 DIAGNOSIS — F410 Panic disorder [episodic paroxysmal anxiety] without agoraphobia: Secondary | ICD-10-CM | POA: Diagnosis not present

## 2024-05-06 DIAGNOSIS — Z3403 Encounter for supervision of normal first pregnancy, third trimester: Secondary | ICD-10-CM | POA: Diagnosis not present

## 2024-05-06 DIAGNOSIS — Z8739 Personal history of other diseases of the musculoskeletal system and connective tissue: Secondary | ICD-10-CM

## 2024-05-06 DIAGNOSIS — Z8679 Personal history of other diseases of the circulatory system: Secondary | ICD-10-CM

## 2024-05-06 DIAGNOSIS — Z3A32 32 weeks gestation of pregnancy: Secondary | ICD-10-CM

## 2024-05-06 DIAGNOSIS — F4310 Post-traumatic stress disorder, unspecified: Secondary | ICD-10-CM

## 2024-05-06 DIAGNOSIS — B009 Herpesviral infection, unspecified: Secondary | ICD-10-CM

## 2024-05-06 NOTE — Progress Notes (Signed)
   PRENATAL VISIT NOTE  Subjective:  Victoria [name] is a 33 y.o. G1P0 at [redacted]w[redacted]d being seen today for ongoing prenatal care.  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  Patient reports .  Contractions: Irritability. Vag. Bleeding: None.  Movement: Present. Denies leaking of fluid.   The following portions of the patient's history were reviewed and updated as appropriate: allergies, current medications, past family history, past medical history, past social history, past surgical history and problem list.   Objective:    Vitals:   05/06/24 0822  BP: 112/73  Pulse: (!) 104  Weight: 173 lb 6.4 oz (78.7 kg)    Fetal Status:  Fetal Heart Rate (bpm): 144 Fundal Height: 32 cm Movement: Present    General: Alert, oriented and cooperative. Patient is in no acute distress.  Skin: Skin is warm and dry. No rash noted.   Cardiovascular: Normal heart rate noted  Respiratory: Normal respiratory effort, no problems with respiration noted  Abdomen: Soft, gravid, appropriate for gestational age.  Pain/Pressure: Present     Pelvic: Cervical exam deferred        Extremities: Normal range of motion.  Edema: None  Mental Status: Normal mood and affect. Normal behavior. Normal judgment and thought content.   Assessment and Plan:  Pregnancy: G1P0 at  [redacted]w[redacted]d 1. Encounter for supervision of normal first pregnancy in third trimester (Primary) BP and FHR normal Doing well, feeling regular movement    2. PTSD (post-traumatic stress disorder) 3. Panic disorder On seroquel , trazodone , gabapentin  Follows w/ psychiatry, last visit 7/1  4. HSV (herpes simplex virus) infection Suppression 36 weeks  5. History of chronic hypertension Continue ASA  6. History of scoliosis Referral in for anesthesia consult, hx harrington rods  7. [redacted] weeks gestation of pregnancy Anticipatory guidance regarding upcoming appts  Preterm labor symptoms and general obstetric precautions including but not limited to vaginal bleeding, contractions, leaking of fluid and fetal movement were reviewed in detail with the patient. Please refer to After Visit Summary for other counseling recommendations.   Return in about 2 weeks (around 05/20/2024) for OB VISIT (MD or APP).  Future Appointments  Date Time Provider Department Center  05/19/2024 10:35 AM Leftwich-Kirby, Olam LABOR, CNM [facility] None  06/18/2024  3:00 PM Mercy Lauraine LABOR [facility] None  07/16/2024  2:00 PM Bahraini, Sarah A [facility] None    Nidia Daring, [place]

## 2024-05-06 NOTE — Progress Notes (Signed)
 Pt presents for ROB visit. No concerns

## 2024-05-18 ENCOUNTER — Telehealth: Payer: MEDICAID

## 2024-05-19 ENCOUNTER — Ambulatory Visit (INDEPENDENT_AMBULATORY_CARE_PROVIDER_SITE_OTHER): Payer: MEDICAID | Admitting: Advanced Practice Midwife

## 2024-05-19 ENCOUNTER — Other Ambulatory Visit (HOSPITAL_COMMUNITY)
Admission: RE | Admit: 2024-05-19 | Discharge: 2024-05-19 | Disposition: A | Discharge status: Home / Self Care | Payer: MEDICAID | Source: Ambulatory Visit | Attending: Advanced Practice Midwife | Admitting: Advanced Practice Midwife

## 2024-05-19 ENCOUNTER — Encounter: Payer: Self-pay | Admitting: Advanced Practice Midwife

## 2024-05-19 VITALS — BP 107/66 | HR 110 | Wt 173.6 lb

## 2024-05-19 DIAGNOSIS — Z3A34 34 weeks gestation of pregnancy: Secondary | ICD-10-CM | POA: Diagnosis not present

## 2024-05-19 DIAGNOSIS — N898 Other specified noninflammatory disorders of vagina: Secondary | ICD-10-CM

## 2024-05-19 DIAGNOSIS — F411 Generalized anxiety disorder: Secondary | ICD-10-CM

## 2024-05-19 DIAGNOSIS — F1994 Other psychoactive substance use, unspecified with psychoactive substance-induced mood disorder: Secondary | ICD-10-CM | POA: Diagnosis not present

## 2024-05-19 DIAGNOSIS — Z3403 Encounter for supervision of normal first pregnancy, third trimester: Secondary | ICD-10-CM | POA: Diagnosis not present

## 2024-05-19 NOTE — Telephone Encounter (Signed)
 Patient called and wanted an appointment sooner than late September. Requested a video visit or call from Dr. Mercy.  She is currently [redacted] weeks pregnant. Her anxiety is through the roof and she would like to know what her options are. She has a lot going on.   Thank you.

## 2024-05-19 NOTE — Progress Notes (Signed)
   PRENATAL VISIT NOTE  Subjective:  Victoria [name] is a 33 y.o. G1P0 at [redacted]w[redacted]d being seen today for ongoing prenatal care.  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  Patient reports no complaints.  Contractions: Irritability. Vag. Bleeding: None.  Movement: Present. Denies leaking of fluid.   The following portions of the patient's history were reviewed and updated as appropriate: allergies, current medications, past family history, past medical history, past social history, past surgical history and problem list.   Objective:    Vitals:   05/19/24 1053  BP: 107/66  Pulse: (!) 110  Weight: 78.7 kg    Fetal Status:  Fetal Heart Rate (bpm): 139   Movement: Present    General: Alert, oriented and cooperative. Patient is in no acute distress.  Skin: Skin is warm and dry. No rash noted.   Cardiovascular: Normal heart rate noted  Respiratory: Normal respiratory effort, no problems with respiration noted  Abdomen: Soft, gravid, appropriate for gestational age.  Pain/Pressure: Absent     Pelvic: Cervical exam deferred        Extremities: Normal range of motion.  Edema: None  Mental Status: Normal mood and affect. Normal behavior. Normal judgment and thought content.   Assessment and Plan:  Pregnancy: G1P0 at [redacted]w[redacted]d 1. Encounter for  supervision of normal first pregnancy in third trimester (Primary) Doing well. Normal BP and FHR.  2. [redacted] weeks gestation of pregnancy Follow up in 2 weeks for ROB, GBS, and GC/CT cultures.  3. Generalized anxiety disorder, r/o substance/medication-induced anxiety disorder Stable on Zoloft  100 mg daily. Seeing therapist. Patient reports a slight increase in anxiety due to upcoming baby shower and being a new mom.   4. 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  Preterm labor symptoms and general obstetric precautions including but not limited to vaginal bleeding, contractions, leaking of fluid and fetal movement were reviewed in detail with the patient. Please refer to After Visit Summary for other counseling recommendations.   No follow-ups on file.  Future Appointments  Date Time Provider Department Center  06/18/2024  3:00 PM Mercy Lauraine DELENA NI[facility] None  07/16/2024  2:00 PM Bahraini, Sarah A [facility] None   Derrek JINNY Freund, NP Student   Midwife Attestation:  I personally saw and evaluated the patient, performing the key elements of the service. I developed and verified the management plan that is described in the resident's/student's note, and I agree with the content with my edits above. VSS, HRR&R, Resp unlabored, Legs neg.    Olam Boards, CNM 7:13 PM

## 2024-05-19 NOTE — Progress Notes (Signed)
 Pt asked to give urine sample. She states that she has a fishy odor upon wakening. No itching, no discharge, no urinary symptoms. Pt states she had similar symptoms about two months ago and everything came back normal.  Pt scored 12 on PHQ; and 15 on GAD. Pt states she already sees a psychiatrist.

## 2024-05-20 ENCOUNTER — Telehealth (HOSPITAL_COMMUNITY): Payer: Self-pay | Admitting: Psychiatry

## 2024-05-20 DIAGNOSIS — F99 Mental disorder, not otherwise specified: Secondary | ICD-10-CM

## 2024-05-20 MED ORDER — SERTRALINE HCL 100 MG PO TABS: 150.0000 mg | ORAL_TABLET | Freq: Every day | ORAL | 1 refills | Status: DC

## 2024-05-20 MED ORDER — GABAPENTIN 100 MG PO CAPS: 200.0000 mg | ORAL_CAPSULE | Freq: Every day | ORAL | 1 refills | Status: DC | PRN

## 2024-05-20 NOTE — Telephone Encounter (Signed)
 Received message from front desk that patient called reporting anxiety has been through the roof.  Called patient back for approx. 15 minute phone call: She reports worsening anxiety and distress tolerance the last month as she has faced numerous stressors (family in hospital; upcoming baby shower). Worrying more about unknowns of labor and delivery. Reports that mood has been more depressed characterized by feeling low, easy irritability, low energy/motivation, decreased appetite drive (denies nausea or physical discomfort - Ob/gyn has not expressed concern around weight gain), not engaging as much in self-care. Denies excessive elevation of mood; decreased need for sleep. Denies passive/active SI. Still sleeping well.   Reports adherence to psychiatric medications - using gabapentin  about every other day but has found it less effective as pregnancy has progressed.  Reviewed increased volume of distribution in pregnancy and that it is common for women to need higher doses of their psychiatric medications as pregnancy progresses. She is amenable to further titration of Zoloft  to 150 mg daily and gabapentin  to 200 mg daily PRN acute anxiety.   Plan: -- INCREASE Zoloft  to 150 mg daily (i5/14/25, i7/30/25) -- Reviewed again today red flag signs/sx of affective switch and to reach out to this provider with any concerns; low suspicion for bipolar illness at this time -- Continue Seroquel  25 mg nightly -- INCREASE gabapentin  to 200 daily PRN acute anxiety/panic attacks -- Continue trazodone  25 mg nightly PRN sleep; counseled that patient may discontinue if no longer needed  Next appointment with this writer 06/18/24 at [facility] by video.  LAURAINE DELENA PUMMEL, MD 05/20/24

## 2024-05-21 LAB — CERVICOVAGINAL ANCILLARY ONLY
Bacterial Vaginitis (gardnerella): NEGATIVE
Comment: NEGATIVE
Comment: NEGATIVE
Comment: NEGATIVE
Comment: NEGATIVE
Comment: NEGATIVE
Comment: NORMAL

## 2024-05-23 ENCOUNTER — Ambulatory Visit: Payer: Self-pay | Admitting: Advanced Practice Midwife

## 2024-06-02 ENCOUNTER — Other Ambulatory Visit (HOSPITAL_COMMUNITY)
Admission: RE | Admit: 2024-06-02 | Discharge: 2024-06-02 | Disposition: A | Discharge status: Home / Self Care | Payer: MEDICAID | Source: Ambulatory Visit | Attending: Physician Assistant | Admitting: Physician Assistant

## 2024-06-02 ENCOUNTER — Ambulatory Visit: Payer: MEDICAID | Admitting: Physician Assistant

## 2024-06-02 VITALS — BP 112/70 | HR 99 | Wt 175.4 lb

## 2024-06-02 DIAGNOSIS — B009 Herpesviral infection, unspecified: Secondary | ICD-10-CM | POA: Diagnosis not present

## 2024-06-02 DIAGNOSIS — O98513 Other viral diseases complicating pregnancy, third trimester: Secondary | ICD-10-CM | POA: Diagnosis not present

## 2024-06-02 DIAGNOSIS — Z3A36 36 weeks gestation of pregnancy: Secondary | ICD-10-CM

## 2024-06-02 DIAGNOSIS — Z3403 Encounter for supervision of normal first pregnancy, third trimester: Secondary | ICD-10-CM

## 2024-06-02 MED ORDER — VALACYCLOVIR HCL 500 MG PO TABS: 500.0000 mg | ORAL_TABLET | Freq: Two times a day (BID) | ORAL | 0 refills | Status: DC

## 2024-06-02 NOTE — Progress Notes (Signed)
 Pt presents for rob. Pt has no questions or concerns at this time.

## 2024-06-02 NOTE — Progress Notes (Signed)
   PRENATAL VISIT NOTE  Subjective:  Victoria [name] is a 33 y.o. G1P0 at [redacted]w[redacted]d being seen today for ongoing prenatal care.  She is currently monitored for the following issues for this low-risk pregnancy and has Alcohol use disorder, severe, in early remission [facility]); PTSD (post-traumatic stress disorder); History of tobacco use; Generalized anxiety disorder, r/o substance/medication-induced anxiety disorder; Pityriasis rosea; Postinflammatory hyperpigmentation; Panic disorder; Substance or medication-induced bipolar and related disorder, r/o bipolar II disorder; Secondary insomnia; Social anxiety disorder; Supervision of normal pregnancy; HSV (herpes simplex virus) infection; History of chronic hypertension; Cannabis use disorder, moderate, in sustained remission (HCC); and History of scoliosis on their problem list.  Patient reports no complaints.  Contractions: Irritability. Vag. Bleeding: None.  Movement: Present. Denies leaking of fluid.   The following portions of the patient's history were reviewed and updated as appropriate: allergies, current medications, past family history, past medical history, past social history, past surgical history and problem list.   Objective:    Vitals:   06/02/24 1104  BP: 112/70  Pulse: 99  Weight: 175 lb 6.4 oz (79.6 kg)    Fetal Status:  Fetal Heart Rate (bpm): 138 Fundal Height: 36 cm Movement: Present    General: Alert, oriented and cooperative. Patient is in no acute distress.  Skin: Skin is warm and dry. No rash noted.   Cardiovascular: Normal heart rate noted  Respiratory: Normal respiratory effort, no problems with respiration noted  Abdomen: Soft, gravid, appropriate for gestational age.  Pain/Pressure: Present     Pelvic: Cervical exam deferred        Extremities: Normal range of motion.  Edema: None  Mental Status: Normal mood and affect. Normal behavior. Normal judgment and thought content.   Assessment and Plan:  Pregnancy:  G1P0 at [redacted]w[redacted]d 1. Encounter for supervision of normal first pregnancy in third trimester (Primary) Patient doing well, feeling regular fetal movement  BP, FHR, FH appropriate  2. [redacted] weeks gestation of pregnancy Anticipatory guidance about next visits/weeks of pregnancy given.  - Culture, beta strep (group b only) - Cervicovaginal ancillary only( [place])  3. Herpes simplex virus (HSV) infection - valACYclovir  (VALTREX ) 500 MG tablet; Take 1 tablet (500 mg total) by mouth 2 (two) times daily.  Dispense: 60 tablet; Refill: 0   Preterm labor symptoms and general obstetric precautions including but not limited to vaginal bleeding, contractions, leaking of fluid and fetal movement were reviewed in detail with the patient.  Please refer to After Visit Summary for other counseling recommendations.   Return in about 1 week (around 06/09/2024) for LOB.  Future Appointments  Date Time Provider Department Center  06/10/2024 11:15 AM Rudy Carlin LABOR, MD [facility] None  06/18/2024  3:00 PM Mercy Lauraine LABOR [facility] None  07/16/2024  2:00 PM [place], Lauraine LABOR [facility] None    Jorene FORBES Moats, PA-C

## 2024-06-03 ENCOUNTER — Other Ambulatory Visit: Payer: Self-pay | Admitting: Physician Assistant

## 2024-06-03 DIAGNOSIS — M419 Scoliosis, unspecified: Secondary | ICD-10-CM

## 2024-06-03 LAB — CERVICOVAGINAL ANCILLARY ONLY
Bacterial Vaginitis (gardnerella): NEGATIVE
Candida Glabrata: NEGATIVE
Candida Vaginitis: NEGATIVE
Chlamydia: NEGATIVE
Comment: NEGATIVE
Comment: NEGATIVE
Comment: NEGATIVE
Comment: NEGATIVE
Comment: NEGATIVE
Comment: NORMAL
Neisseria Gonorrhea: NEGATIVE
Trichomonas: NEGATIVE

## 2024-06-06 LAB — CULTURE, BETA STREP (GROUP B ONLY): Strep Gp B Culture: NEGATIVE

## 2024-06-08 ENCOUNTER — Telehealth: Payer: Self-pay

## 2024-06-08 ENCOUNTER — Other Ambulatory Visit: Payer: Self-pay | Admitting: Advanced Practice Midwife

## 2024-06-08 DIAGNOSIS — G5600 Carpal tunnel syndrome, unspecified upper limb: Secondary | ICD-10-CM

## 2024-06-08 MED ORDER — WRIST BRACE/LEFT SMALL MISC
1.0000 | Freq: Every day | 0 refills | Status: DC
Start: 2024-06-08 — End: 2024-06-20

## 2024-06-08 MED ORDER — WRIST BRACE/RIGHT SMALL MISC: 1.0000 | Freq: Every day | 0 refills | Status: DC

## 2024-06-08 NOTE — Telephone Encounter (Signed)
 Call placed to patient. Pt c/o swelling, tingling in her hands, shooting into her right arm to the elbow. She denies swelling in her legs/feet, headaches, blurred vision. She has not taken anything for pain. Pt was able to take BP at the time of call: 121/73 (HR 105). States she is unable to close her hands or pick up objects. She does do school work, which includes lots of typing. States that she has been experiencing these symptoms for a couple of weeks, but the pain woke her up out of her sleep this morning, which made her concerned. RN advised that message would be sent to a provider for further directive.

## 2024-06-08 NOTE — Progress Notes (Signed)
 Pt reports tingling, numbness in both hands, worse in the morning.  Rx for wrist braces sent to pharmacy.

## 2024-06-08 NOTE — Telephone Encounter (Signed)
 Call returned to patient. Advised that the symptoms she's experiencing is likely carpal tunnel. RN advised that prescriptions for braces had been sent to her pharmacy; however, we're unsure if insurance will cover them, so RN suggested [place] as an alternative. Pt verbalized understanding.

## 2024-06-09 ENCOUNTER — Ambulatory Visit: Payer: Self-pay | Admitting: Physician Assistant

## 2024-06-10 ENCOUNTER — Encounter: Payer: Self-pay | Admitting: Obstetrics

## 2024-06-10 ENCOUNTER — Ambulatory Visit: Payer: MEDICAID | Admitting: Obstetrics

## 2024-06-10 VITALS — BP 110/70 | HR 99 | Wt 173.4 lb

## 2024-06-10 DIAGNOSIS — Z3480 Encounter for supervision of other normal pregnancy, unspecified trimester: Secondary | ICD-10-CM | POA: Diagnosis not present

## 2024-06-10 DIAGNOSIS — F4010 Social phobia, unspecified: Secondary | ICD-10-CM | POA: Diagnosis not present

## 2024-06-10 DIAGNOSIS — Z8679 Personal history of other diseases of the circulatory system: Secondary | ICD-10-CM | POA: Diagnosis not present

## 2024-06-10 DIAGNOSIS — F4310 Post-traumatic stress disorder, unspecified: Secondary | ICD-10-CM

## 2024-06-10 DIAGNOSIS — B009 Herpesviral infection, unspecified: Secondary | ICD-10-CM | POA: Diagnosis not present

## 2024-06-10 DIAGNOSIS — G4709 Other insomnia: Secondary | ICD-10-CM

## 2024-06-10 DIAGNOSIS — F1021 Alcohol dependence, in remission: Secondary | ICD-10-CM

## 2024-06-10 NOTE — Progress Notes (Signed)
 Pt presents for rob. Pt has no questions or concerns at this time.

## 2024-06-10 NOTE — Progress Notes (Signed)
 Subjective:  Victoria [name] is a 33 y.o. G1P0 at [redacted]w[redacted]d being seen today for ongoing prenatal care.  She is currently monitored for the following issues for this low-risk pregnancy and has Alcohol use disorder, severe, in early remission [facility]); PTSD (post-traumatic stress disorder); History of tobacco use; Generalized anxiety disorder, r/o substance/medication-induced anxiety disorder; Pityriasis rosea; Postinflammatory hyperpigmentation; Panic disorder; Substance or medication-induced bipolar and related disorder, r/o bipolar II disorder; Secondary insomnia; Social anxiety disorder; Supervision of normal pregnancy; HSV (herpes simplex virus) infection; History of chronic hypertension; Cannabis use disorder, moderate, in sustained remission (HCC); and History of scoliosis on their problem list.  Patient reports no complaints.  Contractions: Not present. Vag. Bleeding: None.  Movement: Present. Denies leaking of fluid.   The following portions of the patient's history were reviewed and updated as appropriate: allergies, current medications, past family history, past medical history, past social history, past surgical history and problem list. Problem list updated.  Objective:   Vitals:   06/10/24 1126  BP: 110/70  Pulse: 99  Weight: 173 lb 6.4 oz (78.7 kg)    Fetal Status: Fetal Heart Rate (bpm): 138   Movement: Present     General:  Alert, oriented and cooperative. Patient is in no acute distress.  Skin: Skin is warm and dry. No rash noted.   Cardiovascular: Normal heart rate noted  Respiratory: Normal respiratory effort, no problems with respiration noted  Abdomen: Soft, gravid, appropriate for gestational age. Pain/Pressure: Present     Pelvic:  Cervical exam deferred        Extremities: Normal range of motion.  Edema: Trace (hands)  Mental Status: Normal mood and affect. Normal behavior. Normal judgment and thought content.   Urinalysis:      Assessment and Plan:  Pregnancy:  G1P0 at [redacted]w[redacted]d  1. Supervision of other normal pregnancy, antepartum (Primary)  2. History of chronic hypertension  3. HSV (herpes simplex virus) infection - Valtrex  suppression  4. Social anxiety disorder - clinically stable  5. Secondary insomnia - clinically stable  6. Alcohol use disorder, severe, in early remission (HCC) - clinically stable  7. PTSD (post-traumatic stress disorder) - clinically stable    Term labor symptoms and general obstetric precautions including but not limited to vaginal bleeding, contractions, leaking of fluid and fetal movement were reviewed in detail with the patient. Please refer to After Visit Summary for other counseling recommendations.   Return in about 1 week (around 06/17/2024) for ROB.   Rudy Carlin LABOR, MD 06/10/2024

## 2024-06-11 ENCOUNTER — Ambulatory Visit (HOSPITAL_COMMUNITY)
Admission: RE | Admit: 2024-06-11 | Discharge: 2024-06-11 | Disposition: A | Discharge status: Home / Self Care | Payer: MEDICAID | Source: Ambulatory Visit | Attending: Physician Assistant | Admitting: Physician Assistant

## 2024-06-11 DIAGNOSIS — M419 Scoliosis, unspecified: Secondary | ICD-10-CM | POA: Insufficient documentation

## 2024-06-15 ENCOUNTER — Ambulatory Visit: Payer: MEDICAID | Admitting: Obstetrics and Gynecology

## 2024-06-15 VITALS — BP 112/76 | HR 114 | Wt 173.0 lb

## 2024-06-15 DIAGNOSIS — B009 Herpesviral infection, unspecified: Secondary | ICD-10-CM | POA: Diagnosis not present

## 2024-06-15 DIAGNOSIS — Z8679 Personal history of other diseases of the circulatory system: Secondary | ICD-10-CM | POA: Diagnosis not present

## 2024-06-15 DIAGNOSIS — Z3480 Encounter for supervision of other normal pregnancy, unspecified trimester: Secondary | ICD-10-CM

## 2024-06-15 DIAGNOSIS — F1994 Other psychoactive substance use, unspecified with psychoactive substance-induced mood disorder: Secondary | ICD-10-CM

## 2024-06-15 DIAGNOSIS — Z8739 Personal history of other diseases of the musculoskeletal system and connective tissue: Secondary | ICD-10-CM | POA: Diagnosis not present

## 2024-06-15 NOTE — Progress Notes (Signed)
   PRENATAL VISIT NOTE  Subjective:  Victoria [name] is a 33 y.o. G1P0 at [redacted]w[redacted]d being seen today for ongoing prenatal care.  She is currently monitored for the following issues for this low-risk pregnancy and has Alcohol use disorder, severe, in early remission [facility]); PTSD (post-traumatic stress disorder); History of tobacco use; Generalized anxiety disorder, r/o substance/medication-induced anxiety disorder; Pityriasis rosea; Postinflammatory hyperpigmentation; Panic disorder; Substance or medication-induced bipolar and related disorder, r/o bipolar II disorder; Secondary insomnia; Social anxiety disorder; Supervision of normal pregnancy; HSV (herpes simplex virus) infection; History of chronic hypertension; Cannabis use disorder, moderate, in sustained remission (HCC); and History of scoliosis on their problem list.  Patient reports carpal tunnel symptoms.  Contractions: Not present. Vag. Bleeding: None.  Movement: Present. Denies leaking of fluid.   The following portions of the patient's history were reviewed and updated as appropriate: allergies, current medications, past family history, past medical history, past social history, past surgical history and problem list.   Objective:    Vitals:   06/15/24 1441  BP: 112/76  Pulse: (!) 114  Weight: 78.5 kg    Fetal Status:  Fetal Heart Rate (bpm): 147 Fundal Height: 37 cm Movement: Present    General: Alert, oriented and cooperative. Patient is in no acute distress.  Skin: Skin is warm and dry. No rash noted.   Cardiovascular: Normal heart rate noted  Respiratory: Normal respiratory effort, no problems with respiration noted  Abdomen: Soft, gravid, appropriate for gestational age.  Pain/Pressure: Present     Pelvic: Cervical exam performed in the presence of a chaperone Dilation: Closed Effacement (%): Thick Station: -3  Extremities: Normal range of motion.  Edema: Trace  Mental Status: Normal mood and affect. Normal behavior.  Normal judgment and thought content.   Assessment and Plan:  Pregnancy: G1P0 at [redacted]w[redacted]d 1. Supervision of other normal pregnancy, antepartum (Primary) Feeling good FM, no concerns. Discussed labor readiness, IOL process and postdates BPP if needed. She would like a membrane sweep next visit if possible.   2. History of chronic hypertension BP WNL, taking BASA   3. HSV (herpes simplex virus) infection Taking Valacyclovir  daily. No concerning symptoms   4. History of scoliosis Had MRI 8/21. Results still pending. Advised someone will reach out when results are finalized & implications for epidural   5. 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 Please refer to After Visit Summary for other counseling recommendations.   Return in 1 week (on 06/22/2024) for ROB would like membrane sweep .  Future Appointments  Date Time Provider Department Center  06/18/2024  3:00 PM Mercy Lauraine LABOR [facility] None  06/25/2024  3:10 PM Fredirick Glenys RAMAN, MD [facility] None  07/16/2024  2:00 PM Mercy Lauraine LABOR [facility] None    Elenor Mole, [facility] 06/15/24

## 2024-06-15 NOTE — Progress Notes (Signed)
 Swelling in hands with tingling all of the time. Has not picked up wrist bandages yet. Denies other concerns. Wants cx check.

## 2024-06-15 NOTE — Patient Instructions (Signed)
 Things to Try After 37 weeks to Encourage Labor/Get Ready for Labor:    Try the [employer] at [email or web address] daily to improve baby's position and encourage the onset of labor.  Walk a little and rest a little every day.  Change positions often.  Cervical Ripening: May try one or both Red Raspberry Leaf capsules or tea:  two 300mg  or 400mg  tablets with each meal, 2-3 times a day, or 1-3 cups of tea daily  Potential Side Effects Of Raspberry Leaf:  Most women do not experience any side effects from drinking raspberry leaf tea. However, nausea and loose stools are possible   Evening Primrose Oil capsules: take 1 capsule by mouth and place one capsule in the vagina every night.    Some of the potential side effects:  Upset stomach  Loose stools or diarrhea  Headaches  Nausea  Sex can also help the cervix ripen and encourage labor onset.
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
## 2024-06-18 ENCOUNTER — Telehealth (INDEPENDENT_AMBULATORY_CARE_PROVIDER_SITE_OTHER): Payer: MEDICAID | Admitting: Psychiatry

## 2024-06-18 ENCOUNTER — Encounter (HOSPITAL_COMMUNITY): Payer: Self-pay | Admitting: Psychiatry

## 2024-06-18 DIAGNOSIS — F1021 Alcohol dependence, in remission: Secondary | ICD-10-CM | POA: Diagnosis not present

## 2024-06-18 DIAGNOSIS — F3181 Bipolar II disorder: Secondary | ICD-10-CM

## 2024-06-18 DIAGNOSIS — F3341 Major depressive disorder, recurrent, in partial remission: Secondary | ICD-10-CM | POA: Insufficient documentation

## 2024-06-18 DIAGNOSIS — F4310 Post-traumatic stress disorder, unspecified: Secondary | ICD-10-CM

## 2024-06-18 DIAGNOSIS — F331 Major depressive disorder, recurrent, moderate: Secondary | ICD-10-CM | POA: Insufficient documentation

## 2024-06-18 DIAGNOSIS — F4010 Social phobia, unspecified: Secondary | ICD-10-CM | POA: Diagnosis not present

## 2024-06-18 DIAGNOSIS — F99 Mental disorder, not otherwise specified: Secondary | ICD-10-CM

## 2024-06-18 MED ORDER — GABAPENTIN 100 MG PO CAPS: 200.0000 mg | ORAL_CAPSULE | Freq: Every day | ORAL | 1 refills | Status: DC | PRN

## 2024-06-18 MED ORDER — QUETIAPINE FUMARATE 25 MG PO TABS: 25.0000 mg | ORAL_TABLET | Freq: Every day | ORAL | 1 refills | Status: DC

## 2024-06-18 MED ORDER — SERTRALINE HCL 100 MG PO TABS
150.0000 mg | ORAL_TABLET | Freq: Every day | ORAL | 1 refills | Status: DC
Start: 2024-06-18 — End: 2024-09-08

## 2024-06-18 MED ORDER — TRAZODONE HCL 50 MG PO TABS: 25.0000 mg | ORAL_TABLET | Freq: Every evening | ORAL | 1 refills | Status: DC | PRN

## 2024-06-18 NOTE — Patient Instructions (Signed)

## 2024-06-20 ENCOUNTER — Encounter (HOSPITAL_COMMUNITY): Payer: Self-pay

## 2024-06-20 ENCOUNTER — Other Ambulatory Visit: Payer: Self-pay

## 2024-06-20 ENCOUNTER — Encounter (HOSPITAL_COMMUNITY): Payer: Self-pay | Admitting: Anesthesiology

## 2024-06-20 ENCOUNTER — Encounter (HOSPITAL_COMMUNITY): Payer: Self-pay | Admitting: Emergency Medicine

## 2024-06-20 ENCOUNTER — Inpatient Hospital Stay (HOSPITAL_COMMUNITY)
Admission: EM | Admit: 2024-06-20 | Discharge: 2024-06-24 | DRG: 806 | Disposition: A | Discharge status: Home / Self Care | Payer: MEDICAID | Attending: Obstetrics and Gynecology | Admitting: Obstetrics and Gynecology

## 2024-06-20 DIAGNOSIS — O368130 Decreased fetal movements, third trimester, not applicable or unspecified: Secondary | ICD-10-CM | POA: Diagnosis present

## 2024-06-20 DIAGNOSIS — O368190 Decreased fetal movements, unspecified trimester, not applicable or unspecified: Secondary | ICD-10-CM | POA: Diagnosis present

## 2024-06-20 DIAGNOSIS — O99344 Other mental disorders complicating childbirth: Secondary | ICD-10-CM | POA: Diagnosis present

## 2024-06-20 DIAGNOSIS — Z3A38 38 weeks gestation of pregnancy: Secondary | ICD-10-CM | POA: Diagnosis not present

## 2024-06-20 DIAGNOSIS — Z30017 Encounter for initial prescription of implantable subdermal contraceptive: Secondary | ICD-10-CM | POA: Diagnosis not present

## 2024-06-20 DIAGNOSIS — Z8249 Family history of ischemic heart disease and other diseases of the circulatory system: Secondary | ICD-10-CM | POA: Diagnosis not present

## 2024-06-20 DIAGNOSIS — F3341 Major depressive disorder, recurrent, in partial remission: Secondary | ICD-10-CM | POA: Diagnosis present

## 2024-06-20 DIAGNOSIS — F4310 Post-traumatic stress disorder, unspecified: Secondary | ICD-10-CM | POA: Diagnosis present

## 2024-06-20 DIAGNOSIS — F259 Schizoaffective disorder, unspecified: Secondary | ICD-10-CM | POA: Diagnosis present

## 2024-06-20 DIAGNOSIS — Z79899 Other long term (current) drug therapy: Secondary | ICD-10-CM

## 2024-06-20 DIAGNOSIS — O9962 Diseases of the digestive system complicating childbirth: Secondary | ICD-10-CM | POA: Diagnosis present

## 2024-06-20 DIAGNOSIS — K219 Gastro-esophageal reflux disease without esophagitis: Secondary | ICD-10-CM | POA: Diagnosis present

## 2024-06-20 DIAGNOSIS — M419 Scoliosis, unspecified: Secondary | ICD-10-CM | POA: Diagnosis present

## 2024-06-20 DIAGNOSIS — Z8739 Personal history of other diseases of the musculoskeletal system and connective tissue: Secondary | ICD-10-CM

## 2024-06-20 DIAGNOSIS — Z7982 Long term (current) use of aspirin: Secondary | ICD-10-CM | POA: Diagnosis not present

## 2024-06-20 DIAGNOSIS — A6000 Herpesviral infection of urogenital system, unspecified: Secondary | ICD-10-CM | POA: Diagnosis present

## 2024-06-20 DIAGNOSIS — Z87891 Personal history of nicotine dependence: Secondary | ICD-10-CM

## 2024-06-20 DIAGNOSIS — O9832 Other infections with a predominantly sexual mode of transmission complicating childbirth: Secondary | ICD-10-CM | POA: Diagnosis present

## 2024-06-20 DIAGNOSIS — B009 Herpesviral infection, unspecified: Secondary | ICD-10-CM | POA: Diagnosis present

## 2024-06-20 DIAGNOSIS — Z833 Family history of diabetes mellitus: Secondary | ICD-10-CM

## 2024-06-20 DIAGNOSIS — F331 Major depressive disorder, recurrent, moderate: Secondary | ICD-10-CM | POA: Diagnosis present

## 2024-06-20 DIAGNOSIS — F4010 Social phobia, unspecified: Secondary | ICD-10-CM | POA: Diagnosis present

## 2024-06-20 LAB — CBC
HCT: 38.2 % (ref 36.0–46.0)
Hemoglobin: 12.4 g/dL (ref 12.0–15.0)
MCH: 26.3 pg (ref 26.0–34.0)
MCHC: 32.5 g/dL (ref 30.0–36.0)
MCV: 80.9 fL (ref 80.0–100.0)
Platelets: 157 K/uL (ref 150–400)
RBC: 4.72 MIL/uL (ref 3.87–5.11)
RDW: 14.6 % (ref 11.5–15.5)
WBC: 7.4 K/uL (ref 4.0–10.5)
nRBC: 0 % (ref 0.0–0.2)

## 2024-06-20 LAB — TYPE AND SCREEN
ABO/RH(D): O POS
Antibody Screen: NEGATIVE

## 2024-06-20 LAB — RPR: RPR Ser Ql: NONREACTIVE

## 2024-06-20 MED ORDER — OXYCODONE-ACETAMINOPHEN 5-325 MG PO TABS: 1.0000 | ORAL_TABLET | ORAL | Status: DC | PRN

## 2024-06-20 MED ORDER — LACTATED RINGERS IV SOLN: 500.0000 mL | INTRAVENOUS | Status: AC | PRN

## 2024-06-20 MED ORDER — ACETAMINOPHEN 325 MG PO TABS
650.0000 mg | ORAL_TABLET | ORAL | Status: DC | PRN
  Administered 2024-06-22: 650 mg via ORAL
  Filled 2024-06-20: qty 2

## 2024-06-20 MED ORDER — OXYTOCIN-SODIUM CHLORIDE 30-0.9 UT/500ML-% IV SOLN: 1.0000 m[IU]/min | INTRAVENOUS | Status: DC

## 2024-06-20 MED ORDER — VALACYCLOVIR HCL 500 MG PO TABS: 500.0000 mg | ORAL_TABLET | Freq: Two times a day (BID) | ORAL | Status: DC

## 2024-06-20 MED ORDER — OXYTOCIN-SODIUM CHLORIDE 30-0.9 UT/500ML-% IV SOLN
2.5000 [IU]/h | INTRAVENOUS | Status: DC
  Filled 2024-06-20 (×2): qty 500

## 2024-06-20 MED ORDER — SOD CITRATE-CITRIC ACID 500-334 MG/5ML PO SOLN: 30.0000 mL | ORAL | Status: DC | PRN

## 2024-06-20 MED ORDER — MISOPROSTOL 50MCG HALF TABLET
50.0000 ug | ORAL_TABLET | ORAL | Status: DC | PRN
  Administered 2024-06-20 – 2024-06-21 (×3): 50 ug via ORAL
  Filled 2024-06-20 (×2): qty 1

## 2024-06-20 MED ORDER — VALACYCLOVIR HCL 500 MG PO TABS: 500.0000 mg | ORAL_TABLET | Freq: Every day | ORAL | Status: DC

## 2024-06-20 MED ORDER — MISOPROSTOL 25 MCG QUARTER TABLET
25.0000 ug | ORAL_TABLET | Freq: Once | ORAL | Status: AC
  Administered 2024-06-20: 25 ug via VAGINAL
  Filled 2024-06-20 (×2): qty 1

## 2024-06-20 MED ORDER — VALACYCLOVIR HCL 500 MG PO TABS
500.0000 mg | ORAL_TABLET | Freq: Two times a day (BID) | ORAL | Status: DC
  Administered 2024-06-20 – 2024-06-22 (×3): 500 mg via ORAL
  Filled 2024-06-20 (×3): qty 1

## 2024-06-20 MED ORDER — SERTRALINE HCL 50 MG PO TABS
150.0000 mg | ORAL_TABLET | Freq: Every day | ORAL | Status: DC
  Administered 2024-06-21 – 2024-06-24 (×4): 150 mg via ORAL
  Filled 2024-06-20: qty 3
  Filled 2024-06-20: qty 1
  Filled 2024-06-20: qty 3
  Filled 2024-06-20 (×2): qty 1

## 2024-06-20 MED ORDER — TRAZODONE 25 MG HALF TABLET
25.0000 mg | ORAL_TABLET | Freq: Every evening | ORAL | Status: DC | PRN
  Administered 2024-06-23: 25 mg via ORAL
  Filled 2024-06-20: qty 1
  Filled 2024-06-20: qty 2

## 2024-06-20 MED ORDER — QUETIAPINE FUMARATE 25 MG PO TABS
25.0000 mg | ORAL_TABLET | Freq: Every day | ORAL | Status: DC
  Administered 2024-06-20 – 2024-06-23 (×3): 25 mg via ORAL
  Filled 2024-06-20 (×6): qty 1

## 2024-06-20 MED ORDER — OXYCODONE-ACETAMINOPHEN 5-325 MG PO TABS: 2.0000 | ORAL_TABLET | ORAL | Status: DC | PRN

## 2024-06-20 MED ORDER — MISOPROSTOL 50MCG HALF TABLET
50.0000 ug | ORAL_TABLET | Freq: Once | ORAL | Status: DC
  Filled 2024-06-20: qty 1

## 2024-06-20 MED ORDER — LIDOCAINE HCL (PF) 1 % IJ SOLN: 30.0000 mL | INTRAMUSCULAR | Status: DC | PRN

## 2024-06-20 MED ORDER — ONDANSETRON HCL 4 MG/2ML IJ SOLN: 4.0000 mg | Freq: Four times a day (QID) | INTRAMUSCULAR | Status: DC | PRN

## 2024-06-20 MED ORDER — TERBUTALINE SULFATE 1 MG/ML IJ SOLN: 0.2500 mg | Freq: Once | INTRAMUSCULAR | Status: DC | PRN

## 2024-06-20 MED ORDER — LACTATED RINGERS IV BOLUS
1000.0000 mL | Freq: Once | INTRAVENOUS | Status: AC
  Administered 2024-06-20: 1000 mL via INTRAVENOUS

## 2024-06-20 MED ORDER — OXYTOCIN BOLUS FROM INFUSION
333.0000 mL | Freq: Once | INTRAVENOUS | Status: DC
Start: 2024-06-20 — End: 2024-06-22

## 2024-06-20 MED ORDER — LACTATED RINGERS IV SOLN: INTRAVENOUS | Status: AC

## 2024-06-20 NOTE — ED Notes (Signed)
 [facility] here for transport of pt to [facility]

## 2024-06-20 NOTE — ED Triage Notes (Signed)
 Pt states she hasn't felt any movement with her fetus since 5pm yesterday. States she woke up around 4 am and had some water and ice cream to see if it would stimulate baby to move but did not. States pregnancy so far has been normal.

## 2024-06-20 NOTE — ED Notes (Signed)
 OB Rapid Response Nurse Paged at this time.

## 2024-06-20 NOTE — H&P (Addendum)
 OBSTETRIC ADMISSION HISTORY AND PHYSICAL  Victoria [name] is 33 y.o. G1P0 with IUP at [redacted]w[redacted]d 06/30/2024, by Ultrasound presenting for decreased fetal movement. She received her prenatal care at [facility]   ROS (+) FM,  (-) Cx, VB, LOF. HA, visual changes, CP, SOB, RUQ pain, peripheral edema.   Prenatal History/Complications        NURSING  PROVIDER  Office Location [facility] Dating by U/S at 9 wks  [facility] Model Traditional Anatomy U/S EIF, otherwise normal 'Victoria [name]'   Initiated care at  13wks                 Language  English               LAB RESULTS   Support Person   Genetics NIPS: low risk female  AFP:       NT/IT (FT only) Neg/neg      Carrier Screen Horizon: declined  Rhogam  O/Positive/-- (03/04 1145) A1C/GTT Early HgbA1C: 5.7>GTT wnl Third trimester 2 hr GTT: 88/97/118  Flu Vaccine        TDaP Vaccine  04/20/24 Blood Type O/Positive/-- (03/04 1145)  RSV Vaccine   Antibody Negative (03/04 1145)  COVID Vaccine   Rubella 4.70 (03/04 1145)  Feeding Plan breast RPR Non Reactive (03/04 1145)  Contraception Nexp at hosp HBsAg Negative (03/04 1145)  Circumcision Yes HIV Non Reactive (03/04 1145)  Pediatrician  List given HCVAb Non Reactive (03/04 1145)  Prenatal Classes        BTL Consent   Pap 12/24/23: neg  BTL Pre-payment   GC/CT Initial:  -/- 36wks:    VBAC Consent   GBS For PCN allergy, check sensitivities   BRx Optimized? [ ]  yes   [x]  no      DME Rx [ ]  BP cuff [ ]  Weight Scale Waterbirth  [ ]  Class [ ]  Consent [ ]  CNM visit  PHQ9 & GAD7 [  ] new OB [  ] 28 weeks  [  ] 36 weeks Induction  [ ]  Orders Entered [ ] Foley Y/N   OB History  Gravida Para Term Preterm AB Living  1       SAB IAB Ectopic Multiple Live Births          # Outcome Date GA Lbr Len/2nd Weight Sex Type Anes PTL Lv  1 Current            Patient Active Problem List   Diagnosis Date Noted   MDD (major depressive disorder), recurrent, in partial remission (HCC) 06/18/2024   History of  scoliosis 04/22/2024   Supervision of normal pregnancy 12/24/2023   HSV (herpes simplex virus) infection 12/24/2023   History of chronic hypertension 12/24/2023   Social anxiety disorder 06/26/2023   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   Pityriasis rosea 11/16/2021   Postinflammatory hyperpigmentation 11/16/2021   History of tobacco use 07/11/2017   PTSD (post-traumatic stress disorder) 07/10/2017   Alcohol use disorder, severe, in early remission (HCC) 07/08/2017    Past Medical History: Past Medical History:  Diagnosis Date   Anxiety    Carpal tunnel syndrome of right wrist 06/10/2019   Depression    Genital herpes    HPV (human papilloma virus) anogenital infection    Hypertension 06/22/2017   Migraines    PTSD (post-traumatic stress disorder)     Past Surgical History: Past Surgical History:  Procedure Laterality Date   BACK SURGERY  2011   scoliosis repair   DG  SCOLIOSIS SERIES ([facility] HX)      Social History Social History   Socioeconomic History   Marital status: Single    Spouse name: Not on file   Number of children: Not on file   Years of education: Not on file   Highest education level: Not on file  Occupational History   Not on file  Tobacco Use   Smoking status: Former    Current packs/day: 0.50    Average packs/day: 0.5 packs/day for 9.0 years (4.5 ttl pk-yrs)    Types: E-cigarettes, Cigarettes   Smokeless tobacco: Never  Vaping Use   Vaping status: Former  Substance and Sexual Activity   Alcohol use: Not Currently   Drug use: Not Currently    Types: Marijuana   Sexual activity: Yes    Birth control/protection: None  Other Topics Concern   Not on file  Social History Narrative   Not on file   Social Drivers of Health   Financial Resource Strain: Low Risk  (12/24/2023)   Overall Financial Resource Strain (CARDIA)    Difficulty of Paying Living Expenses: Not hard at all  Food Insecurity: No Food Insecurity (12/24/2023)   Hunger Vital Sign     Worried About Running Out of Food in the Last Year: Never true    Ran Out of Food in the Last Year: Never true  Transportation Needs: No Transportation Needs (12/24/2023)   PRAPARE - Administrator, Civil Service (Medical): No    Lack of Transportation (Non-Medical): No  Physical Activity: Inactive (12/24/2023)   Exercise Vital Sign    Days of Exercise per Week: 2 days    Minutes of Exercise per Session: 0 min  Stress: No Stress Concern Present (12/24/2023)   [employer] of Occupational Health - Occupational Stress Questionnaire    Feeling of Stress : Only a little  Social Connections: Moderately Integrated (12/24/2023)   Social Connection and Isolation Panel    Frequency of Communication with Friends and Family: Three times a week    Frequency of Social Gatherings with Friends and Family: Three times a week    Attends Religious Services: 1 to 4 times per year    Active Member of Clubs or Organizations: No    Attends Banker Meetings: Never    Marital Status: Living with partner    Family History: Family History  Problem Relation Age of Onset   Healthy Mother    Hypertension Father    Multiple sclerosis Sister    Diabetes Maternal Aunt    Diabetes Maternal Uncle    Schizophrenia Maternal Grandmother    Hypertension Paternal Grandfather     Allergies: Allergies  Allergen Reactions   Lurasidone  Hcl Rash    Patient reports developing facial rash while taking lurasidone  with notable improvement after stopping    Medications Prior to Admission  Medication Sig Dispense Refill Last Dose/Taking   aspirin  EC 81 MG tablet Take 1 tablet (81 mg total) by mouth daily. Swallow whole. 90 tablet 3 06/19/2024   cyclobenzaprine  (FLEXERIL ) 10 MG tablet Take 1 tablet (10 mg total) by mouth every 8 (eight) hours as needed for muscle spasms. 30 tablet 1 Past Month   gabapentin  (NEURONTIN ) 100 MG capsule Take 2 capsules (200 mg total) by mouth daily as needed (acute  anxiety). 180 capsule 1 06/19/2024   Prenatal Vit-Fe Fumarate-FA (M-NATAL PLUS) 27-1 MG TABS Take 1 tablet by mouth daily.   06/19/2024   QUEtiapine  (SEROQUEL ) 25 MG tablet Take  1 tablet (25 mg total) by mouth at bedtime. 90 tablet 1 06/19/2024   sertraline  (ZOLOFT ) 100 MG tablet Take 1.5 tablets (150 mg total) by mouth daily. 135 tablet 1 06/19/2024   traZODone  (DESYREL ) 50 MG tablet Take 0.5-1 tablets (25-50 mg total) by mouth at bedtime as needed for sleep. 90 tablet 1 06/19/2024   valACYclovir  (VALTREX ) 500 MG tablet Take 1 tablet (500 mg total) by mouth 2 (two) times daily. 60 tablet 0 06/19/2024   diclofenac  Sodium (VOLTAREN ) 1 % GEL Apply 2 g topically 4 (four) times daily. (Patient not taking: Reported on 06/10/2024) 100 g 0    Elastic Bandages & Supports (WRIST BRACE/LEFT SMALL) MISC 1 Device by Does not apply route daily. (Patient not taking: Reported on 06/15/2024) 1 each 0    Elastic Bandages & Supports (WRIST BRACE/RIGHT SMALL) MISC 1 Device by Does not apply route daily. (Patient not taking: Reported on 06/15/2024) 1 each 0    lidocaine  (LIDODERM ) 5 % Place 1 patch onto the skin daily. Remove & Discard patch within 12 hours or as directed by MD (Patient not taking: Reported on 06/10/2024) 30 patch 0    Prenatal Vit-Fe Fumarate-FA (MULTIVITAMIN-PRENATAL) 27-0.8 MG TABS tablet Take 1 tablet by mouth daily at 12 noon. (Patient not taking: Reported on 06/10/2024) 90 tablet 4    promethazine  (PHENERGAN ) 25 MG tablet Take 0.5-1 tablets (12.5-25 mg total) by mouth every 6 (six) hours as needed. (Patient not taking: Reported on 06/15/2024) 30 tablet 6      Review of Systems  All systems reviewed and negative except as stated in HPI  PHYSICAL EXAM Blood pressure 115/83, pulse (!) 103, temperature 98.3 F (36.8 C), temperature source Oral, resp. rate 18, height 5' 4 (1.626 m), weight 78.5 kg, last menstrual period 08/29/2023, SpO2 98%. General appearance: alert, cooperative, and no distress Lungs:  respirations nonlabored Heart: regular rate Abdomen: gravid  Pelvic exam: vulva and labia visualized without any lesions, sterile speculum exam conducted with no vaginal or cervical lesions appreciated.   Fetal monitoringBaseline: 135 bpm, Variability: Good {> 6 bpm), Accelerations: Reactive, and Decelerations: Absent Uterine activityNone    Presentation: cephalic   Prenatal labs: ABO, Rh: O/Positive/-- (03/04 1145) Antibody: Negative (06/11 0833) Rubella: 4.70 (03/04 1145) RPR: Non Reactive (06/11 0833)  HBsAg: Negative (03/04 1145)  HIV: Non Reactive (06/11 0833)  GBS: Negative/-- (08/12 1204)    Lab Results  Component Value Date   GBS Negative 06/02/2024   Maternal Diabetes: No Genetic Screening: Normal Anatomy US : EIF, otherwise normal  Immunization History  Administered Date(s) Administered   Influenza,inj,Quad PF,6+ Mos 07/10/2017   Tdap 04/20/2024    Prenatal Transfer Tool  Maternal Diabetes: No Genetic Screening: Normal Maternal Ultrasounds/Referrals: Normal Fetal Ultrasounds or other Referrals:  None Maternal Substance Abuse:  No Significant Maternal Medications:  Meds include: Zoloft  Other: trazodone , gabapentin , seroquel  Significant Maternal Lab Results: None Number of Prenatal Visits:greater than 3 verified prenatal visits Maternal Vaccinations:TDap Other Comments:  None   No results found for this or any previous visit (from the past 24 hours).  Patient Active Problem List   Diagnosis Date Noted   MDD (major depressive disorder), recurrent, in partial remission (HCC) 06/18/2024   History of scoliosis 04/22/2024   Supervision of normal pregnancy 12/24/2023   HSV (herpes simplex virus) infection 12/24/2023   History of chronic hypertension 12/24/2023   Social anxiety disorder 06/26/2023   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   Pityriasis rosea 11/16/2021   Postinflammatory hyperpigmentation 11/16/2021  History of tobacco use 07/11/2017   PTSD  (post-traumatic stress disorder) 07/10/2017   Alcohol use disorder, severe, in early remission [facility]) 07/08/2017    ASSESSMENT & PLAN Jahmia Berrett is 33 y.o. G1P0 with IUP at [redacted]w[redacted]d 06/30/2024, by Ultrasound admitted for decreased fetal movement.  Sono @[redacted]w[redacted]d , CWD, normal anatomy, cephalic presentation, anterior placenta, EFW 248g, (24%)  #Labor: Induction, will start with vaginal cytotec . Plan for FB > AROM > pitocin  prn.   #Pain: Would like epidural, d/w anesthesia. Harrington rods in place, MRI earlier this month. Discussed N2O, IV, and nonpharm techniques.  #FWB: Cat 1, reassuring  #HSV hx:  - primary infection age 55, no other outbreaks - no prodrome or lesions during pregnancy - Valtrex  suppression  #GBS status:  negative #Feeding: Breastmilk  #Reproductive Life planning: Nexplanon  #Circ:  yes  Buel Avers, MD PGY2 family medicine 06/20/2024

## 2024-06-20 NOTE — Progress Notes (Signed)
 Called Dr Jayne to discuss pt. Dr Jayne states since she's had decreased FM for >12hrs, she needs to be transferred directly to L&D for IOL.  Updated L&D Charge RN-Johnette that Dr Jayne is admitting pt to L&D. Pt can go to room 2S11.   Updated [facility] ED RN.

## 2024-06-20 NOTE — MAU Provider Note (Signed)
Please refer to H & P for admission details.    UGONNA  ANYANWU, MD, FACOG Attending Obstetrician & Gynecologist Faculty Practice, [facility]   

## 2024-06-20 NOTE — ED Provider Notes (Signed)
 [place] EMERGENCY DEPARTMENT AT [facility] Provider Note   CSN: [id number] Arrival date & time: 06/20/24  9490     Patient presents with: No chief complaint on file.   Victoria [name] is a 33 y.o. female.   HPI     This is a 33 year old G1, P0 female at 38w and 4 days pregnant who presents with decreased fetal movement.  Patient reports that she has had any fetal movement in the last 12 hours.  Last noted fetal movement yesterday around 5 PM.  She woke up at 4 AM and drink some water and ate some ice cream to try to stimulate the baby but did not have any noticeable change.  She denies any abdominal pain or contractions.  No loss of fluids.  Patient denies any pregnancy related complications.  Prior to Admission medications   Medication Sig Start Date End Date Taking? Authorizing Provider  aspirin  EC 81 MG tablet Take 1 tablet (81 mg total) by mouth daily. Swallow whole. 03/04/24   Ozan, Jennifer, DO  cyclobenzaprine  (FLEXERIL ) 10 MG tablet Take 1 tablet (10 mg total) by mouth every 8 (eight) hours as needed for muscle spasms. 03/04/24   Ozan, Jennifer, DO  diclofenac  Sodium (VOLTAREN ) 1 % GEL Apply 2 g topically 4 (four) times daily. Patient not taking: Reported on 06/10/2024 04/23/24   Zelaya, Oscar A, PA-C  Elastic Bandages & Supports (WRIST BRACE/LEFT SMALL) MISC 1 Device by Does not apply route daily. Patient not taking: Reported on 06/15/2024 06/08/24   Milly Olam LABOR, CNM  Elastic Bandages & Supports (WRIST BRACE/RIGHT SMALL) MISC 1 Device by Does not apply route daily. Patient not taking: Reported on 06/15/2024 06/08/24   Milly Olam LABOR, CNM  gabapentin  (NEURONTIN ) 100 MG capsule Take 2 capsules (200 mg total) by mouth daily as needed (acute anxiety). 06/18/24 12/15/24  Bahraini, Sarah A  lidocaine  (LIDODERM ) 5 % Place 1 patch onto the skin daily. Remove & Discard patch within 12 hours or as directed by MD Patient not taking: Reported on 06/10/2024  04/23/24   Zelaya, Oscar A, PA-C  Prenatal Vit-Fe Fumarate-FA (M-NATAL PLUS) 27-1 MG TABS Take 1 tablet by mouth daily. 01/24/24   [provider]  Prenatal Vit-Fe Fumarate-FA (MULTIVITAMIN-PRENATAL) 27-0.8 MG TABS tablet Take 1 tablet by mouth daily at 12 noon. Patient not taking: Reported on 06/10/2024 01/22/24   Loreli Suzen BIRCH, CNM  promethazine  (PHENERGAN ) 25 MG tablet Take 0.5-1 tablets (12.5-25 mg total) by mouth every 6 (six) hours as needed. Patient not taking: Reported on 06/15/2024 12/24/23   Kizzie Suzen SAUNDERS, CNM  QUEtiapine  (SEROQUEL ) 25 MG tablet Take 1 tablet (25 mg total) by mouth at bedtime. 06/18/24 12/15/24  Bahraini, Sarah A  sertraline  (ZOLOFT ) 100 MG tablet Take 1.5 tablets (150 mg total) by mouth daily. 06/18/24   Bahraini, Sarah A  traZODone  (DESYREL ) 50 MG tablet Take 0.5-1 tablets (25-50 mg total) by mouth at bedtime as needed for sleep. 06/18/24   B[place]raini, Sarah A  valACYclovir  (VALTREX ) 500 MG tablet Take 1 tablet (500 mg total) by mouth 2 (two) times daily. 06/02/24   Davis, Devon E, PA-C  rizatriptan (MAXALT-MLT) 5 MG disintegrating tablet Take 5 mg by mouth daily as needed for migraine. 07/15/18 06/05/19  [provider]    Allergies: Lurasidone  hcl    Review of Systems  Constitutional:  Negative for fever.  Respiratory:  Negative for shortness of breath.   Cardiovascular:  Negative for chest pain.  Gastrointestinal:  Negative for abdominal pain.  Genitourinary:  Negative for vaginal bleeding and vaginal discharge.  All other systems reviewed and are negative.   Updated Vital Signs BP 119/85 (BP Location: Left Arm)   Pulse (!) 108   Temp 98.1 F (36.7 C) (Oral)   Resp 18   Ht 1.626 m (5' 4)   Wt 78.5 kg   LMP 08/29/2023   SpO2 98%   BMI 29.70 kg/m   Physical Exam Vitals and nursing note reviewed.  Constitutional:      Appearance: She is well-developed. She is not ill-appearing.  HENT:     Head: Normocephalic and atraumatic.  Eyes:      Pupils: Pupils are equal, round, and reactive to light.  Cardiovascular:     Rate and Rhythm: Normal rate and regular rhythm.     Heart sounds: Normal heart sounds.  Pulmonary:     Effort: Pulmonary effort is normal. No respiratory distress.  Abdominal:     Palpations: Abdomen is soft.     Tenderness: There is no abdominal tenderness.     Comments: Gravid to term  Genitourinary:    Comments: Deferred Musculoskeletal:     Cervical back: Neck supple.  Skin:    General: Skin is warm and dry.  Neurological:     Mental Status: She is alert and oriented to person, place, and time.  Psychiatric:        Mood and Affect: Mood normal.     (all labs ordered are listed, but only abnormal results are displayed) Labs Reviewed  URINALYSIS, ROUTINE W REFLEX MICROSCOPIC    EKG: None  Radiology: No results found.   Procedures   Medications Ordered in the ED  lactated ringers  bolus 1,000 mL (has no administration in time range)    Clinical Course as of 06/20/24 0551  Sat Jun 20, 2024  0546 Spoke to Dr. Jayne.  With decreased fetal movement for prolonged period time, recommends direct admit to labor and delivery.  Will update rapid response nurse. [CH]    Clinical Course User Index [CH] Abriel Hattery, Charmaine FALCON, MD                                 Medical Decision Making Amount and/or Complexity of Data Reviewed Labs: ordered.   This patient presents to the ED for concern of decreased fetal movement, this involves an extensive number of treatment options, and is a complaint that carries with it a high risk of complications and morbidity.  I considered the following differential and admission for this acute, potentially life threatening condition.  The differential diagnosis includes fetal distress, fetal demise, sleep, oligohydramnios, placental insufficiency, umbilical cord complications  MDM:    This is a 33 year old female currently 38 weeks and 4 days pregnant who presents with  decreased fetal movement.  Vital signs reassuring.  Patient immediately hooked up to monitor.  No contractions noted.  FETAL heart rate 140s.  Given prolonged time of decreased fetal movement, I discussed with OB/GYN for need for transfer for BPP or NST.  However, Dr. Jayne indicated given that she is beyond 37 weeks with prolonged decreased movement, he would recommend admission to labor and delivery for delivery.  Patient given a liter of LR.  Discussed with rec response nurse.  Will plan for direct transfer for delivery.  Patient was maintained on the fetal monitor.  (Labs, imaging, consults)  Labs: I Ordered, and personally interpreted  labs.  The pertinent results include:  UA  Imaging Studies ordered: I ordered imaging studies including none I independently visualized and interpreted imaging. I agree with the radiologist interpretation  Additional history obtained from chart review.  External records from outside source obtained and reviewed including OB notes  Cardiac Monitoring: The patient was maintained on a cardiac monitor.  If on the cardiac monitor, I personally viewed and interpreted the cardiac monitored which showed an underlying rhythm of: nS  Reevaluation: After the interventions noted above, I reevaluated the patient and found that they have :stayed the same  Social Determinants of Health: hx of alcohol abuse  Disposition:  transfer  Co morbidities that complicate the patient evaluation  Past Medical History:  Diagnosis Date   Anxiety    Carpal tunnel syndrome of right wrist 06/10/2019   Depression    Genital herpes    HPV (human papilloma virus) anogenital infection    Hypertension 06/22/2017   Migraines    PTSD (post-traumatic stress disorder)      Medicines Meds ordered this encounter  Medications   lactated ringers  bolus 1,000 mL    I have reviewed the patients home medicines and have made adjustments as needed  Problem List / ED Course: Problem  List Items Addressed This Visit   None Visit Diagnoses       Decreased fetal movements in third trimester, single or unspecified fetus    -  Primary                Final diagnoses:  Decreased fetal movements in third trimester, single or unspecified fetus    ED Discharge Orders     None          Bari Charmaine FALCON, MD 06/20/24 [phone]

## 2024-06-20 NOTE — Progress Notes (Signed)
 Received call from [facility] ED regarding pt presenting with c/o decreased/no FM since 06/19/24 @ 1700. Reports pt tried ice cream & other sugary drink with no resolve. Pt is a G1P0 at [redacted]w[redacted]d. Will monitor & update Dr Jayne.

## 2024-06-20 NOTE — Progress Notes (Signed)
 [facility] ED RN called back reporting that ED MD has already called Dr Jayne, and a transfer has been approved to come to L&D.

## 2024-06-20 NOTE — ED Notes (Signed)
 Spoke with Vertell, MAINE RN who received report on this pt and states pt will be going to room 211 at 7a or after. Accepting MD Eure.

## 2024-06-20 NOTE — ED Notes (Signed)
 Pt connected to OB monitor at bedside. Fetal Heart rate reads 138.

## 2024-06-20 NOTE — MAU Note (Signed)
 Victoria [name] is a 33 y.o. at [redacted]w[redacted]d here in MAU reporting: direct admit from [facility] for IOL. Hold in MAU for L&D census   Pain score: none Vitals:   06/20/24 0615 06/20/24 0709  BP: 110/80 110/80  Pulse: 96 95  Resp:  17  Temp:  98 F (36.7 C)  SpO2: 98% 98%     FHT: 130  Lab orders placed from triage: none

## 2024-06-21 ENCOUNTER — Encounter (HOSPITAL_COMMUNITY): Payer: Self-pay | Admitting: Anesthesiology

## 2024-06-21 ENCOUNTER — Encounter (HOSPITAL_COMMUNITY): Payer: Self-pay | Admitting: Obstetrics & Gynecology

## 2024-06-21 MED ORDER — LACTATED RINGERS IV SOLN: 500.0000 mL | Freq: Once | INTRAVENOUS | Status: DC

## 2024-06-21 MED ORDER — PHENYLEPHRINE 80 MCG/ML (10ML) SYRINGE FOR IV PUSH (FOR BLOOD PRESSURE SUPPORT)
80.0000 ug | PREFILLED_SYRINGE | INTRAVENOUS | Status: AC | PRN
  Administered 2024-06-22 (×2): 160 ug via INTRAVENOUS

## 2024-06-21 MED ORDER — EPHEDRINE 5 MG/ML INJ: 10.0000 mg | INTRAVENOUS | Status: DC | PRN

## 2024-06-21 MED ORDER — LORAZEPAM 1 MG PO TABS
1.0000 mg | ORAL_TABLET | Freq: Four times a day (QID) | ORAL | Status: DC | PRN
  Administered 2024-06-21 – 2024-06-22 (×2): 1 mg via ORAL
  Filled 2024-06-21 (×2): qty 2

## 2024-06-21 MED ORDER — DIPHENHYDRAMINE HCL 50 MG/ML IJ SOLN
INTRAMUSCULAR | Status: AC
  Filled 2024-06-21: qty 1

## 2024-06-21 MED ORDER — DIPHENHYDRAMINE HCL 50 MG/ML IJ SOLN: 12.5000 mg | INTRAMUSCULAR | Status: DC | PRN

## 2024-06-21 MED ORDER — FENTANYL CITRATE (PF) 100 MCG/2ML IJ SOLN
25.0000 ug | INTRAMUSCULAR | Status: DC | PRN
  Administered 2024-06-21: 25 ug via INTRAVENOUS
  Filled 2024-06-21: qty 2

## 2024-06-21 MED ORDER — OXYTOCIN-SODIUM CHLORIDE 30-0.9 UT/500ML-% IV SOLN
1.0000 m[IU]/min | INTRAVENOUS | Status: DC
  Administered 2024-06-21: 2 m[IU]/min via INTRAVENOUS

## 2024-06-21 MED ORDER — TERBUTALINE SULFATE 1 MG/ML IJ SOLN: 0.2500 mg | Freq: Once | INTRAMUSCULAR | Status: DC | PRN

## 2024-06-21 MED ORDER — DIPHENHYDRAMINE HCL 50 MG/ML IJ SOLN
12.5000 mg | Freq: Once | INTRAMUSCULAR | Status: AC
  Administered 2024-06-21: 12.5 mg via INTRAVENOUS

## 2024-06-21 MED ORDER — PHENYLEPHRINE 80 MCG/ML (10ML) SYRINGE FOR IV PUSH (FOR BLOOD PRESSURE SUPPORT)
80.0000 ug | PREFILLED_SYRINGE | INTRAVENOUS | Status: DC | PRN
  Filled 2024-06-21: qty 10

## 2024-06-21 MED ORDER — LACTATED RINGERS IV SOLN: INTRAVENOUS | Status: DC

## 2024-06-21 MED ORDER — FENTANYL CITRATE (PF) 100 MCG/2ML IJ SOLN
50.0000 ug | INTRAMUSCULAR | Status: DC | PRN
  Administered 2024-06-21: 100 ug via INTRAVENOUS
  Filled 2024-06-21: qty 2

## 2024-06-21 MED ORDER — FENTANYL-BUPIVACAINE-NACL 0.5-0.125-0.9 MG/250ML-% EP SOLN
12.0000 mL/h | EPIDURAL | Status: DC | PRN
  Administered 2024-06-22: 12 mL/h via EPIDURAL
  Filled 2024-06-21: qty 250

## 2024-06-21 NOTE — Anesthesia Preprocedure Evaluation (Signed)
 Anesthesia Evaluation  Patient identified by MRN, date of birth, ID band Patient awake    Reviewed: Allergy & Precautions, Patient's Chart, lab work & pertinent test results  Airway Mallampati: II  TM Distance: >3 FB     Dental no notable dental hx. (+) Teeth Intact   Pulmonary former smoker   Pulmonary exam normal breath sounds clear to auscultation       Cardiovascular hypertension, Normal cardiovascular exam Rhythm:Regular Rate:Normal     Neuro/Psych  Headaches PSYCHIATRIC DISORDERS Anxiety Depression    PTSD Neuromuscular disease    GI/Hepatic Neg liver ROS,GERD  ,,  Endo/Other  negative endocrine ROS    Renal/GU negative Renal ROS  negative genitourinary   Musculoskeletal Hx/o scoliosis S/P Harrington Rods age 33   Abdominal   Peds  Hematology   Anesthesia Other Findings   Reproductive/Obstetrics (+) Pregnancy HSV HPV                              Anesthesia Physical Anesthesia Plan  ASA: 2  Anesthesia Plan: Epidural   Post-op Pain Management: Minimal or no pain anticipated   Induction: Intravenous  PONV Risk Score and Plan:   Airway Management Planned: Natural Airway  Additional Equipment: Fetal Monitoring and None  Intra-op Plan:   Post-operative Plan:   Informed Consent: I have reviewed the patients History and Physical, chart, labs and discussed the procedure including the risks, benefits and alternatives for the proposed anesthesia with the patient or authorized representative who has indicated his/her understanding and acceptance.       Plan Discussed with: Anesthesiologist  Anesthesia Plan Comments: (I had a long discussion with [name] regarding epidural placement for labor analgesia along with risks and benefits. She understands that she may get incomplete pain relief even if an epidural catheter is successfully placed.)        Anesthesia Quick  Evaluation

## 2024-06-21 NOTE — Progress Notes (Signed)
 Victoria [name] is a 33 y.o. G1P0 at [redacted]w[redacted]d.  Subjective: Mild discomfort with contractions.  Coping well.  Objective: BP 112/80   Pulse (!) 112   Temp 97.8 F (36.6 C) (Axillary)   Resp 18   Ht 5' 4 (1.626 m)   Wt 78.5 kg   LMP 08/29/2023   SpO2 99%   BMI 29.70 kg/m    FHT:  FHR: 140 bpm, variability: Moderate,  accelerations: 15 X15,  decelerations: None UC:   Q 2-5 minutes, mild-moderate Dilation: 4 Effacement (%): Thick Cervical Position:  (left side) Station: -3 Presentation: Vertex Exam by:: Dayonna Selbe CNM Foley bulb out of cervix.  Removed from vagina.  Labs: No results found for this or any previous visit (from the past 24 hours).  Assessment / Plan: [redacted]w[redacted]d week IUP ROM x rupture date or rupture time have not been documented Labor: Early/IOL.  Progressing well after Foley bulb.  Start Pitocin .  Consider AROM when fetal head is lower. Fetal Wellbeing:  Category 1 Pain Control: Comfort measures.  Planning epidural. Anticipated MOD: SVD  Victoria [name] , CNM 06/21/2024 6:46 PM

## 2024-06-21 NOTE — Progress Notes (Addendum)
 Victoria [name] is a 33 y.o. G1P0 at [redacted]w[redacted]d.  Subjective: Mild cramping. Requests IV pain meds for foley placement.   Objective: BP 119/81   Pulse 95   Temp 97.8 F (36.6 C) (Oral)   Resp 18   Ht 5' 4 (1.626 m)   Wt 78.5 kg   LMP 08/29/2023   SpO2 99%   BMI 29.70 kg/m    FHT:  FHR: 135 bpm, variability: mod,  accelerations:  15x15,  decelerations:  none UC:   Q 1.5-5 minutes, mild Dilation: 1.5 Effacement (%): 50 Cervical Position:  (left side) Station: -3 Presentation: Vertex Exam by:: Victoria, CNM Foley bulb placed without difficulty. Filled with 60 cc fluid. Pt tolerated well.    Labs: No results found for this or any previous visit (from the past 24 hours).  MR Spine without contrast  IMPRESSION: 1. Pedicle screw and rod fixation extending from the thoracic spine to the L4 level, with associated artifact obscuring the spinal canal to the L2 level. 2. Partial laminectomies through the L2 level. 3. Nerve roots clustered posteriorly at the L2-3 level, suggesting chronic arachnoiditis. Nerve roots are distributed more normally at the L3-4 level and below. 4. Mild disc bulging and facet hypertrophy at L4-5 with adjacent effusion, resulting in mild left foraminal narrowing.  Assessment / Plan: [redacted]w[redacted]d week IUP ROM x rupture date or rupture time have not been documented Labor: Early, progressing on Cytotec ., Foley balloon now in place. Plan AROM, pitocin  PRN for IOL.  Fetal Wellbeing:  Category i Pain Control:  Fentanyl . Dr. Jerrye, anesthesiologist consulted RE: epidural with Harrington rods in place. He anticipates that they will not be a barrier to epidural placement. May have epidural PRN.  Anticipated MOD:  SVD Harrington Rods in place: OK for epidural per Dr. Jerrye.   Victoria [name] , CNM 06/21/2024 10:46 AM

## 2024-06-21 NOTE — Progress Notes (Signed)
 Labor Progress Note Nehemiah Montee is a 33 y.o. G1P0 at [redacted]w[redacted]d presented for IOL due to DFM  S: Tired, sleeping but easily arousable.   O:  BP 120/80 (BP Location: Left Arm)   Pulse (!) 103   Temp 98 F (36.7 C) (Oral)   Resp 16   Ht 5' 4 (1.626 m)   Wt 78.5 kg   LMP 08/29/2023   SpO2 99%   BMI 29.70 kg/m  EFM: 140/moderate variability/accels (+), decels (-)  CVE: Dilation: Fingertip Effacement (%): Thick Cervical Position: Posterior Station: -3 Presentation: Vertex Exam by:: Marquita C, RN   [name]: 33 y.o. G1P0 [redacted]w[redacted]d  #Labor: Patient would now like to wait for a second opinion regarding epidural. She states that if she cannot get an epidural she would want a c-section and would not want to proceed with IOL.  #Pain: Currently coping well.  #FWB: Cat I Tracing #GBS negative   Daimion Adamcik LITTIE Angles, MD 2:37 AM

## 2024-06-21 NOTE — Progress Notes (Signed)
 Labor Progress Note Victoria [name] is a 33 y.o. G1P0 at [redacted]w[redacted]d presented for IOL due to DFM  S: Patient is doing well, not feeling contractions, pain well controlled. Eating dinner when provider walked into the room.   O:  BP 120/80 (BP Location: Left Arm)   Pulse (!) 103   Temp 98 F (36.7 C) (Oral)   Resp 16   Ht 5' 4 (1.626 m)   Wt 78.5 kg   LMP 08/29/2023   SpO2 99%   BMI 29.70 kg/m  EFM: 145/moderate variability/accels (+), decels (-)  CVE: Dilation: Fingertip Effacement (%): Thick Cervical Position: Posterior Station: -3 Presentation: Vertex Exam by:: Marquita C, RN   [name]: 33 y.o. G1P0 [redacted]w[redacted]d  #Labor: Patient is debating on what to do next. She was told that she could not have an epidural due to hx of scoliosis with harrington rods in place. MRI completed and expedited to be read. Patient is debating on whether to proceed with induction or wait for second opinion regarding epidural placement. She stated that she would take some time to think about it. Received dose of Cytotec  around 2 hours ago. Will continue to discuss plan at next cervical check.   #Pain: Would like epidural #FWB: Cat I Tracing #GBS negative   Cheskel Silverio L Amjad Fikes, MD 2:26 AM

## 2024-06-21 NOTE — Plan of Care (Signed)

## 2024-06-21 NOTE — Progress Notes (Signed)
 LABOR PROGRESS NOTE Pt rechecked at 2330 Patient comfortable with*** epidural. Pit at ***. SCE: *** AROM *** FHT: baseline ***, *** variability, +accels, ***decels; overall category ***. Toco: q*** min  A/P:  ***  Adeeb Konecny, DO 11:26 PM

## 2024-06-22 ENCOUNTER — Inpatient Hospital Stay (HOSPITAL_COMMUNITY): Payer: MEDICAID | Admitting: Anesthesiology

## 2024-06-22 ENCOUNTER — Encounter (HOSPITAL_COMMUNITY): Payer: Self-pay | Admitting: Obstetrics & Gynecology

## 2024-06-22 DIAGNOSIS — O368130 Decreased fetal movements, third trimester, not applicable or unspecified: Secondary | ICD-10-CM

## 2024-06-22 DIAGNOSIS — O9832 Other infections with a predominantly sexual mode of transmission complicating childbirth: Secondary | ICD-10-CM

## 2024-06-22 DIAGNOSIS — O99344 Other mental disorders complicating childbirth: Secondary | ICD-10-CM

## 2024-06-22 DIAGNOSIS — Z3A38 38 weeks gestation of pregnancy: Secondary | ICD-10-CM

## 2024-06-22 LAB — COMPREHENSIVE METABOLIC PANEL WITH GFR
ALT: 14 U/L (ref 0–44)
AST: 20 U/L (ref 15–41)
Albumin: 2.5 g/dL — ABNORMAL LOW (ref 3.5–5.0)
Alkaline Phosphatase: 133 U/L — ABNORMAL HIGH (ref 38–126)
Anion gap: 12 (ref 5–15)
BUN: 5 mg/dL — ABNORMAL LOW (ref 6–20)
CO2: 19 mmol/L — ABNORMAL LOW (ref 22–32)
Calcium: 9.1 mg/dL (ref 8.9–10.3)
Chloride: 104 mmol/L (ref 98–111)
Creatinine, Ser: 0.68 mg/dL (ref 0.44–1.00)
GFR, Estimated: >60 mL/min (ref >60)
Glucose, Bld: 102 mg/dL — ABNORMAL HIGH (ref 70–99)
Potassium: 3.9 mmol/L (ref 3.5–5.1)
Sodium: 135 mmol/L (ref 135–145)
Total Bilirubin: 1.2 mg/dL (ref 0.0–1.2)
Total Protein: 5.9 g/dL — ABNORMAL LOW (ref 6.5–8.1)

## 2024-06-22 LAB — CBC
HCT: 37.4 % (ref 36.0–46.0)
Hemoglobin: 12.1 g/dL (ref 12.0–15.0)
MCH: 26.1 pg (ref 26.0–34.0)
MCHC: 32.4 g/dL (ref 30.0–36.0)
MCV: 80.6 fL (ref 80.0–100.0)
Platelets: 146 K/uL — ABNORMAL LOW (ref 150–400)
RBC: 4.64 MIL/uL (ref 3.87–5.11)
RDW: 14.6 % (ref 11.5–15.5)
WBC: 10.1 K/uL (ref 4.0–10.5)
nRBC: 0 % (ref 0.0–0.2)

## 2024-06-22 MED ORDER — WITCH HAZEL-GLYCERIN EX PADS: 1.0000 | MEDICATED_PAD | CUTANEOUS | Status: DC | PRN

## 2024-06-22 MED ORDER — ONDANSETRON HCL 4 MG/2ML IJ SOLN: 4.0000 mg | INTRAMUSCULAR | Status: DC | PRN

## 2024-06-22 MED ORDER — BENZOCAINE-MENTHOL 20-0.5 % EX AERO
1.0000 | INHALATION_SPRAY | CUTANEOUS | Status: DC | PRN
  Administered 2024-06-22: 1 via TOPICAL
  Filled 2024-06-22 (×2): qty 56

## 2024-06-22 MED ORDER — TRANEXAMIC ACID-NACL 1000-0.7 MG/100ML-% IV SOLN
INTRAVENOUS | Status: AC
  Administered 2024-06-22: 1000 mg
  Filled 2024-06-22: qty 100

## 2024-06-22 MED ORDER — IBUPROFEN 600 MG PO TABS
600.0000 mg | ORAL_TABLET | Freq: Four times a day (QID) | ORAL | Status: DC
  Administered 2024-06-22 – 2024-06-24 (×6): 600 mg via ORAL
  Filled 2024-06-22 (×6): qty 1

## 2024-06-22 MED ORDER — DIBUCAINE (PERIANAL) 1 % EX OINT: 1.0000 | TOPICAL_OINTMENT | CUTANEOUS | Status: DC | PRN

## 2024-06-22 MED ORDER — TETANUS-DIPHTH-ACELL PERTUSSIS 5-2.5-18.5 LF-MCG/0.5 IM SUSY: 0.5000 mL | PREFILLED_SYRINGE | Freq: Once | INTRAMUSCULAR | Status: DC

## 2024-06-22 MED ORDER — LACTATED RINGERS AMNIOINFUSION
INTRAVENOUS | Status: DC
  Filled 2024-06-22 (×2): qty 1000

## 2024-06-22 MED ORDER — LIDOCAINE HCL (PF) 1 % IJ SOLN
INTRAMUSCULAR | Status: DC | PRN
  Administered 2024-06-22 (×2): 5 mL via EPIDURAL

## 2024-06-22 MED ORDER — SIMETHICONE 80 MG PO CHEW: 80.0000 mg | CHEWABLE_TABLET | ORAL | Status: DC | PRN

## 2024-06-22 MED ORDER — DIPHENHYDRAMINE HCL 25 MG PO CAPS: 25.0000 mg | ORAL_CAPSULE | Freq: Four times a day (QID) | ORAL | Status: DC | PRN

## 2024-06-22 MED ORDER — ONDANSETRON HCL 4 MG PO TABS: 4.0000 mg | ORAL_TABLET | ORAL | Status: DC | PRN

## 2024-06-22 MED ORDER — PRENATAL MULTIVITAMIN CH
1.0000 | ORAL_TABLET | Freq: Every day | ORAL | Status: DC
  Administered 2024-06-23: 1 via ORAL
  Filled 2024-06-22: qty 1

## 2024-06-22 MED ORDER — TRANEXAMIC ACID-NACL 1000-0.7 MG/100ML-% IV SOLN: 1000.0000 mg | INTRAVENOUS | Status: DC

## 2024-06-22 MED ORDER — ACETAMINOPHEN 325 MG PO TABS: 650.0000 mg | ORAL_TABLET | ORAL | Status: DC | PRN

## 2024-06-22 MED ORDER — MEASLES, MUMPS & RUBELLA VAC IJ SOLR: 0.5000 mL | Freq: Once | INTRAMUSCULAR | Status: DC

## 2024-06-22 MED ORDER — COCONUT OIL OIL
1.0000 | TOPICAL_OIL | Status: DC | PRN
  Administered 2024-06-22: 1 via TOPICAL

## 2024-06-22 MED ORDER — OXYCODONE HCL 5 MG PO TABS: 5.0000 mg | ORAL_TABLET | ORAL | Status: DC | PRN

## 2024-06-22 MED ORDER — SENNOSIDES-DOCUSATE SODIUM 8.6-50 MG PO TABS
2.0000 | ORAL_TABLET | ORAL | Status: DC
  Administered 2024-06-23: 2 via ORAL
  Filled 2024-06-22: qty 2

## 2024-06-22 NOTE — Anesthesia Procedure Notes (Signed)
 Epidural Patient location during procedure: OB Start time: 06/22/2024 1:49 AM End time: 06/22/2024 2:09 AM  Staffing Anesthesiologist: Jerrye Sharper, MD Performed: anesthesiologist   Preanesthetic Checklist Completed: patient identified, IV checked, site marked, risks and benefits discussed, surgical consent, monitors and equipment checked, pre-op evaluation and timeout performed  Epidural Patient position: sitting Prep: DuraPrep and site prepped and draped Patient monitoring: continuous pulse ox and blood pressure Approach: midline Location: L4-L5 Injection technique: LOR air  Needle:  Needle type: Tuohy  Needle gauge: 17 G Needle length: 9 cm and 9 Needle insertion depth: 8 cm Catheter type: closed end flexible Catheter size: 19 Gauge Catheter at skin depth: 13 cm Test dose: negative and Other  Assessment Events: blood not aspirated, no cerebrospinal fluid, injection not painful, no injection resistance, no paresthesia and negative IV test  Additional Notes Patient identified. Risks and benefits discussed including failed block, incomplete  Pain control, post dural puncture headache, nerve damage, paralysis, blood pressure Changes, nausea, vomiting, reactions to medications-both toxic and allergic and post Partum back pain. All questions were answered. Patient expressed understanding and wished to proceed. Sterile technique was used throughout procedure. Epidural site was Dressed with sterile barrier dressing. No paresthesias, signs of intravascular injection Or signs of intrathecal spread were encountered. Technically difficult due to previous back surgery. Attempt x 5. Patient was more comfortable after the epidural was dosed. Please see RN's note for documentation of vital signs and FHR which are stable. Reason for block:procedure for pain

## 2024-06-22 NOTE — Lactation Note (Signed)
 This note was copied from a baby's chart. Lactation Consultation Note  Patient Name: Victoria [name] Unijb'd Date: 06/22/2024 Age:32 hours Reason for consult: Initial assessment;1st time breastfeeding;NICU baby;Early term 37-38.6wks  Infant in NICU, LC set MOB up with DEBP using a 21 mm breast flange. MOB will follow NICU feeding guidelines. MOB will continue to use the DEBP every 3 hours for 15 minutes on initial setting. MOB will take any EBM to NICU. MOB knows that her EBM is safe for 4 hours at room temperature. MOB will receive bottles and labels from NICU. LC discussed importance of rest, meals and hydration. MOB does have DEBP at home and now manual hand pump in her DEBP kit. MOB was made aware of O/P services, breastfeeding support groups, community resources, and our phone # for post-discharge questions.    Maternal Data Does the patient have breastfeeding experience prior to this delivery?: No  Feeding Mother's Current Feeding Choice: Breast Milk  LATCH Score   Infant currently in NICU.                 Lactation Tools Discussed/Used Tools: Flanges;Pump Flange Size: 21 Breast pump type: Double-Electric Breast Pump Pump Education: Setup, frequency, and cleaning;Milk Storage Reason for Pumping: Infant in NICU Pumping frequency: MOb will continue to use the DEBP every 3 hours for 15 minutes on inital setting.  Interventions Interventions: Breast feeding basics reviewed;DEBP;Guidelines for Milk Supply and Pumping Schedule Handout;LC Services brochure;CDC milk storage guidelines;CDC Guidelines for Breast Pump Cleaning;LPT handout/interventions;Education  Discharge Pump: DEBP;Hands Free;Personal  Consult Status Consult Status: Follow-up Date: 06/23/24 Follow-up type: In-patient    Victoria [name] 06/22/2024, 8:51 PM

## 2024-06-22 NOTE — Progress Notes (Signed)
   06/22/24 1735  Spiritual Encounters  Type of Visit Initial  Care provided to: Pt not available  Referral source Code page  Reason for visit Code  OnCall Visit No   Chaplain responded to a neo-natal code blue.  Chaplain was not needed at this time.   Carley [place] [facility]  [phone]

## 2024-06-22 NOTE — Discharge Summary (Signed)
 Postpartum Discharge Summary  Date of Service updated***     Patient Name: Victoria [name] DOB: 1991/08/20 MRN: [id number]  Date of admission: 06/20/2024 Delivery date:06/22/2024 Delivering provider: VON REASONER Date of discharge: 06/22/2024  Admitting diagnosis: Decreased fetal movements in third trimester, single or unspecified fetus [O36.8130] Decreased fetal movement [O36.8190] Intrauterine pregnancy: [redacted]w[redacted]d     Secondary diagnosis:  Principal Problem:   Decreased fetal movement Active Problems:   PTSD (post-traumatic stress disorder)   Social anxiety disorder   HSV (herpes simplex virus) infection   History of scoliosis   MDD (major depressive disorder), recurrent, in partial remission (HCC)  Additional problems: none    Discharge diagnosis: Term Pregnancy Delivered                                              Post partum procedures:Nexplanon  placement *** Augmentation: Pitocin , Cytotec , and IP Foley Complications: Shoulder dystocia  Hospital course: Induction of Labor With Vaginal Delivery   33 y.o. yo G1P1001 at 109w6d was admitted to the hospital 06/20/2024 for induction of labor.  Indication for induction: decreased fetal movement.  Patient had an labor course complicated by nothing. Delivery complicated by shoulder dystocia <1 min. Membrane Rupture Time/Date: 12:44 AM,06/22/2024  Delivery Method:Vaginal, Spontaneous Operative Delivery:N/A Episiotomy: None Lacerations:  1st degree Details of delivery can be found in separate delivery note.  Patient had a postpartum course complicated by***. Patient is discharged home 06/22/24.  Newborn Data: Birth date:06/22/2024 Birth time:5:30 PM Gender:Female Living status:Living Apgars:3 ,  Weight:3420 g  Magnesium  Sulfate received: {Mag received:30440022} BMZ received: No Rhophylac:N/A MMR:N/A T-DaP:Given prenatally Flu: No RSV Vaccine received: No Transfusion:{Transfusion received:30440034}  Immunizations  received: Immunization History  Administered Date(s) Administered   Influenza,inj,Quad PF,6+ Mos 07/10/2017   Tdap 04/20/2024    Physical exam  Vitals:   06/22/24 1435 06/22/24 1500 06/22/24 1530 06/22/24 1600  BP: 119/69 (!) 124/90 123/84 118/88  Pulse: 93 (!) 101 (!) 105 (!) 121  Resp:      Temp:      TempSrc:      SpO2:      Weight:      Height:       General: {Exam; general:21111117} Lochia: {Desc; appropriate/inappropriate:30686::appropriate} Uterine Fundus: {Desc; firm/soft:30687} Incision: {Exam; incision:21111123} DVT Evaluation: {Exam; dvt:2111122} Labs: Lab Results  Component Value Date   WBC 10.1 06/22/2024   HGB 12.1 06/22/2024   HCT 37.4 06/22/2024   MCV 80.6 06/22/2024   PLT 146 (L) 06/22/2024      Latest Ref Rng & Units 06/22/2024   10:24 AM  CMP  Glucose 70 - 99 mg/dL 897   BUN 6 - 20 mg/dL 5   Creatinine 9.55 - 8.99 mg/dL 9.31   Sodium 864 - 854 mmol/L 135   Potassium 3.5 - 5.1 mmol/L 3.9   Chloride 98 - 111 mmol/L 104   CO2 22 - 32 mmol/L 19   Calcium 8.9 - 10.3 mg/dL 9.1   Total Protein 6.5 - 8.1 g/dL 5.9   Total Bilirubin 0.0 - 1.2 mg/dL 1.2   Alkaline Phos 38 - 126 U/L 133   AST 15 - 41 U/L 20   ALT 0 - 44 U/L 14    Edinburgh Score:     No data to display         No data recorded  After visit meds:  Allergies as of 06/22/2024       Reactions   Lurasidone  Hcl Rash   Patient reports developing facial rash while taking lurasidone  with notable improvement after stopping     Med Rec must be completed prior to using this [facility]***        Discharge home in stable condition Infant Feeding: {Baby feeding:23562} Infant Disposition:{CHL IP OB HOME WITH FNUYZM:76418} Discharge instruction: per After Visit Summary and Postpartum booklet. Activity: Advance as tolerated. Pelvic rest for 6 weeks.  Diet: routine diet Future Appointments: Future Appointments  Date Time Provider Department Center  07/16/2024  2:00 PM Mercy Lauraine LABOR [facility] None  09/08/2024  2:00 PM [place], [place] LABOR [facility] None   Follow up Visit:  Loreli Suzen BIRCH, CNM  P [facility] Please schedule this patient for Postpartum visit in: 6 weeks with the following provider: Any provider In-Person For C/S patients schedule nurse incision check in weeks 2 weeks: no Low risk pregnancy complicated by: schizoaffective d/o Delivery mode:  SVD Anticipated Birth Control:  PP Nexplanon  placed (planning on this) PP Procedures needed: 2wk mood check Schedule Integrated BH visit: no   06/22/2024 Suzen BIRCH Loreli, CNM

## 2024-06-22 NOTE — Progress Notes (Signed)
 Labor Progress Note Victoria [name] is a 33 y.o. G1P0 at [redacted]w[redacted]d presented for IOL for DFM  S: Tired, comfortable w epidural.  O:  BP (!) 89/58   Pulse 98   Temp 97.8 F (36.6 C) (Oral)   Resp 18   Ht 5' 4 (1.626 m)   Wt 78.5 kg   LMP 08/29/2023   SpO2 100%   BMI 29.70 kg/m  EFM: 135/mod/+a/+variables  CVE: Dilation: 6 Effacement (%): 90 Cervical Position:  (left side) Station: -3 Presentation: Vertex Exam by:: Dr. Von   [name]: 32 y.o. G1P0 [redacted]w[redacted]d   #Labor: Unchanged since 0240 with AROM, on Pitocin   discussed placement of IUPC to further evaluate ctx strength and for adequate labor  discussed recheck in ~4hr #Pain: Epidural #FWB: Cat II, mod variability #GBS negative  #Schizoaffective disorder: Cont home Seroquel , Zoloft , Trazodone   early PPD screen  #HSV: On Valtrex   no lesions on admit exam  #Hx cHTN: No meds, PEC labs ordered  normotensive  Alain Von, MD 9:20 AM

## 2024-06-22 NOTE — Progress Notes (Signed)
 Labor progress note:  Returned to recheck pt's cervix. Not feeling pressure/contractions w epidural. EFM: 150/mod/+a/+occ variables. Overall cat II tracing w mod variability w accelerations. Cx 8/90/-2. Toco: every 3-6 min, MVUs have been 160-180. Cervical change is encouraging, will start amnioinfusion for variables. Baby is asynclitic w more cervix on right, but baby does not tolerate position changes to right side very well. Will attempt position changes as tolerated by baby.  Alain Sor, MD OB Fellow, Faculty Practice [facility], Center for [facility]

## 2024-06-23 NOTE — Progress Notes (Signed)
 Post Partum Day 1 Subjective: No complaints, up ad lib, voiding, tolerating PO, and + flatus Baby is doing better in NICU, as per patient. Breastfeeding.    Objective: Blood pressure (!) 101/57, pulse 87, temperature 97.9 F (36.6 C), temperature source Oral, resp. rate 18, height 5' 4 (1.626 m), weight 78.5 kg, last menstrual period 08/29/2023, SpO2 100%, unknown if currently breastfeeding.  Physical Exam:  General: alert and no distress Lochia: appropriate DVT Evaluation: No evidence of DVT seen on physical exam.  Recent Labs    06/20/24 0927 06/22/24 0000  HGB 12.4 12.1  HCT 38.2 37.4    Assessment/Plan: Breastfeeding, Lactation consult, and Contraception Nexplanon  to be placed prior to discharge Routine postpartum care   LOS: 3 days   Gloris Hugger, MD 06/23/2024, 7:36 AM

## 2024-06-23 NOTE — Clinical Social Work Maternal (Signed)
 CLINICAL SOCIAL WORK MATERNAL/CHILD NOTE  Patient Details  Name: Victoria [name] MRN: [id number] Date of Birth: August 03, 1991  Date:  06/23/2024  Clinical Social Worker Initiating Note:  Suzen Law, [place] Date/Time: Initiated:  06/23/24/1411     Child's Name:  Victoria [name]   Biological Parents:  Mother, Father (Father: Demetrius [employer])   Need for Interpreter:  None   Reason for Referral:  Behavioral Health Concerns, Other (Comment) (Infant's NICU Admission)   Address:  [address] [place] [address]    Phone number:  [phone] (home)     Additional phone number:  Household Members/Support Persons (HM/SP):   Household Member/Support Person 1   HM/SP Name Relationship DOB or Age  HM/SP -1 Demetrius Cobb FOB 09/27/68  HM/SP -2        HM/SP -3        HM/SP -4        HM/SP -5        HM/SP -6        HM/SP -7        HM/SP -8          Natural Supports (not living in the home):  Parent   Professional Supports: Therapist   Employment: Unemployed   Type of Work:     Education:  Engineer, agricultural   Homebound arranged:    Surveyor, quantity Resources:  Medicaid   Other Resources:  Sales executive  , WIC   Cultural/Religious Considerations Which May Impact Care:    Strengths:  Ability to meet basic needs  , Psychotropic Medications, Understanding of illness, Home prepared for child     Psychotropic Medications:  Zoloft , Seroquel , Trazodone       Pediatrician:       Pediatrician List:   [employer] [place]    [place]      Pediatrician Fax Number:    Risk Factors/Current Problems:  Mental Health Concerns     Cognitive State:  Able to Concentrate  , Alert  , Goal Oriented  , Linear Thinking     Mood/Affect:  Calm  , Comfortable  , Interested  , Euthymic  , Relaxed     CSW Assessment: CSW met with MOB at bedside to complete psychosocial assessment. CSW introduced self and  explained role. MOB was welcoming, pleasant, and remained engaged during assessment. MOB reported that she resides with FOB and receives both [facility] and food stamps. MOB reported having some items needed to care for infant including a car seat and bassinet. CSW informed MOB about [employer]'s [employer] if any assistance is needed obtaining items for infant. MOB reported that a referral for all essential items would be helpful, CSW agreed to complete a referral. CSW inquired about MOB's support system, MOB reported that FOB and her mom are supports.   CSW inquired about MOB's mental health history. MOB reported that she was diagnosed with bipolar disorder, depression, and anxiety at age 60. MOB explained that the bipolar diagnosis was a misdiagnosis and shared that it would be removed from her chart soon. MOB endorsed a history of depression and anxiety. MOB denied any current symptoms and shared that she is currently taking the following medications (Zoloft , Trazodone , Seroquel ). MOB reported that her medications are helpful and verbalized a plan to continue taking her medication. MOB reported that she has a psychiatry appointment scheduled for 07/16/24. CSW emphasized the  importance of close psychiatric follow up in the postpartum period, MOB verbalized understanding. MOB reported that she plans to start therapy at [facility] once infant is discharged. CSW positively affirmed MOB's participation in treatment and encouraged continued participation in treatment. CSW inquired about coping skills to manage symptoms when they arise, MOB denied any additional coping skills. MOB shared that she is fine as Catia Todorov as she takes her medication. CSW emphasized the importance medication adherence in the postpartum period. MOB denied any additional mental health history. CSW inquired about how MOB was feeling emotionally since giving birth, MOB reported that she was feeling great.  MOB shared that it is sad that infant is in the NICU but she knows that he will be okay. MOB also expressed joy about having her body back. CSW acknowledged, normalized, and validated MOB's feelings. MOB presented calm and did not demonstrate any acute mental health signs/symptoms. CSW assessed for safety, MOB denied SI, HI, and domestic violence. CSW informed MOB that she may be more susceptible to postpartum depression due to her mental health history, MOB verbalized understanding.   CSW provided education regarding the baby blues period vs. perinatal mood disorders, discussed treatment and gave resources for mental health follow up if concerns arise.  CSW recommends self-evaluation during the postpartum time period using the New Mom Checklist from Postpartum Progress and encouraged MOB to contact a medical professional if symptoms are noted at any time.    CSW provided review of Sudden Infant Death Syndrome (SIDS) precautions.    CSW and MOB discussed infant's NICU admission. CSW informed MOB about the NICU, what to expect, and resources/supports available while infant is admitted to the NICU. MOB reported that she feels well informed about infant's care and denied any transportation barriers with visiting infant in the NICU. MOB verbalized a plan to stay with infant in the NICU. MOB denied any questions/concerns regarding the NICU. CSW asked if MOB was interested in any home visiting programs for additional support post discharge. MOB reported that she is signed up for the nurse family partnership program which provide home visits for additional support.   CSW inquired about any needs for resources/supports, MOB requested that CSW inquire about infant's ability to get Medicaid. CSW agreed to follow up with financial counselor. MOB thanked CSW and denied any additional needs.   CSW made a FSN referral for requested items.   CSW sent a secure chat to financial counselor and is awaiting a response.    CSW will continue to offer resources/supports while infant is admitted to the NICU as MOB opted for CSW to check in weekly.   CSW Plan/Description:  Sudden Infant Death Syndrome (SIDS) Education, Perinatal Mood and Anxiety Disorder (PMADs) Education, Psychosocial Support and Ongoing Assessment of Needs, Other Information/Referral to [employer], Other Patient/Family Education    [place], LCSW 06/23/2024, 2:15 PM

## 2024-06-23 NOTE — Plan of Care (Signed)

## 2024-06-23 NOTE — Lactation Note (Signed)
 This note was copied from a baby's chart.  NICU Lactation Consultation Note  Patient Name: Victoria [name] Date: 06/23/2024 Age:32 hours  Reason for consult: Follow-up assessment; NICU baby; Primapara; 1st time breastfeeding; Early term 37-38.6wks; Mother's request; RN request; Breastfeeding assistance  SUBJECTIVE Visited with family of 61 29/79 weeks old NICU female; baby Victoria [name] was admitted due to respiratory failure. Victoria [name] is a P1 and reported she initiated pumping last night with the help of previous LC; she got a drop of colostrum, praised her for her efforts. RN Victoria [name] called out to to assist with the first latch; he was awake and doing some grunting. Assisted with breast massage and hand expression prior latching; couplet already engaging in STS care. This LC took Victoria [name] to the R side in cross cradle hold (due to IV placement) but baby started crying and would not latch or suck on gloved finger, revised some tips for latching and positioning and explained that we can try another time, asked her to call for assistance when needed. Reviewed normal newborn behavior, pumping schedule, pumping log, lactogenesis II/III and anticipatory guidelines.  OBJECTIVE Infant data: Mother's Current Feeding Choice: Breast Milk and Formula  O2 Device: Room Air FiO2 (%): 21 %  Infant feeding assessment IDFTS - Readiness: 1   Maternal data: G1P1001 Vaginal, Spontaneous Has patient been taught Hand Expression?: Yes Hand Expression Comments: no colostrum noted at this time Significant Breast History:: (+) breast changes during the pregnancy Current breast feeding challenges:: NICU admission Does the patient have breastfeeding experience prior to this delivery?: No Pumping frequency: initiated pumping at 3 hours post-partum Pumped volume: -- (1 drop) Flange Size: 21 Risk factor for low/delayed milk supply:: primipara, prematurity, infant separation  Pump: Hands Free, Personal  (MomCozy wearable)  ASSESSMENT Infant: Latch: Too sleepy or reluctant, no latch achieved, no sucking elicited. Audible Swallowing: None Type of Nipple: Flat Comfort (Breast/Nipple): Soft / non-tender Hold (Positioning): Assistance needed to correctly position infant at breast and maintain latch. LATCH Score: 4  Feeding Status: Scheduled 9-12-3-6 Feeding method: Breast  Maternal: Milk volume: Normal  INTERVENTIONS/PLAN Interventions: Interventions: Breast feeding basics reviewed; Assisted with latch; Skin to skin; Breast massage; Hand express; Adjust position; Support pillows; Breast compression; DEBP; Education Tools: Pump; Flanges; Coconut oil Pump Education: Setup, frequency, and cleaning; Milk Storage  Plan: STS around care times Breast massage, hand expression prior/after pumping, coconut oil prior to pumping Take baby to breast if desired +8 times/24 hours or sooner if feeding cues are present Family will continue advancing on bottle feedings  No other support person at this time. All questions and concerns answered, family to contact [facility] services PRN.  Consult Status: NICU follow-up NICU Follow-up type: New admission follow up   Ettel Albergo S Denorris Reust 06/23/2024, 10:01 AM

## 2024-06-23 NOTE — Anesthesia Postprocedure Evaluation (Signed)
 Anesthesia Post Note  Patient: Victoria [name]  Procedure(s) Performed: AN AD HOC LABOR EPIDURAL     Patient location during evaluation: Mother Baby Anesthesia Type: Epidural Level of consciousness: awake Pain management: satisfactory to patient Vital Signs Assessment: post-procedure vital signs reviewed and stable Respiratory status: spontaneous breathing Cardiovascular status: stable Anesthetic complications: no   No notable events documented.  Last Vitals:  Vitals:   06/23/24 0027 06/23/24 0318  BP: (!) 99/58 (!) 101/57  Pulse: 91 87  Resp: 16 18  Temp: 37.1 C 36.6 C  SpO2:      Last Pain:  Vitals:   06/23/24 0318  TempSrc: Oral  PainSc:    Pain Goal:                   JEANENNE HANDING

## 2024-06-24 DIAGNOSIS — Z30017 Encounter for initial prescription of implantable subdermal contraceptive: Secondary | ICD-10-CM

## 2024-06-24 MED ORDER — IBUPROFEN 600 MG PO TABS: 600.0000 mg | ORAL_TABLET | Freq: Four times a day (QID) | ORAL | 0 refills | Status: DC

## 2024-06-24 MED ORDER — LIDOCAINE HCL 1 % IJ SOLN
0.0000 mL | Freq: Once | INTRAMUSCULAR | Status: AC | PRN
  Administered 2024-06-24: 3 mL via INTRADERMAL
  Filled 2024-06-24: qty 20

## 2024-06-24 MED ORDER — ETONOGESTREL 68 MG ~~LOC~~ IMPL
68.0000 mg | DRUG_IMPLANT | Freq: Once | SUBCUTANEOUS | Status: AC
  Administered 2024-06-24: 68 mg via SUBCUTANEOUS
  Filled 2024-06-24: qty 1

## 2024-06-24 NOTE — Procedures (Signed)
 Post-Partum Nexplanon  Insertion Procedure Note  Patient was identified.Risks and benefits of Nexplanon  reviewed in detail.  Informed consent was signed, signed copy in chart. A time-out was performed.    Patient wanted the Nexplanon  placed at her previous insertion site, which was 8-10 cm (3-4 inches) from the medial epicondyle of the humerus. She was told the ideal location was 3-5 cm (1.25-2 inches) posterior to (below) the sulcus (groove) between the biceps and triceps muscles, but she wanted me to use previous site . The site was prepped and draped in the usual sterile fashion. She was prepped with alcohol swab and then injected with 3 cc of 1% lidocaine . The site was prepped with betadine. Nexplanon  removed form packaging,  Device confirmed in needle, then inserted full length of needle and withdrawn per handbook instructions. Provider and patient verified presence of the implant in the woman's arm by palpation. Pt insertion site was covered with steristrips/adhesive bandage and pressure bandage. There was minimal blood loss. Patient tolerated procedure well.  Patient was given post procedure instructions and Nexplanon  user card with expiration date. Condoms were recommended for STI prevention. Patient was asked to keep the pressure dressing on for 24 hours to minimize bruising and keep the adhesive bandage on for 3-5 days. The patient verbalized understanding of the plan of care and agrees.   Lot # [id number] Expiration Date 03/2026  GLORIS HUGGER, MD, FACOG Obstetrician & Gynecologist, [facility] for [employer], [facility]

## 2024-06-24 NOTE — Plan of Care (Signed)
  Problem: Education: Goal: Knowledge of General Education information will improve Description: Including pain rating scale, medication(s)/side effects and non-pharmacologic comfort measures Outcome: Adequate for Discharge   Problem: Health Behavior/Discharge Planning: Goal: Ability to manage health-related needs will improve Outcome: Adequate for Discharge   Problem: Clinical Measurements: Goal: Ability to maintain clinical measurements within normal limits will improve Outcome: Adequate for Discharge Goal: Will remain free from infection Outcome: Adequate for Discharge Goal: Diagnostic test results will improve Outcome: Adequate for Discharge Goal: Respiratory complications will improve Outcome: Adequate for Discharge Goal: Cardiovascular complication will be avoided Outcome: Adequate for Discharge   Problem: Activity: Goal: Risk for activity intolerance will decrease Outcome: Adequate for Discharge   Problem: Nutrition: Goal: Adequate nutrition will be maintained Outcome: Adequate for Discharge   Problem: Coping: Goal: Level of anxiety will decrease Outcome: Adequate for Discharge   Problem: Elimination: Goal: Will not experience complications related to bowel motility Outcome: Adequate for Discharge Goal: Will not experience complications related to urinary retention Outcome: Adequate for Discharge   Problem: Pain Managment: Goal: General experience of comfort will improve and/or be controlled Outcome: Adequate for Discharge   Problem: Safety: Goal: Ability to remain free from injury will improve Outcome: Adequate for Discharge   Problem: Skin Integrity: Goal: Risk for impaired skin integrity will decrease Outcome: Adequate for Discharge   Problem: Education: Goal: Knowledge of Childbirth will improve Outcome: Adequate for Discharge Goal: Ability to make informed decisions regarding treatment and plan of care will improve Outcome: Adequate for  Discharge Goal: Ability to state and carry out methods to decrease the pain will improve Outcome: Adequate for Discharge Goal: Individualized Educational Video(s) Outcome: Adequate for Discharge   Problem: Coping: Goal: Ability to verbalize concerns and feelings about labor and delivery will improve Outcome: Adequate for Discharge   Problem: Life Cycle: Goal: Ability to make normal progression through stages of labor will improve Outcome: Adequate for Discharge Goal: Ability to effectively push during vaginal delivery will improve Outcome: Adequate for Discharge   Problem: Role Relationship: Goal: Will demonstrate positive interactions with the child Outcome: Adequate for Discharge   Problem: Safety: Goal: Risk of complications during labor and delivery will decrease Outcome: Adequate for Discharge   Problem: Pain Management: Goal: Relief or control of pain from uterine contractions will improve Outcome: Adequate for Discharge   Problem: Education: Goal: Knowledge of condition will improve Outcome: Adequate for Discharge Goal: Individualized Educational Video(s) Outcome: Adequate for Discharge Goal: Individualized Newborn Educational Video(s) Outcome: Adequate for Discharge   Problem: Activity: Goal: Will verbalize the importance of balancing activity with adequate rest periods Outcome: Adequate for Discharge Goal: Ability to tolerate increased activity will improve Outcome: Adequate for Discharge   Problem: Coping: Goal: Ability to identify and utilize available resources and services will improve Outcome: Adequate for Discharge   Problem: Life Cycle: Goal: Chance of risk for complications during the postpartum period will decrease Outcome: Adequate for Discharge   Problem: Role Relationship: Goal: Ability to demonstrate positive interaction with newborn will improve Outcome: Adequate for Discharge   Problem: Skin Integrity: Goal: Demonstration of wound healing  without infection will improve Outcome: Adequate for Discharge

## 2024-06-24 NOTE — Progress Notes (Signed)
   06/24/24 1144  Departure Condition  Departure Condition Good  Mobility at [facility]  Patient/Caregiver Teaching Teach Back Method Used;Prescriptions reviewed;Follow-up care reviewed;Admission discussed;Discharge instructions reviewed;Pain management discussed;Medications discussed;Patient/caregiver verbalized understanding;Educated about hypertension in pregnancy  Departure Mode With family  Was procedural sedation performed on this patient during this visit? No   Pt alert and oriented x4, vital signs and pain stable at time of d/c. Discharge instructions and medications discussed, patient verbalized understanding.

## 2024-06-25 ENCOUNTER — Encounter: Payer: MEDICAID | Admitting: Family Medicine

## 2024-06-25 NOTE — Lactation Note (Signed)
 This note was copied from a baby's chart.  NICU Lactation Consultation Note  Patient Name: Victoria [name] Unijb'd Date: 06/25/2024 Age:32 hours  Reason for consult: Follow-up assessment; Mother's request; RN request; 1st time breastfeeding; Primapara; Early term 71-38.6wks; NICU baby  SUBJECTIVE Visited with family of 63 49/20 weeks old NICU female; Victoria [name] is a P1 and reported she hasn't pumped the last two days (see maternal assessment). Assisted with her pumping session in baby's room and provided a pumping band in size L for hands on pumping. Explained the importance of consistent pumping for the onset of lactogenesis II and the prevention of engorgement; encouraged to set up alarms and to wake up at least once overnight. Revised pumping settings, pumping schedule, lactogenesis II/III and anticipatory guidelines.   OBJECTIVE Infant data: Mother's Current Feeding Choice: Breast Milk and Formula  O2 Device: Room Air O2 Flow Rate (L/min): 4 L/min FiO2 (%): 21 %  Infant feeding assessment IDFTS - Readiness: 3   Maternal data: G1P1001 Vaginal, Spontaneous Pumping frequency: Has not pumped the last two days Pumped volume: 0 mL Hands-free pumping top sizes: Large Alejos)  Pump: Hands Free, Personal (MomCozy wearable)  ASSESSMENT Infant: Feeding Status: Scheduled 9-12-3-6 Feeding method: Tube/Gavage (Bolus)  Maternal: Milk volume: Normal Breast are soft but feel heavier today;  No S/S of engorgement at this time  INTERVENTIONS/PLAN Interventions: Interventions: Breast feeding basics reviewed; DEBP; Education; Coconut oil Discharge Education: Engorgement and breast care Tools: Hands-free pumping top  Plan: STS around care times Offer the breast if desired +8 times/24 hours or sooner if feeding cues are present Pump both breasts on initiate mode every 3 hours for 15 minutes, ideally 8 pumping sessions/24 hours Switch to maintain once expressing + 20 ml of EBM  combined Family will continue advancing on bottle feedings   No other support person at this time. All questions and concerns answered, family to contact [facility] services PRN.  Consult Status: NICU follow-up NICU Follow-up type: Verify absence of engorgement; Verify onset of copious milk   [place] Valli S Kimbery Harwood 06/25/2024, 3:35 PM

## 2024-06-27 ENCOUNTER — Telehealth (HOSPITAL_COMMUNITY): Payer: Self-pay

## 2024-06-27 DIAGNOSIS — Z1331 Encounter for screening for depression: Secondary | ICD-10-CM

## 2024-06-27 NOTE — Telephone Encounter (Signed)
 Hospital inpatient EPDS = 10. CSW consult completed during hospital stay. Ambulatory referral to [facility] placed now. Notified Dr. Eveline via chart or [facility] referral.   Victoria [name] 06-27-24,1309

## 2024-06-28 NOTE — Lactation Note (Signed)
 This note was copied from a baby's chart.  NICU Lactation Consultation Note  Patient Name: Victoria [name] Date: 06/28/2024 Age:32 days  Reason for consult: Follow-up assessment; NICU baby; Mother's request; 1st time breastfeeding; Primapara; Early term 37-38.6wks (NP request)  SUBJECTIVE Visited with family of 47 70/35 weeks old NICU female Victoria [name]; Victoria [name] is a P1 and reported she's been pumping consistently, her milk came in, praised her for all her efforts. She noted some discomfort on her nipples and overall breast (see maternal assessment). Provided ice packs and resized her flanges; baby just had 35 ml of EBM; asked her to call for latch assistance when needed.   OBJECTIVE Infant data: Mother's Current Feeding Choice: Breast Milk and Formula  O2 Device: Room Air  Infant feeding assessment IDFTS - Readiness: 1 IDFTS - Quality: 2   Maternal data: G1P1001 Vaginal, Spontaneous Pumping frequency: 8 times/24 hours Pumped volume: 30 mL (30-60 ml) Flange Size: 18 (resized to # 18 flanges on 06/28/2024)  Pump: Hands Free, Personal (MomCozy wearable)  ASSESSMENT Infant: Feeding Status: Ad lib Feeding method: Bottle Nipple Type: Dr. Jonna Preemie  Maternal: Milk volume: Normal No S/S of engorgement at this time but breasts are filling in R side had knots in multiple areas L nipple has small scabs but they were already healing   INTERVENTIONS/PLAN Interventions: Interventions: Breast feeding basics reviewed; Coconut oil; DEBP; Education; Ice Discharge Education: Engorgement and breast care Tools: Flanges  Plan: STS around care times Pump both breasts on maintain mode every 3 hours for 15-30 minutes, ideally 8 pumping sessions/24 hours Ice her breasts PRN Offer the breast on feeding cues if desired Family will continue advancing on bottle feedings   Female visitor present. All questions and concerns answered, family to contact [facility] services PRN.   Consult  Status: NICU follow-up NICU Follow-up type: Weekly NICU follow up   Stclair Szymborski GORMAN Crate 06/28/2024, 5:38 PM

## 2024-06-30 NOTE — Lactation Note (Signed)
 This note was copied from a baby's chart.  NICU Lactation Consultation Note  Patient Name: Victoria [name] Unijb'd Date: 06/30/2024 Age:33 years  Reason for consult: Weekly NICU follow-up; NICU baby; Mother's request; Primapara; 1st time breastfeeding; Early term 16-38.6wks; RN request  SUBJECTIVE Visited with family of 72 9/17 weeks old NICU female Damarious; NICU RN Izetta called out for lactation because Makylie had questions regarding her supply. She voiced it's been challenging to follow the 3 hours schedule, but whenever she comes to the hospital she gets more milk whether is after doing STS with baby or when using the hospital grade pump. Noticed that she has a wearable pump at home. Revised strategies to increase supply such as engaging in STS care with baby and then pumping right after, using a hospital grade pump and waking up at least once in the middle of the night pump. Let her know that the best chance to develop a full supply is now during her 2 week calibration period; frequent pumping will keep prolactin levels higher.  OBJECTIVE Infant data: Mother's Current Feeding Choice: Breast Milk and Formula  O2 Device: Room Air  Infant feeding assessment IDFTS - Readiness: 1 IDFTS - Quality: 2   Maternal data: G1P1001 Vaginal, Spontaneous Pumping frequency: 5 times/24 hours Pumped volume: 60 mL (60-120 ml)  Pump: Hands Free, Personal (MomCozy wearable)  ASSESSMENT Infant: Feeding Status: Ad lib Feeding method: Bottle Nipple Type: Dr. Jonna Preemie  Maternal: Milk volume: Normal  INTERVENTIONS/PLAN Interventions: Interventions: Breast feeding basics reviewed; Coconut oil; DEBP; Education  Plan: STS around care times; then pump right after Pump both breasts on maintain mode every 3 hours for 15-30 minutes, ideally 8 pumping sessions/24 hours Power pump once/day Offer the breast on feeding cues if desired Family will continue advancing on bottle feedings   MGM  present. All questions and concerns answered, family to contact [facility] services PRN.   Consult Status: NICU follow-up NICU Follow-up type: Baby's discharge   Danese Dorsainvil GORMAN Crate 06/30/2024, 6:50 PM

## 2024-07-02 ENCOUNTER — Telehealth (HOSPITAL_COMMUNITY): Payer: Self-pay | Admitting: *Deleted

## 2024-07-02 NOTE — Telephone Encounter (Signed)
 07/02/2024  Name: Victoria [name] MRN: [id number] DOB: August 17, 1991  Reason for Call:  Transition of Care Hospital Discharge Call  Contact Status: Patient Contact Status: Complete  Language assistant needed: Interpreter Mode: Interpreter Not Needed        Follow-Up Questions: Do You Have Any Concerns About Your Health As You Heal From Delivery?: Yes What Concerns Do You Have About Your Health?: Patient asked if it was normal to still be having some vaginal bleeding.  We discussed normals for lochia and parameters for calling provider. Do You Have Any Concerns About Your Infants Health?: Infant in NICU  Edinburgh Postnatal Depression Scale:  In the Past 7 Days: I have been able to laugh and see the funny side of things.: Not quite so much now I have looked forward with enjoyment to things.: As much as I ever did I have blamed myself unnecessarily when things went wrong.: Yes, most of the time I have been anxious or worried for no good reason.: Yes, sometimes I have felt scared or panicky for no good reason.: No, not at all Things have been getting on top of me.: No, I have been coping as well as ever I have been so unhappy that I have had difficulty sleeping.: Yes, sometimes I have felt sad or miserable.: No, not at all I have been so unhappy that I have been crying.: No, never The thought of harming myself has occurred to me.: Never Edinburgh Postnatal Depression Scale Total: 8  PHQ2-9 Depression Scale:     Discharge Follow-up: Edinburgh score requires follow up?: No Patient was advised of the following resources:: Support Group, Breastfeeding Support Group  Post-discharge interventions: Reviewed Newborn Safe Sleep Practices  Mliss Sieve, RN 07/02/2024 11:52

## 2024-07-02 NOTE — Lactation Note (Signed)
 This note was copied from a baby's chart.  NICU Lactation Consultation Note  Patient Name: Victoria [name] Unijb'd Date: 07/02/2024 Age:32 days  Reason for consult: Follow-up assessment; RN request; Breastfeeding assistance; Primapara; 1st time breastfeeding; Term; NICU baby  SUBJECTIVE  LC in to assist with baby Wonda latching to the breast for the first time.  Baby has had bottles and is ad lib feeding now.  Mom has been pumping consistently and expressing 90 ml each session.  She is using a hands free pump at home, but will be picking up her WIC pump today.    LC assisted with baby being in football hold on left breast.  Baby sucking on pacifier and became frantic and fussy when pacifier removed.  After a few attempts and teaching done, LC initiated a 16 mm nipple shield, showing Mom how to properly apply to draw nipple into shield.  Baby remained hyper rooting and unable to maintain a latch to the breast.  LC instilled 2 ml of EBM into shield and he settled into a mostly non-nutritive suck pattern.    Mom voiced that she knows the draw of breastfeeding, but she doesn't have the patience for this.  LC empathized with her and reminded her that baby has been through a lot as she has.  Encouraged lots of STS reclined in the chair to calm baby.  Recommended offering the breast AFTER a bottle as well.  LC recommended avoiding the stress he was put through at this session, but help him see the breast as comfort.  Mom also is aware of OP lactation support after baby is discharged as many babies who spend time in the NICU, benefit from OP support.    Plan recommended- 1- STS with baby 2- Offer the breast with cues, using the 16 mm nipple shield to assist with latch 3- if baby is beyond comforting and won't latch, offer a paced bottle first and then try to latch to breast again. 4- continue consistent pumping to support a full milk supply. 5- OP lactation recommended  OBJECTIVE Infant  data: No data recorded O2 Device: Room Air  Infant feeding assessment IDFTS - Readiness: 1 IDFTS - Quality: 2   Maternal data: G1P1001 Vaginal, Spontaneous Pumping frequency: 8 times per 24 hrs Pumped volume: 90 mL Flange Size: 18 Hands-free pumping top sizes: Large Alejos)  WIC Program: Yes WIC Referral Sent?: Yes What county?: [name] Pump: WIC Pump (receiving her WIC pump today)  ASSESSMENT Infant: Latch: Repeated attempts needed to sustain latch, nipple held in mouth throughout feeding, stimulation needed to elicit sucking reflex. Audible Swallowing: A few with stimulation (with instilled EBM only) Type of Nipple: Everted at rest and after stimulation (short nipples, compressible areola) Comfort (Breast/Nipple): Soft / non-tender Hold (Positioning): Full assist, staff holds infant at breast LATCH Score: 6  Feeding Status: Ad lib Feeding method: Breast Nipple Type: Dr. Jonna Preemie  Maternal: Milk volume: Normal  INTERVENTIONS/PLAN Interventions: Interventions: Breast feeding basics reviewed; Assisted with latch; Skin to skin; Breast massage; Hand express; Breast compression; Adjust position; Support pillows; Position options; DEBP; Education Pump Education: Setup, frequency, and cleaning; Milk Storage  Plan: Consult Status: NICU follow-up NICU Follow-up type: Baby's discharge; Verify DEBP issuance   Claudene Aleck BRAVO 07/02/2024, 10:50 AM

## 2024-07-03 ENCOUNTER — Ambulatory Visit: Payer: Self-pay

## 2024-07-03 NOTE — Telephone Encounter (Signed)
 FYI Only or Action Required?: FYI only for provider.  Patient was last seen in primary care on 11/16/2021 by Marlee Lynwood NOVAK, MD.  Called Nurse Triage reporting hands tingling.  Symptoms began several months ago.  Interventions attempted: Nothing.  Symptoms are: unchanged.  Triage Disposition: See PCP When Office is Open (Within 3 Days)  Patient/caregiver understands and will follow disposition?: Yes  Copied from CRM [id number]. Topic: Clinical - Red Word Triage >> Jul 03, 2024  9:43 AM Victoria [name] wrote: New patient having numbness in both hands for 2 months - requested appt Reason for Disposition  [1] Numbness or tingling on both sides of body AND [2] is a new symptom present > 24 hours  Answer Assessment - Initial Assessment Questions Patient will reach out to OB to report that symptoms persist  1. SYMPTOM: What is the main symptom you are concerned about? (e.g., weakness, numbness)     Tingling of both hands 2. ONSET: When did this start? (e.g., minutes, hours, days; while sleeping)     Two months ago in last trimester of pregnancy. Reported to OB who reassured her that it would resolve after delivery, but tingling has continued.   4. PATTERN Does this come and go, or has it been constant since it started?  Is it present now?     constant 5. CARDIAC SYMPTOMS: Have you had any of the following symptoms: chest pain, difficulty breathing, palpitations?     denies 6. NEUROLOGIC SYMPTOMS: Have you had any of the following symptoms: headache, dizziness, vision loss, double vision, changes in speech, unsteady on your feet?     denies 7. OTHER SYMPTOMS: Do you have any other symptoms?     denies 8. PREGNANCY: Is there any chance you are pregnant? When was your last menstrual period?     Recently delivered 06/22/24  Protocols used: Neurologic Deficit-A-AH

## 2024-07-07 ENCOUNTER — Encounter: Admitting: Licensed Clinical Social Worker
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
## 2024-07-16 ENCOUNTER — Encounter (HOSPITAL_COMMUNITY): Payer: Self-pay | Admitting: Psychiatry

## 2024-07-16 ENCOUNTER — Telehealth (HOSPITAL_COMMUNITY): Payer: MEDICAID | Admitting: Psychiatry

## 2024-07-16 DIAGNOSIS — F3341 Major depressive disorder, recurrent, in partial remission: Secondary | ICD-10-CM | POA: Diagnosis not present

## 2024-07-16 DIAGNOSIS — R41840 Attention and concentration deficit: Secondary | ICD-10-CM | POA: Diagnosis not present

## 2024-07-16 DIAGNOSIS — F4310 Post-traumatic stress disorder, unspecified: Secondary | ICD-10-CM | POA: Diagnosis not present

## 2024-07-16 DIAGNOSIS — F1021 Alcohol dependence, in remission: Secondary | ICD-10-CM

## 2024-07-16 DIAGNOSIS — F4010 Social phobia, unspecified: Secondary | ICD-10-CM

## 2024-07-16 MED ORDER — GABAPENTIN 300 MG PO CAPS: 300.0000 mg | ORAL_CAPSULE | Freq: Every day | ORAL | 1 refills | Status: DC | PRN

## 2024-07-16 NOTE — Patient Instructions (Signed)
 Thank you for attending your appointment today.  -- INCREASE gabapentin  to 300 mg daily as needed for anxiety -- Continue other medications as prescribed.  Please do not make any changes to medications without first discussing with your provider. If you are experiencing a psychiatric emergency, please call 911 or present to your nearest emergency department. Additional crisis, medication management, and therapy resources are included below.  [facility]  [address] [place], [place] [address] [phone] WALK-IN URGENT CARE 24/7 FOR ANYONE [address] [place], [place]  [phone] Fax: [phone] [email or web address] *Interpreters available *Accepts all insurance and uninsured for Urgent Care needs *Accepts Medicaid and uninsured for outpatient treatment (below)      ONLY FOR [facility]  Below:    Outpatient New Patient Assessment/Therapy Walk-ins:        Monday, Wednesday, and Thursday 8am until slots are full (first come, first served)                   New Patient Psychiatry/Medication Management        Monday-Friday 8am-11am (first come, first served)               For all walk-ins we ask that you arrive by 7:15am, because patients will be seen in the order of arrival.

## 2024-07-24 ENCOUNTER — Encounter: Payer: Self-pay | Admitting: Student in an Organized Health Care Education/Training Program

## 2024-07-24 ENCOUNTER — Ambulatory Visit (INDEPENDENT_AMBULATORY_CARE_PROVIDER_SITE_OTHER): Payer: MEDICAID | Admitting: Student in an Organized Health Care Education/Training Program

## 2024-07-24 VITALS — BP 115/74 | HR 105 | Temp 98.8°F | Resp 16 | Ht 64.0 in | Wt 154.8 lb

## 2024-07-24 DIAGNOSIS — G5600 Carpal tunnel syndrome, unspecified upper limb: Secondary | ICD-10-CM | POA: Diagnosis not present

## 2024-07-24 DIAGNOSIS — O26899 Other specified pregnancy related conditions, unspecified trimester: Secondary | ICD-10-CM

## 2024-07-24 DIAGNOSIS — F3341 Major depressive disorder, recurrent, in partial remission: Secondary | ICD-10-CM

## 2024-07-24 DIAGNOSIS — O99355 Diseases of the nervous system complicating the puerperium: Secondary | ICD-10-CM

## 2024-07-24 NOTE — Progress Notes (Signed)
 New Patient Office Visit  Subjective    Patient ID: Victoria [name], female    DOB: [birth year]  Age: 33 y.o. MRN: [id number]  CC:  Chief Complaint  Patient presents with   New Patient (Initial Visit)    Here for establish care, no previous pcp as an adult    HPI  Discussed the use of AI scribe software for clinical note transcription with the patient, who gave verbal consent to proceed.  History of Present Illness Victoria [name] is a 33 year old female who presents with numbness and tingling in both hands postpartum. She is accompanied by her mother and her one-month-old son, Demarius.  She has been experiencing numbness and tingling in both hands, sparing the pinkies, since the third trimester of her pregnancy. The symptoms have persisted postpartum and are described as constant tingling with numbness occurring when holding objects or performing activities like washing dishes. The right hand is more severely affected than the left.  She has been using an Ace wrap around her wrists during the day but not at night. She has not been using wrist splints. Her psychiatrist prescribed gabapentin  for anxiety, which she started this year.  Her past medical history includes scoliosis with spinal fusion surgery at age 33 and anxiety, currently managed with Zoloft  and gabapentin . She was previously on Seroquel  and trazodone  until the birth of her son.  No fevers, chills, weakness in hands or legs, changes in walking, or cold intolerance. She reports sleep deprivation and mood fluctuations, which she attributes to postpartum changes. She did not have gestational diabetes and her blood pressure was normal during pregnancy.  She delivered her son vaginally on June 22, 2024, and he was in the NICU for two weeks due to meconium aspiration. 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    Outpatient Encounter Medications as of 07/24/2024  Medication  Sig   gabapentin  (NEURONTIN ) 300 MG capsule Take 1 capsule (300 mg total) by mouth daily as needed (anxiety).   Prenatal Vit-Fe Fumarate-FA (PRENATAL VITAMINS PLUS PO) Take by mouth.   sertraline  (ZOLOFT ) 100 MG tablet Take 1.5 tablets (150 mg total) by mouth daily.   [DISCONTINUED] ibuprofen  (ADVIL ) 600 MG tablet Take 1 tablet (600 mg total) by mouth every 6 (six) hours.   [DISCONTINUED] rizatriptan (MAXALT-MLT) 5 MG disintegrating tablet Take 5 mg by mouth daily as needed for migraine.   [DISCONTINUED] valACYclovir  (VALTREX ) 500 MG tablet Take 1 tablet (500 mg total) by mouth 2 (two) times daily.   No facility-administered encounter medications on file as of 07/24/2024.    Past Medical History:  Diagnosis Date   Alcohol use disorder, severe, in early remission (HCC) 07/08/2017   Anxiety    Carpal tunnel syndrome of right wrist 06/10/2019   Depression    Genital herpes    History of chronic hypertension 12/24/2023   'long time ago' never on meds    ASA, baseline labs wnl     History of scoliosis 04/22/2024   HPV (human papilloma virus) anogenital infection    Hypertension 06/22/2017   Migraines    PTSD (post-traumatic stress disorder)     Past Surgical History:  Procedure Laterality Date   BACK SURGERY  2011   scoliosis repair   DG SCOLIOSIS SERIES ([facility] HX)      Family History  Problem Relation Age of Onset   Healthy Mother    Hypertension Father    Multiple  sclerosis Sister    Schizophrenia Maternal Grandmother    Hypertension Paternal Grandfather    Diabetes Maternal Aunt    Diabetes Maternal Uncle        Objective    BP 115/74 (BP Location: Right Arm, Patient Position: Sitting, Cuff Size: Normal)   Pulse (!) 105   Temp 98.8 F (37.1 C) (Oral)   Resp 16   Ht 5' 4 (1.626 m)   Wt 154 lb 12.8 oz (70.2 kg)   LMP 08/29/2023   SpO2 100%   BMI 26.57 kg/m   Physical Exam  Gen: Well-appearing young woman Eyes: Normal Neck: Normal thyroid, no nodules or  adenopathy Heart: Regular, no murmur Lungs: Unlabored, clear throughout Ext: Warm, no edema, normal joints Hands: Discomfort on both hands is on digits 1 through 3.  Positive Tinel's testing on both hands causing radicular pain into the fingers.  No discomfort over the elbow. Neuro: Alert, conversational, full strength upper and lower extremities, normal gait and balance Psych: Distressed appearing, not anxious or depressed appearing, normal speech, organized thoughts     Assessment & Plan:    Problem List Items Addressed This Visit       High   MDD (major depressive disorder), recurrent, in partial remission - Primary (Chronic)   Managed with Zoloft  and gabapentin , her symptoms include mood instability, likely exacerbated by postpartum sleep deprivation. She is under psychiatrist care with a recent appointment, and gabapentin  is prescribed for anxiety. Continue Zoloft  and gabapentin , looks well compensated today, mood issues seem under good control with current medications.  No recent side effects and has good support from her mother and partner.        Medium    Carpal tunnel syndrome during pregnancy (Chronic)   This condition is likely due to fluid retention and hormonal changes in pregnancy, causing tingling and numbness in the thumb, index, middle, and half of the ring fingers, sparing the pinkies, with more pronounced symptoms in the right hand. Improvement is expected post-pregnancy. Recommended wrist splints, especially at night, and advised over-the-counter ibuprofen  (400 mg) two to three times daily for a few days. 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        Return if symptoms worsen or fail to improve.   Cleatus Debby Specking, MD

## 2024-07-24 NOTE — Assessment & Plan Note (Signed)
 This condition is likely due to fluid retention and hormonal changes in pregnancy, causing tingling and numbness in the thumb, index, middle, and half of the ring fingers, sparing the pinkies, with more pronounced symptoms in the right hand. Improvement is expected post-pregnancy. Recommended wrist splints, especially at night, and advised over-the-counter ibuprofen  (400 mg) two to three times daily for a few days. 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

## 2024-07-24 NOTE — Assessment & Plan Note (Addendum)
 Managed with Zoloft  and gabapentin , her symptoms include mood instability, likely exacerbated by postpartum sleep deprivation. She is under psychiatrist care with a recent appointment, and gabapentin  is prescribed for anxiety. Continue Zoloft  and gabapentin , looks well compensated today, mood issues seem under good control with current medications.  No recent side effects and has good support from her mother and partner.

## 2024-07-24 NOTE — Patient Instructions (Signed)
  VISIT SUMMARY: Today, you were seen for numbness and tingling in both hands that started during your pregnancy and has continued postpartum. We discussed your symptoms and reviewed your current medications and history.  YOUR PLAN: -BILATERAL PREGNANCY-RELATED CARPAL TUNNEL SYNDROME: This condition is likely due to fluid retention and hormonal changes in pregnancy, causing tingling and numbness in the thumb, index, middle, and half of the ring fingers, sparing the pinkies. It is more pronounced in your right hand. We recommend using wrist splints, especially at night, and taking over-the-counter ibuprofen  (400 mg) two to three times daily for a few days. If symptoms persist or worsen, we may consider steroid injections or surgery.  -MAJOR DEPRESSIVE DISORDER: This condition involves persistent feelings of sadness and loss of interest. Your symptoms include mood instability, likely exacerbated by postpartum sleep deprivation. You are currently managing this with Zoloft  and gabapentin , and you are under the care of a psychiatrist. Continue with your current medications and follow up with your psychiatrist as needed.  INSTRUCTIONS: Please use wrist splints at night and take ibuprofen  as recommended. Follow up with your psychiatrist as needed for your mood symptoms. If your hand symptoms persist or worsen, contact us  to discuss further treatment options such as steroid injections or surgery.

## 2024-08-04 ENCOUNTER — Ambulatory Visit: Payer: MEDICAID | Admitting: Obstetrics

## 2024-08-04 ENCOUNTER — Ambulatory Visit: Payer: MEDICAID | Admitting: Obstetrics and Gynecology

## 2024-08-04 DIAGNOSIS — F411 Generalized anxiety disorder: Secondary | ICD-10-CM

## 2024-08-04 DIAGNOSIS — G5603 Carpal tunnel syndrome, bilateral upper limbs: Secondary | ICD-10-CM | POA: Diagnosis not present

## 2024-08-04 DIAGNOSIS — F3341 Major depressive disorder, recurrent, in partial remission: Secondary | ICD-10-CM | POA: Diagnosis not present

## 2024-08-04 MED ORDER — BUPROPION HCL ER (XL) 150 MG PO TB24: 150.0000 mg | ORAL_TABLET | Freq: Every day | ORAL | 3 refills | Status: DC

## 2024-08-04 MED ORDER — HYDROXYZINE HCL 25 MG PO TABS: 25.0000 mg | ORAL_TABLET | Freq: Three times a day (TID) | ORAL | 0 refills | Status: DC | PRN

## 2024-08-04 NOTE — Progress Notes (Signed)
 Post Partum Visit Note  Victoria [name] is a 33 y.o. G54P1001 female who presents for a postpartum visit. She is 6 weeks postpartum following a normal spontaneous vaginal delivery.  I have fully reviewed the prenatal and intrapartum course. The delivery was at 38 gestational weeks.  Anesthesia: epidural. Postpartum course has been good. Baby is doing well.Victoria [name] is feeding by both. Bleeding no bleeding. Bowel function is normal. Bladder function is normal. Contraception method is nexplanon . Postpartum depression screening: positive.   Upstream - 08/04/24 1005       Pregnancy Intention Screening   Does the patient want to become pregnant in the next year? N/A    Does the patient's partner want to become pregnant in the next year? N/A    Would the patient like to discuss contraceptive options today? N/A      Contraception Wrap Up   Current Method Hormonal Implant    End Method Hormonal Implant    Contraception Counseling Provided No         The pregnancy intention screening data noted above was reviewed. Potential methods of contraception were discussed. The patient elected to proceed with Hormonal Implant.   Edinburgh Postnatal Depression Scale - 08/04/24 1002       Edinburgh Postnatal Depression Scale:  In the Past 7 Days   I have been able to laugh and see the funny side of things. 2    I have looked forward with enjoyment to things. 0    I have blamed myself unnecessarily when things went wrong. 2    I have been anxious or worried for no good reason. 0    I have felt scared or panicky for no good reason. 2    Things have been getting on top of me. 2    I have been so unhappy that I have had difficulty sleeping. 2    I have felt sad or miserable. 2    I have been so unhappy that I have been crying. 2    The thought of harming myself has occurred to me. 0    Edinburgh Postnatal Depression Scale Total 14          Health Maintenance Due  Topic Date Due    Pneumococcal Vaccine (1 of 2 - PCV) Never done   Hepatitis B Vaccines 19-59 Average Risk (1 of 3 - 19+ 3-dose series) Never done   HPV VACCINES (1 - 3-dose SCDM series) Never done   Influenza Vaccine  05/22/2024   COVID-19 Vaccine (3 - 2025-26 season) 06/22/2024    The following portions of the patient's history were reviewed and updated as appropriate: allergies, current medications, past family history, past medical history, past social history, past surgical history, and problem list.  Review of Systems Pertinent items are noted in HPI.  Objective:  BP 114/67 (BP Location: Right Arm, Patient Position: Sitting, Cuff Size: Normal)   Pulse 97   Ht 5' 4 (1.626 m)   Wt 157 lb (71.2 kg)   LMP 08/29/2023   Breastfeeding Yes   BMI 26.95 kg/m    General:  alert and cooperative   Breasts:  not indicated  Lungs: Normal effort     Abdomen: nondistended   Wound N/a  GU exam:  not indicated       Assessment:  1. Postpartum exam (Primary)   2. Generalized anxiety disorder, r/o substance/medication-induced anxiety disorder 3. MDD (major depressive disorder), recurrent, in partial remission -Has follow up appt  with psychiatry 11/15 -Sees a counselor that is helpful -Feels that zoloft  and gabapentin  are not helping enough -Discussed trying different medication vs adding in another medication, will trial wellbutrin, discussed SE to follow up on if symptoms worsen -Desires trial of hydroxyzine  as needed instead of gabapentin  -she was previously on seroquel  and trazadone for sleep which she does not want to do right now with newborn  - hydrOXYzine  (ATARAX ) 25 MG tablet; Take 1 tablet (25 mg total) by mouth 3 (three) times daily as needed.  Dispense: 30 tablet; Refill: 0 - buPROPion (WELLBUTRIN XL) 150 MG 24 hr tablet; Take 1 tablet (150 mg total) by mouth daily.  Dispense: 30 tablet; Refill: 3  4. Bilateral carpal tunnel syndrome Discussed bilateral wrist braces daily Continue OTC  medication If worsens despite daily wrist braces x 1 month, can consider injections      Plan:   Essential components of care per ACOG recommendations:  1.  Mood and well being: Patient with positive depression screening today. Reviewed local resources for support.    2. Infant care and feeding:  -Patient currently breastmilk feeding? yes  -Social determinants of health (SDOH) reviewed in EPIC. No concerns  3. Sexuality, contraception and birth spacing - Patient does not want a pregnancy in the next year.   - Reviewed reproductive life planning. Reviewed contraceptive methods based on pt preferences and effectiveness.  Patient desired Hormonal Implant today.   - Discussed birth spacing of 18 months  4. Sleep and fatigue -Encouraged family/partner/community support of 4 hrs of uninterrupted sleep to help with mood and fatigue  5. Physical Recovery  - Discussed patients delivery and complications. She describes her labor as good. - Patient had a Vaginal, no problems at delivery. Patient had a 1st degree laceration. Perineal healing reviewed. Patient expressed understanding - Patient has urinary incontinence? No. - Patient is safe to resume physical and sexual activity  6.  Health Maintenance - HM due items addressed Yes - Last pap smear  Diagnosis  Date Value Ref Range Status  12/24/2023   Final   - Negative for intraepithelial lesion or malignancy (NILM)   Pap smear not done at today's visit.  -Breast Cancer screening indicated? No.   7. Chronic Disease/Pregnancy Condition follow up: None  - PCP follow up  Nidia Daring, FNP [facility] [employer]

## 2024-08-07 ENCOUNTER — Ambulatory Visit: Payer: MEDICAID | Admitting: Family Medicine

## 2024-08-19 ENCOUNTER — Encounter: Payer: Self-pay | Admitting: Women's Health

## 2024-08-20 ENCOUNTER — Ambulatory Visit (HOSPITAL_COMMUNITY): Payer: MEDICAID

## 2024-08-20 ENCOUNTER — Encounter (HOSPITAL_COMMUNITY): Payer: Self-pay

## 2024-08-27 ENCOUNTER — Other Ambulatory Visit: Payer: Self-pay | Admitting: Obstetrics and Gynecology

## 2024-08-27 DIAGNOSIS — F3341 Major depressive disorder, recurrent, in partial remission: Secondary | ICD-10-CM
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
## 2024-09-08 ENCOUNTER — Encounter (HOSPITAL_COMMUNITY): Payer: Self-pay | Admitting: Psychiatry

## 2024-09-08 ENCOUNTER — Ambulatory Visit (INDEPENDENT_AMBULATORY_CARE_PROVIDER_SITE_OTHER): Payer: MEDICAID | Admitting: Psychiatry

## 2024-09-08 DIAGNOSIS — F1020 Alcohol dependence, uncomplicated: Secondary | ICD-10-CM | POA: Diagnosis not present

## 2024-09-08 DIAGNOSIS — F4010 Social phobia, unspecified: Secondary | ICD-10-CM | POA: Diagnosis not present

## 2024-09-08 DIAGNOSIS — F331 Major depressive disorder, recurrent, moderate: Secondary | ICD-10-CM | POA: Diagnosis not present

## 2024-09-08 DIAGNOSIS — F4310 Post-traumatic stress disorder, unspecified: Secondary | ICD-10-CM | POA: Diagnosis not present

## 2024-09-08 MED ORDER — FLUOXETINE HCL 20 MG PO CAPS: 20.0000 mg | ORAL_CAPSULE | Freq: Every morning | ORAL | 2 refills | Status: DC

## 2024-09-08 MED ORDER — GABAPENTIN 300 MG PO CAPS: 300.0000 mg | ORAL_CAPSULE | Freq: Two times a day (BID) | ORAL | 2 refills | Status: DC | PRN

## 2024-09-08 MED ORDER — NALTREXONE HCL 50 MG PO TABS: ORAL_TABLET | ORAL | 2 refills | Status: DC

## 2024-09-08 NOTE — Patient Instructions (Addendum)
 Thank you for attending your appointment today.  -- SWITCH from Zoloft  to Prozac  as follows: Week 1: DECREASE Zoloft  to 150 mg daily  Week 2:  DECREASE Zoloft  to 100 mg daily START Prozac  20 mg daily  Week 3: DECREASE Zoloft  to 50 mg daily CONTINUE Prozac  20 mg daily  Week 4:  STOP Zoloft  CONTINUE Prozac  20 mg daily  -- Take naltrexone  50 mg daily for 1 week then INCREASE to 100 mg daily -- INCREASE gabapentin  to 300 mg twice daily as needed for anxiety -- Continue other medications as prescribed.  Please do not make any changes to medications without first discussing with your provider. If you are experiencing a psychiatric emergency, please call 911 or present to your nearest emergency department. Additional crisis, medication management, and therapy resources are included below.  [facility]  [address] [place], [place] [address] [phone] WALK-IN URGENT CARE 24/7 FOR ANYONE [address], [place], [place]  [phone] Fax: [phone] [email or web address] *Interpreters available *Accepts all insurance and uninsured for Urgent Care needs *Accepts Medicaid and uninsured for outpatient treatment (below)      ONLY FOR [facility]  Below:    Outpatient New Patient Assessment/Therapy Walk-ins:        Monday, Wednesday, and Thursday 8am until slots are full (first come, first served)                   New Patient Psychiatry/Medication Management        Monday-Friday 8am-11am (first come, first served)               For all walk-ins we ask that you arrive by 7:15am, because patients will be seen in the order of arrival.

## 2024-09-09 DIAGNOSIS — F1020 Alcohol dependence, uncomplicated: Secondary | ICD-10-CM | POA: Insufficient documentation

## 2024-10-05 ENCOUNTER — Other Ambulatory Visit (HOSPITAL_COMMUNITY): Payer: Self-pay | Admitting: Psychiatry

## 2024-10-05 ENCOUNTER — Telehealth (HOSPITAL_COMMUNITY): Payer: Self-pay

## 2024-10-05 MED ORDER — FLUOXETINE HCL 20 MG PO CAPS: 20.0000 mg | ORAL_CAPSULE | Freq: Every morning | ORAL | 2 refills | Status: DC

## 2024-10-05 NOTE — Telephone Encounter (Signed)
 FLUoxetine  (PROZAC ) 20 MG capsule 20 mg, Every morning         Summary:Take 1 capsule (20 mg total) by mouth in the morning. To start upon decreasing Zoloft  to 100 mg daily., Starting Tue 09/08/2024, Normal Dose, Route, Frequency:20 mg, Oral, Every morningStart:11/18/2025Ordered On:11/18/2025Pharmacy:[id number] - [place], [place] - [address]Report[address]x Associated:Taking:Long-term:Med Note:       Change Directions:Take 1 capsule (20 mg total) by mouth in the morning. To start upon decreasing Zoloft  to 100 mg daily. Ordering Department:[facility] Authorized Ab:[id number], Sarah A Dispense:30 capsule Refills:2 ordered Admin Instructions:To start upon decreasing Zoloft  to 100 mg daily.

## 2024-10-05 NOTE — Telephone Encounter (Signed)
 Medication sent to preferred pharmacy

## 2024-10-19 ENCOUNTER — Encounter (HOSPITAL_COMMUNITY): Payer: Self-pay | Admitting: Psychiatry

## 2024-10-19 ENCOUNTER — Telehealth (INDEPENDENT_AMBULATORY_CARE_PROVIDER_SITE_OTHER): Payer: MEDICAID | Admitting: Psychiatry

## 2024-10-19 DIAGNOSIS — F1020 Alcohol dependence, uncomplicated: Secondary | ICD-10-CM

## 2024-10-19 DIAGNOSIS — F4310 Post-traumatic stress disorder, unspecified: Secondary | ICD-10-CM

## 2024-10-19 DIAGNOSIS — F331 Major depressive disorder, recurrent, moderate: Secondary | ICD-10-CM | POA: Diagnosis not present

## 2024-10-19 MED ORDER — ARIPIPRAZOLE 5 MG PO TABS: 5.0000 mg | ORAL_TABLET | Freq: Every day | ORAL | 3 refills | Status: DC

## 2024-10-19 MED ORDER — NALTREXONE HCL 50 MG PO TABS: ORAL_TABLET | ORAL | 3 refills | Status: DC

## 2024-10-19 MED ORDER — SERTRALINE HCL 100 MG PO TABS: 200.0000 mg | ORAL_TABLET | Freq: Every day | ORAL | 3 refills | Status: DC

## 2024-10-19 MED ORDER — GABAPENTIN 300 MG PO CAPS: 300.0000 mg | ORAL_CAPSULE | Freq: Three times a day (TID) | ORAL | 3 refills | Status: DC

## 2024-10-19 NOTE — Progress Notes (Signed)
 BH MD/PA/NP OP Progress Note Virtual Visit via Video Note  I connected with Victoria [name] on 10/19/2024 at  3:00 PM EST by a video enabled telemedicine application and verified that I am speaking with the correct person using two identifiers.  Location: Patient: Home Provider: Clinic   I discussed the limitations of evaluation and management by telemedicine and the availability of in person appointments. The patient expressed understanding and agreed to proceed.  I provided 30 minutes of non-face-to-face time during this encounter.    10/19/2024 3:36 PM Victoria Jaleya Pebley  MRN:  [id number]  Chief Complaint:  I am still depressed  HPI:  33 y.o. female seen today for follow up psychiatric evaluation. She has a psychiatric history of alcohol use disorder, generalized anxiety disorder, panic disorder, PTSD, and tobacco use disorder and a historical diagnosis of schizoaffective disorder, bipolar II disorder, and ADHD inattentive type. Currently she is managed on Prozac  20 mg daily, Naltrexone  100 mg daily and gabapentin  300 mg twice daily.   She notes that she discontinued Prozac  and has been taking Zoloft  150 mg. She also notes that she has been taking gabapentin  tree times daily instead of twice daily. She reports that her medications are somewhat effective in managing her psychiatric conditions.  Today she was well-groomed, pleasant, cooperative, engaged in conversation.  She informed clinical research associate that she is enjoying being a mother.  Patient however reports that she continues to suffer from depression.  She informed clinical research associate that she discontinued Prozac  as she found it ineffective and notes that Zoloft  is somewhat effective.  Patient notes that she likes Seroquel  however informed writer that it caused her to be oversedated.  Provider recommended trialing Abilify.  Patient reports that she tried in the past and liked it but had to discontinue it because it was too expensive.    Recently  patient notes that she has been worried about her health, her  62-month-year-old son, and her family members.  Recently she notes that she had a loved one to passed away.  She notes that her significant other is supportive but works a lot. Today provider conducted a GAD 7  and patient scored a 16. Provider also conducted a PHQ 9 and patient scored a 20. She notes that her sleep is poor. She notes that she sleeps 2 hours. Patient reports that Naltrexone  makes her sleepy. Provider recommenced patient taking Naltrexone  at night. She endorsed understanding and agreed.   Patient note that she has been craving alcohol. She notes that she drink occasionally. Today provider conducted an Audit assessment and patient scored a 4.    Today patient request that Zoloft  to be increased.  Zoloft  150 increased to 200 mg daily.  Patient also agreeable to starting Abilify 5 mg daily to help manage depression.  Patient has been taking gabapentin  3 times daily instead of twice daily.  At this time provider agreeable to continuing this.  She will take naltrexone  at night. She will continue her other medications as prescribed. No other concerns noted at this time.  Visit Diagnosis:    ICD-10-CM   1. MDD (major depressive disorder), recurrent episode, moderate (HCC)  F33.1 sertraline  (ZOLOFT ) 100 MG tablet    ARIPiprazole (ABILIFY) 5 MG tablet    2. Alcohol use disorder, moderate, dependence (HCC)  F10.20 naltrexone  (DEPADE) 50 MG tablet    gabapentin  (NEURONTIN ) 300 MG capsule    sertraline  (ZOLOFT ) 100 MG tablet    ARIPiprazole (ABILIFY) 5 MG tablet    3.  PTSD (post-traumatic stress disorder)  F43.10        Past Psychiatric History: alcohol use disorder, severe, generalized anxiety disorder, panic disorder, PTSD, and tobacco use disorder   Past Medical History:  Past Medical History:  Diagnosis Date   Alcohol use disorder, severe, in early remission (HCC) 07/08/2017   Anxiety    Carpal tunnel syndrome of right  wrist 06/10/2019   Depression    Genital herpes    History of chronic hypertension 12/24/2023   'long time ago' never on meds    ASA, baseline labs wnl     History of scoliosis 04/22/2024   HPV (human papilloma virus) anogenital infection    Hypertension 06/22/2017   Migraines    PTSD (post-traumatic stress disorder)     Past Surgical History:  Procedure Laterality Date   BACK SURGERY  2011   scoliosis repair   DG SCOLIOSIS SERIES ([facility] HX)      Family Psychiatric History: Schizophrenia in maternal aunt and grandmother Everyone in family drinks  Family History:  Family History  Problem Relation Age of Onset   Healthy Mother    Hypertension Father    Multiple sclerosis Sister    Schizophrenia Maternal Grandmother    Hypertension Paternal Grandfather    Diabetes Maternal Aunt    Diabetes Maternal Uncle     Social History:  Social History   Socioeconomic History   Marital status: Single    Spouse name: Not on file   Number of children: Not on file   Years of education: Not on file   Highest education level: Not on file  Occupational History   Not on file  Tobacco Use   Smoking status: Former    Current packs/day: 0.50    Average packs/day: 0.5 packs/day for 9.0 years (4.5 ttl pk-yrs)    Types: E-cigarettes, Cigarettes   Smokeless tobacco: Never  Vaping Use   Vaping status: Former  Substance and Sexual Activity   Alcohol use: Not Currently    Comment: last use 09/03/24   Drug use: Not Currently   Sexual activity: Yes    Birth control/protection: Implant    Comment: nexplanon   Other Topics Concern   Not on file  Social History Narrative   Not on file   Social Drivers of Health   Tobacco Use: Medium Risk (10/19/2024)   Patient History    Smoking Tobacco Use: Former    Smokeless Tobacco Use: Never    Passive Exposure: Not on Actuary Strain: Low Risk (12/24/2023)   Overall Financial Resource Strain (CARDIA)    Difficulty of Paying Living  Expenses: Not hard at all  Food Insecurity: No Food Insecurity (06/20/2024)   Epic    Worried About Programme Researcher, Broadcasting/film/video in the Last Year: Never true    Ran Out of Food in the Last Year: Never true  Transportation Needs: No Transportation Needs (06/20/2024)   Epic    Lack of Transportation (Medical): No    Lack of Transportation (Non-Medical): No  Physical Activity: Inactive (12/24/2023)   Exercise Vital Sign    Days of Exercise per Week: 2 days    Minutes of Exercise per Session: 0 min  Stress: No Stress Concern Present (12/24/2023)   [employer] of Occupational Health - Occupational Stress Questionnaire    Feeling of Stress : Only a little  Social Connections: Moderately Integrated (12/24/2023)   Social Connection and Isolation Panel    Frequency of Communication with Friends and Family: Three  times a week    Frequency of Social Gatherings with Friends and Family: Three times a week    Attends Religious Services: 1 to 4 times per year    Active Member of Clubs or Organizations: No    Attends Banker Meetings: Never    Marital Status: Living with partner  Depression (PHQ2-9): High Risk (10/19/2024)   Depression (PHQ2-9)    PHQ-2 Score: 20  Alcohol Screen: Low Risk (10/19/2024)   Alcohol Screen    Last Alcohol Screening Score (AUDIT): 4  Housing: Low Risk (06/20/2024)   Epic    Unable to Pay for Housing in the Last Year: No    Number of Times Moved in the Last Year: 0    Homeless in the Last Year: No  Utilities: Not At Risk (06/20/2024)   Epic    Threatened with loss of utilities: No  Health Literacy: Adequate Health Literacy (12/24/2023)   B1300 Health Literacy    Frequency of need for help with medical instructions: Never    Allergies:  Allergies  Allergen Reactions   Lurasidone  Hcl Rash    Patient reports developing facial rash while taking lurasidone  with notable improvement after stopping    Metabolic Disorder Labs: Lab Results  Component Value Date    HGBA1C 5.7 (H) 12/24/2023   MPG 105.41 07/09/2017   No results found for: PROLACTIN Lab Results  Component Value Date   CHOL 285 (H) 07/09/2017   TRIG 124 07/09/2017   HDL 56 07/09/2017   CHOLHDL 5.1 07/09/2017   VLDL 25 07/09/2017   LDLCALC 204 (H) 07/09/2017   Lab Results  Component Value Date   TSH 1.421 07/09/2017    Therapeutic Level Labs: No results found for: LITHIUM No results found for: VALPROATE No results found for: CBMZ  Current Medications: Current Outpatient Medications  Medication Sig Dispense Refill   ARIPiprazole (ABILIFY) 5 MG tablet Take 1 tablet (5 mg total) by mouth daily. 30 tablet 3   sertraline  (ZOLOFT ) 100 MG tablet Take 2 tablets (200 mg total) by mouth daily. 60 tablet 3   gabapentin  (NEURONTIN ) 300 MG capsule Take 1 capsule (300 mg total) by mouth 3 (three) times daily. 90 capsule 3   naltrexone  (DEPADE) 50 MG tablet Take 1 tablet (50 mg) daily for 1 week then increase to 2 tablets (100 mg) daily. 60 tablet 3   Prenatal Vit-Fe Fumarate-FA (PRENATAL VITAMINS PLUS PO) Take by mouth.     No current facility-administered medications for this visit.     Musculoskeletal: Strength & Muscle Tone: within normal limits Gait & Station: normal Patient leans: N/A  Psychiatric Specialty Exam: Review of Systems  not currently breastfeeding.There is no height or weight on file to calculate BMI.  General Appearance: Well Groomed  Eye Contact:  Good  Speech:  Clear and Coherent and Normal Rate  Volume:  Normal  Mood:  Euthymic  Affect:  Appropriate and Congruent  Thought Process:  Coherent, Goal Directed, and Linear  Orientation:  Full (Time, Place, and Person)  Thought Content: WDL and Logical   Suicidal Thoughts:  No  Homicidal Thoughts:  No  Memory:  Immediate;   Good Recent;   Good Remote;   Good  Judgement:  Good  Insight:  Good  Psychomotor Activity:  Normal  Concentration:  Concentration: Good and Attention Span: Good  Recall:   Good  Fund of Knowledge: Good  Language: Good  Akathisia:  No  Handed:  Right  AIMS (if indicated): not  done  Assets:  Communication Skills Desire for Improvement Financial Resources/Insurance Housing Intimacy Physical Health Social Support Talents/Skills Transportation  ADL's:  Intact  Cognition: WNL  Sleep:  Poor   Screenings: AIMS    Flowsheet Row Admission (Discharged) from 07/08/2017 in [facility]PATIENT [facility]INE  AIMS Total Score 0   AUDIT    Flowsheet Row Video Visit from 10/19/2024 in [facility] Admission (Discharged) from 07/08/2017 in [facility] INPATIENT BEH[facility]AL MEDICINE  Alcohol Use Disorder Identification Test Final Score (AUDIT) 4 17   CAGE-AID    Flowsheet Row Counselor from 03/24/2020 in [facility]  CAGE-AID Score 3   GAD-7    Flowsheet Row Video Visit from 10/19/2024 in [facility] Office Visit from 07/24/2024 in [facility] Routine Prenatal from 05/19/2024 in [facility] for [facility] at [place] Initial Prenatal from 12/24/2023 in [facility] for [facility] at [facility] Office Visit from 11/26/2023 in [facility]  Total GAD-7 Score 16 12 15 1 4    PHQ2-9    Flowsheet Row Video Visit from 10/19/2024 in [facility] Office Visit from 07/24/2024 in [facility] Routine Prenatal from 05/19/2024 in [facility] for [facility] Healthcare at [place] Initial Prenatal from 12/24/2023 in [facility] for [facility] Counselor from 12/05/2023 in [facility]  PHQ-2 Total Score 5 4 4  0 1  PHQ-9 Total Score 20 15 12 6 5    Flowsheet Row ED to Hosp-Admission (Discharged) from 06/20/2024 in [facility] from 04/23/2024 in [facility] Emergency Department  at [facility] ED from 02/10/2024 in [facility] Emergency Department at [facility]  C-SSRS RISK CATEGORY No Risk No Risk No Risk     Assessment and Plan: Patient reports that her anxiety, depression, and are sleep gas worsened since her last visit. Today patient request that Zoloft  to be increased.  Zoloft  150 increased to 200 mg daily.  Patient also agreeable to starting Abilify 5 mg daily to help manage depression.  Patient has been taking gabapentin  3 times daily instead of twice daily.  At this time provider agreeable to continuing this.  She will take naltrexone  at night. She will continue her other medications as prescribed  1. MDD (major depressive disorder), recurrent episode, moderate (HCC) (Primary)  Increased- sertraline  (ZOLOFT ) 100 MG tablet; Take 2 tablets (200 mg total) by mouth daily.  Dispense: 60 tablet; Refill: 3 Start- ARIPiprazole (ABILIFY) 5 MG tablet; Take 1 tablet (5 mg total) by mouth daily.  Dispense: 30 tablet; Refill: 3  2. Alcohol use disorder, moderate, dependence (HCC)  Continue- naltrexone  (DEPADE) 50 MG tablet; Take 1 tablet (50 mg) daily for 1 week then increase to 2 tablets (100 mg) daily.  Dispense: 60 tablet; Refill: 3 Increased- gabapentin  (NEURONTIN ) 300 MG capsule; Take 1 capsule (300 mg total) by mouth 3 (three) times daily.  Dispense: 90 capsule; Refill: 3 Increased- sertraline  (ZOLOFT ) 100 MG tablet; Take 2 tablets (200 mg total) by mouth daily.  Dispense: 60 tablet; Refill: 3 Start- ARIPiprazole (ABILIFY) 5 MG tablet; Take 1 tablet (5 mg total) by mouth daily.  Dispense: 30 tablet; Refill: 3  3. PTSD (post-traumatic stress disorder)    Collaboration of Care: Collaboration of Care: Primary Care Provider AEB PCP, primary psychiatric provider, therapist  Patient/Guardian was advised Release of  Information must be obtained prior to any record release in order to collaborate their care with an outside provider. Patient/Guardian was  advised if they have not already done so to contact the registration department to sign all necessary forms in order for us  to release information regarding their care.   Consent: Patient/Guardian gives verbal consent for treatment and assignment of benefits for services provided during this visit. Patient/Guardian expressed understanding and agreed to proceed.   Follow-up in 2.5 months Follow-up with therapy Zane FORBES Bach, NP 10/19/2024, 3:36 PM

## 2024-10-21 ENCOUNTER — Ambulatory Visit: Payer: Self-pay

## 2024-10-21 NOTE — Telephone Encounter (Signed)
 FYI Only or Action Required?: FYI only for provider: appointment scheduled on 12/21/2024 with Dr. Beverley Hummer r/t TOC.  Patient was last seen in primary care on 07/24/2024 by Jerrell Cleatus Ned, MD.  Called Nurse Triage reporting Back Pain.  Symptoms began x 2 weeks.  Interventions attempted: Nothing.  Symptoms are: unchanged.  Triage Disposition: See PCP Within 2 Weeks  Patient/caregiver understands and will follow disposition?: Yes   Copied from CRM #[id number]. Topic: Clinical - Red Word Triage >> Oct 21, 2024 11:09 AM Adelita E wrote: [facility] that prompted transfer to Nurse Triage: Tingling sensation and numbness in hands. Patient also having lower back pain. Reason for Disposition  Back pain present > 2 weeks    Pt only wants to be seen by Dr. Beverley Hummer therefore [facility] appt scheduled for 12/2024  Answer Assessment - Initial Assessment Questions 1. ONSET: When did the pain begin? (e.g., minutes, hours, days)     X 2 weeks 2. LOCATION: Where does it hurt? (upper, mid or lower back)     Low back 3. SEVERITY: How bad is the pain?  (e.g., Scale 1-10; mild, moderate, or severe)     4/10 4. PATTERN: Is the pain constant? (e.g., yes, no; constant, intermittent)      constant 5. RADIATION: Does the pain shoot into your legs or somewhere else?     no 6. CAUSE:  What do you think is causing the back pain?      unsure 7. BACK OVERUSE:  Any recent lifting of heavy objects, strenuous work or exercise?     na 8. MEDICINES: What have you taken so far for the pain? (e.g., nothing, acetaminophen , NSAIDS)     Tylenol , gabapentin  9. NEUROLOGIC SYMPTOMS: Do you have any weakness, numbness, or problems with bowel/bladder control?     Numbness and Tingling bilateral hands ongoing since [redacted] weeks pregnant and had baby on 06/22/2024 10. OTHER SYMPTOMS: Do you have any other symptoms? (e.g., fever, abdomen pain, burning with urination, blood in urine)       na 11.  PREGNANCY: Is there any chance you are pregnant? When was your last menstrual period?       Na  Pt only wants to be seen by Dr. Beverley Hummer - therefore pt will be transferring care and appt is scheduled  Protocols used: Back Pain-A-AH

## 2024-10-29 ENCOUNTER — Ambulatory Visit: Payer: MEDICAID | Admitting: Adult Health

## 2024-10-29 ENCOUNTER — Encounter: Payer: Self-pay | Admitting: Adult Health

## 2024-10-29 ENCOUNTER — Other Ambulatory Visit (HOSPITAL_COMMUNITY)
Admission: RE | Admit: 2024-10-29 | Discharge: 2024-10-29 | Disposition: A | Payer: MEDICAID | Source: Ambulatory Visit | Attending: Adult Health | Admitting: Adult Health

## 2024-10-29 VITALS — BP 113/75 | HR 100 | Ht 64.0 in | Wt 168.5 lb

## 2024-10-29 DIAGNOSIS — R200 Anesthesia of skin: Secondary | ICD-10-CM | POA: Insufficient documentation

## 2024-10-29 DIAGNOSIS — R202 Paresthesia of skin: Secondary | ICD-10-CM | POA: Diagnosis not present

## 2024-10-29 DIAGNOSIS — R319 Hematuria, unspecified: Secondary | ICD-10-CM | POA: Insufficient documentation

## 2024-10-29 DIAGNOSIS — R102 Pelvic and perineal pain unspecified side: Secondary | ICD-10-CM | POA: Insufficient documentation

## 2024-10-29 DIAGNOSIS — R8290 Unspecified abnormal findings in urine: Secondary | ICD-10-CM | POA: Diagnosis not present

## 2024-10-29 DIAGNOSIS — R350 Frequency of micturition: Secondary | ICD-10-CM | POA: Insufficient documentation

## 2024-10-29 DIAGNOSIS — N926 Irregular menstruation, unspecified: Secondary | ICD-10-CM | POA: Insufficient documentation

## 2024-10-29 LAB — POCT URINALYSIS DIPSTICK
Glucose, UA: NEGATIVE
Ketones, UA: NEGATIVE
Leukocytes, UA: NEGATIVE
Nitrite, UA: NEGATIVE
Protein, UA: NEGATIVE

## 2024-10-29 NOTE — Progress Notes (Signed)
 " Subjective:     Patient ID: Victoria [name], female   DOB: 1991/03/22, 34 y.o.   MRN: [id number]  HPI Victoria [name] is a 34 year old black female, single, G1P1001, in complaining of urinary frequency and sharp pains on and off for less than a month and has seen blood in urine. She has had irregular bleeding with nexplanon , had periods 10/14/24 for a week and bleeding again. . She has numbness and tingling in fingers that started when about [redacted] weeks pregnant.      Component Value Date/Time   DIAGPAP  12/24/2023 1057    - Negative for intraepithelial lesion or malignancy (NILM)   HPVHIGH Negative 12/24/2023 1057   ADEQPAP Satisfactory for evaluation.  The absence of an 12/24/2023 1057   ADEQPAP  12/24/2023 1057    endocervical/transformation zone component is not uncommon in pregnant   ADEQPAP patients. 12/24/2023 1057    PCP is Dr Jerrell Review of Systems +urinary frequency and + sharp pains on and off for less than a month and has seen blood in urine +irregular bleeding with nexplanon  + numbness and tingling in fingers that started when about [redacted] weeks pregnant.    Has back pain too  Reviewed past medical,surgical, social and family history. Reviewed medications and allergies.  Objective:   Physical Exam BP 113/75 (BP Location: Left Arm, Patient Position: Sitting, Cuff Size: Normal)   Pulse 100   Ht 5' 4 (1.626 m)   Wt 168 lb 8 oz (76.4 kg)   LMP 10/14/2024   Breastfeeding No   BMI 28.92 kg/m  urine 3+blood, Skin warm and dry.Pelvic: external genitalia is normal in appearance no lesions, vagina: +blood, no odor,urethra has no lesions or masses noted, cervix:smooth and bulbous, uterus: normal size, shape and contour, non tender, no masses felt, adnexa: no masses or tenderness noted. Bladder is non tender and no masses felt. Fingers are warm, negative Tinel's sign and Phalen's sign.    Fall risk is low  Upstream - 10/29/24 1336       Pregnancy Intention Screening   Does the  patient want to become pregnant in the next year? No    Does the patient's partner want to become pregnant in the next year? No    Would the patient like to discuss contraceptive options today? No      Contraception Wrap Up   Current Method Hormonal Implant    End Method Hormonal Implant    Contraception Counseling Provided Yes         Examination chaperoned by Clarita Salt LPN  Assessment:     1. Urinary frequency (Primary) +urinary frequency Will sent urine for UA C&S to rule out UTI - POCT Urinalysis Dipstick - Urine Culture - Urinalysis, Routine w reflex microscopic  2. Abnormal urine odor - POCT Urinalysis Dipstick - Urine Culture - Urinalysis, Routine w reflex microscopic  3. Hematuria, unspecified type - POCT Urinalysis Dipstick - Urine Culture - Urinalysis, Routine w reflex microscopic  4. Irregular bleeding Irregular bleeding with nexplanon  Will get US  11/03/24 at 11:30 am at [facility] to assess uterus and ovaries  CV swab sent  - US  PELVIC COMPLETE WITH TRANSVAGINAL; Future - Cervicovaginal ancillary only( [place])  5. Pelvic pain Has sharp pain on and off for < a month Will get US  11/03/24 at 11:30 am at [facility] to assess uterus and ovaries  CV swab sent for GC/CHL,trich, BV and yeast  - US  PELVIC COMPLETE WITH TRANSVAGINAL; Future - Cervicovaginal ancillary only(  [place])  6. Numbness and tingling numbness and tingling in fingers that started when about [redacted] weeks pregnant Had negative Phalen's sign and Tinel's sign, to see PCP, could still be CTS     Plan:     Follow up prn     "

## 2024-10-30 LAB — URINALYSIS, ROUTINE W REFLEX MICROSCOPIC
Bilirubin, UA: NEGATIVE
Glucose, UA: NEGATIVE
Ketones, UA: NEGATIVE
Leukocytes,UA: NEGATIVE
Nitrite, UA: NEGATIVE
Protein,UA: NEGATIVE
Specific Gravity, UA: 1.011 (ref 1.005–1.030)
Urobilinogen, Ur: 0.2 mg/dL (ref 0.2–1.0)
pH, UA: 5.5 (ref 5.0–7.5)

## 2024-10-30 LAB — MICROSCOPIC EXAMINATION
Bacteria, UA: NONE SEEN
Casts: NONE SEEN /LPF
WBC, UA: NONE SEEN /HPF (ref 0–5)

## 2024-10-31 LAB — URINE CULTURE

## 2024-11-02 ENCOUNTER — Ambulatory Visit: Payer: Self-pay | Admitting: Adult Health

## 2024-11-02 LAB — CERVICOVAGINAL ANCILLARY ONLY
Bacterial Vaginitis (gardnerella): NEGATIVE
Candida Glabrata: NEGATIVE
Candida Vaginitis: NEGATIVE
Chlamydia: NEGATIVE
Comment: NEGATIVE
Comment: NEGATIVE
Comment: NEGATIVE
Comment: NEGATIVE
Comment: NEGATIVE
Comment: NORMAL
Neisseria Gonorrhea: NEGATIVE
Trichomonas: NEGATIVE

## 2024-11-03 ENCOUNTER — Ambulatory Visit (HOSPITAL_COMMUNITY): Payer: MEDICAID

## 2024-11-06 ENCOUNTER — Ambulatory Visit (HOSPITAL_COMMUNITY): Payer: MEDICAID

## 2024-11-10 ENCOUNTER — Ambulatory Visit (HOSPITAL_COMMUNITY): Payer: MEDICAID

## 2024-11-11 ENCOUNTER — Other Ambulatory Visit (HOSPITAL_COMMUNITY): Payer: Self-pay | Admitting: Psychiatry

## 2024-11-11 DIAGNOSIS — F1020 Alcohol dependence, uncomplicated: Secondary | ICD-10-CM

## 2024-11-11 DIAGNOSIS — F331 Major depressive disorder, recurrent, moderate: Secondary | ICD-10-CM

## 2024-11-13 ENCOUNTER — Ambulatory Visit (HOSPITAL_COMMUNITY): Admission: RE | Admit: 2024-11-13 | Payer: MEDICAID

## 2024-11-17 ENCOUNTER — Encounter (HOSPITAL_COMMUNITY): Payer: Self-pay | Admitting: Psychiatry

## 2024-11-17 ENCOUNTER — Telehealth (INDEPENDENT_AMBULATORY_CARE_PROVIDER_SITE_OTHER): Payer: MEDICAID | Admitting: Psychiatry

## 2024-11-17 DIAGNOSIS — F331 Major depressive disorder, recurrent, moderate: Secondary | ICD-10-CM

## 2024-11-17 DIAGNOSIS — F1020 Alcohol dependence, uncomplicated: Secondary | ICD-10-CM

## 2024-11-17 DIAGNOSIS — F10982 Alcohol use, unspecified with alcohol-induced sleep disorder: Secondary | ICD-10-CM

## 2024-11-17 MED ORDER — BUPROPION HCL ER (XL) 150 MG PO TB24: 150.0000 mg | ORAL_TABLET | ORAL | 3 refills | Status: AC

## 2024-11-17 MED ORDER — NALTREXONE HCL 50 MG PO TABS: ORAL_TABLET | ORAL | 3 refills | Status: AC

## 2024-11-17 MED ORDER — ARIPIPRAZOLE 5 MG PO TABS: 5.0000 mg | ORAL_TABLET | Freq: Every day | ORAL | 2 refills | Status: AC

## 2024-11-17 MED ORDER — TRAZODONE HCL 50 MG PO TABS: 50.0000 mg | ORAL_TABLET | Freq: Every day | ORAL | 3 refills | Status: DC

## 2024-11-17 MED ORDER — TRAZODONE HCL 50 MG PO TABS: 50.0000 mg | ORAL_TABLET | Freq: Every evening | ORAL | 3 refills | Status: AC | PRN

## 2024-11-17 MED ORDER — GABAPENTIN 300 MG PO CAPS: 300.0000 mg | ORAL_CAPSULE | Freq: Three times a day (TID) | ORAL | 3 refills | Status: AC

## 2024-11-17 MED ORDER — SERTRALINE HCL 100 MG PO TABS: 200.0000 mg | ORAL_TABLET | Freq: Every day | ORAL | 2 refills | Status: AC

## 2024-11-17 NOTE — Progress Notes (Signed)
 " [facility] MD/PA/NP OP Progress Note Virtual Visit via Video Note  I connected with Victoria [name] on 11/17/24 at  3:00 PM EST by a video enabled telemedicine application and verified that I am speaking with the correct person using two identifiers.  Location: Patient: Home Provider: Clinic   I discussed the limitations of evaluation and management by telemedicine and the availability of in person appointments. The patient expressed understanding and agreed to proceed.  I provided 30 minutes of non-face-to-face time during this encounter.    11/17/2024 11:39 AM Victoria [name]  MRN:  [id number]  Chief Complaint:  I am drinking to sleep and energy is low  HPI:  34 y.o. female seen today for follow up psychiatric evaluation. She has a psychiatric history of alcohol use disorder, generalized anxiety disorder, panic disorder, PTSD, and tobacco use disorder and a historical diagnosis of schizoaffective disorder, bipolar II disorder, and ADHD inattentive type. Currently she is managed on  Naltrexone  100 mg daily and gabapentin  300 mg three times daily, Abilify  5 mg, Zoloft  200 mg. reports that her medications are somewhat effective in managing her psychiatric conditions.  Today she was well-groomed, pleasant, cooperative, engaged in conversation.  She informed clinical research associate that she has been drinking alcohol to sleep and notes that her energy is low.  Patient notes that she wakes up every 2 hours.  She informed clinical research associate that taking naltrexone  at night did not help her sleep or reduce her alcohol consumption.  Provider informed patient that alcohol can induce insomnia.  Patient informed writer that she would like to restart trazodone  at a low dose.  Provider was agreeable.  Today provider conducted an audit assessment and patient scored a 16.   Since increasing Zoloft  and starting Abilify  patient notes that her mood has somewhat improved.  She notes that she is less anxious and depressed.  She  informed clinical research associate that she has been trying to relax and focus on herself.  Today provider conducted GAD-7 and patient scored a 3, at her last visit she scored a 16.  Provider also conducted PHQ-9 and PHQ a 10, at her last visit she scored a 20.  Today she denies SI/HI/AVH, mania, paranoia.  She informed clinical research associate that her appetite has increased and notes that she has gained a significant amount of weight.  She asked writer if she had medication to help manage weight.  Patient informed clinical research associate that her 73-month-year-old son is doing well.  She informed clinical research associate that he is sleeping better.   Today patient request to reduce naltrexone .  Naltrexone  reduced to 50 mg.  She informed clinical research associate that she might be interested in trialing Antabuse at her next visit.  She will start trazodone  50 mg nightly as needed to help manage sleep.  Patient also agreeable to starting Wellbutrin  XL 150 mg to help manage appetite, energy, and depression.  She will continue her other medication as prescribed.Potential side effects of medication and risks vs benefits of treatment vs non-treatment were explained and discussed. All questions were answered.  No other concerns at this time.  Visit Diagnosis:    ICD-10-CM   1. 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 MDD (major depressive disorder), recurrent episode, moderate (HCC)  F33.1 buPROPion  (WELLBUTRIN  XL) 150 MG 24 hr tablet    sertraline  (ZOLOFT ) 100 MG tablet    ARIPiprazole  (ABILIFY ) 5 MG tablet    3. Alcohol use disorder, moderate, dependence (HCC)  F10.20 sertraline  (ZOLOFT )  100 MG tablet    gabapentin  (NEURONTIN ) 300 MG capsule    ARIPiprazole  (ABILIFY ) 5 MG tablet    naltrexone  (DEPADE) 50 MG tablet        Past Psychiatric History: alcohol use disorder, severe, generalized anxiety disorder, panic disorder, PTSD, and tobacco use disorder   Past Medical History:  Past Medical History:  Diagnosis Date   Alcohol use disorder, severe,  in early remission (HCC) 07/08/2017   Anxiety    Carpal tunnel syndrome of right wrist 06/10/2019   Depression    Genital herpes    History of chronic hypertension 12/24/2023   'long time ago' never on meds    ASA, baseline labs wnl     History of scoliosis 04/22/2024   HPV (human papilloma virus) anogenital infection    Hypertension 06/22/2017   Migraines    PTSD (post-traumatic stress disorder)     Past Surgical History:  Procedure Laterality Date   BACK SURGERY  2011   scoliosis repair   DG SCOLIOSIS SERIES ([facility] HX)      Family Psychiatric History: Schizophrenia in maternal aunt and grandmother Everyone in family drinks  Family History:  Family History  Problem Relation Age of Onset   Hypertension Paternal Grandfather    Schizophrenia Maternal Grandmother    Hypertension Father    Healthy Mother    Multiple sclerosis Sister    Cancer Maternal Aunt        ovarian   Diabetes Maternal Aunt    Diabetes Maternal Uncle     Social History:  Social History   Socioeconomic History   Marital status: Single    Spouse name: Not on file   Number of children: Not on file   Years of education: Not on file   Highest education level: Not on file  Occupational History   Not on file  Tobacco Use   Smoking status: Former    Current packs/day: 0.50    Average packs/day: 0.5 packs/day for 9.0 years (4.5 ttl pk-yrs)    Types: Cigarettes   Smokeless tobacco: Never  Vaping Use   Vaping status: Every Day  Substance and Sexual Activity   Alcohol use: Not Currently    Comment: last use 09/03/24   Drug use: Not Currently   Sexual activity: Yes    Birth control/protection: Implant    Comment: nexplanon   Other Topics Concern   Not on file  Social History Narrative   Not on file   Social Drivers of Health   Tobacco Use: Medium Risk (10/29/2024)   Patient History    Smoking Tobacco Use: Former    Smokeless Tobacco Use: Never    Passive Exposure: Not on Surveyor, Minerals Strain: Low Risk (12/24/2023)   Overall Financial Resource Strain (CARDIA)    Difficulty of Paying Living Expenses: Not hard at all  Food Insecurity: No Food Insecurity (06/20/2024)   Epic    Worried About Programme Researcher, Broadcasting/film/video in the Last Year: Never true    Ran Out of Food in the Last Year: Never true  Transportation Needs: No Transportation Needs (06/20/2024)   Epic    Lack of Transportation (Medical): No    Lack of Transportation (Non-Medical): No  Physical Activity: Inactive (12/24/2023)   Exercise Vital Sign    Days of Exercise per Week: 2 days    Minutes of Exercise per Session: 0 min  Stress: No Stress Concern Present (12/24/2023)   [employer] of Occupational Health - Occupational Stress Questionnaire  Feeling of Stress : Only a little  Social Connections: Moderately Integrated (12/24/2023)   Social Connection and Isolation Panel    Frequency of Communication with Friends and Family: Three times a week    Frequency of Social Gatherings with Friends and Family: Three times a week    Attends Religious Services: 1 to 4 times per year    Active Member of Clubs or Organizations: No    Attends Banker Meetings: Never    Marital Status: Living with partner  Depression (PHQ2-9): Medium Risk (11/17/2024)   Depression (PHQ2-9)    PHQ-2 Score: 10  Alcohol Screen: High Risk (11/17/2024)   Alcohol Screen    Last Alcohol Screening Score (AUDIT): 16  Housing: Low Risk (06/20/2024)   Epic    Unable to Pay for Housing in the Last Year: No    Number of Times Moved in the Last Year: 0    Homeless in the Last Year: No  Utilities: Not At Risk (06/20/2024)   Epic    Threatened with loss of utilities: No  Health Literacy: Adequate Health Literacy (12/24/2023)   B1300 Health Literacy    Frequency of need for help with medical instructions: Never    Allergies:  Allergies  Allergen Reactions   Lurasidone  Hcl Rash    Patient reports developing facial rash while taking  lurasidone  with notable improvement after stopping    Metabolic Disorder Labs: Lab Results  Component Value Date   HGBA1C 5.7 (H) 12/24/2023   MPG 105.41 07/09/2017   No results found for: PROLACTIN Lab Results  Component Value Date   CHOL 285 (H) 07/09/2017   TRIG 124 07/09/2017   HDL 56 07/09/2017   CHOLHDL 5.1 07/09/2017   VLDL 25 07/09/2017   LDLCALC 204 (H) 07/09/2017   Lab Results  Component Value Date   TSH 1.421 07/09/2017    Therapeutic Level Labs: No results found for: LITHIUM No results found for: VALPROATE No results found for: CBMZ  Current Medications: Current Outpatient Medications  Medication Sig Dispense Refill   buPROPion  (WELLBUTRIN  XL) 150 MG 24 hr tablet Take 1 tablet (150 mg total) by mouth every morning. 30 tablet 3   ARIPiprazole  (ABILIFY ) 5 MG tablet Take 1 tablet (5 mg total) by mouth daily. 90 tablet 2   etonogestrel  (NEXPLANON ) 68 MG IMPL implant 1 each by Subdermal route once.     gabapentin  (NEURONTIN ) 300 MG capsule Take 1 capsule (300 mg total) by mouth 3 (three) times daily. 90 capsule 3   naltrexone  (DEPADE) 50 MG tablet Take 1 tablet (50 mg) daily for 1 week then increase to 2 tablets (100 mg) daily. 60 tablet 3   Prenatal Vit-Fe Fumarate-FA (PRENATAL VITAMINS PLUS PO) Take by mouth.     sertraline  (ZOLOFT ) 100 MG tablet Take 2 tablets (200 mg total) by mouth daily. 180 tablet 2   traZODone  (DESYREL ) 50 MG tablet Take 1 tablet (50 mg total) by mouth at bedtime as needed for sleep. 30 tablet 3   No current facility-administered medications for this visit.     Musculoskeletal: Strength & Muscle Tone: within normal limits Gait & Station: normal Patient leans: N/A  Psychiatric Specialty Exam: Review of Systems  Last menstrual period 10/14/2024, not currently breastfeeding.There is no height or weight on file to calculate BMI.  General Appearance: Well Groomed  Eye Contact:  Good  Speech:  Clear and Coherent and Normal Rate   Volume:  Normal  Mood:  Euthymic  Affect:  Appropriate  and Congruent  Thought Process:  Coherent, Goal Directed, and Linear  Orientation:  Full (Time, Place, and Person)  Thought Content: WDL and Logical   Suicidal Thoughts:  No  Homicidal Thoughts:  No  Memory:  Immediate;   Good Recent;   Good Remote;   Good  Judgement:  Good  Insight:  Good  Psychomotor Activity:  Normal  Concentration:  Concentration: Good and Attention Span: Good  Recall:  Good  Fund of Knowledge: Good  Language: Good  Akathisia:  No  Handed:  Right  AIMS (if indicated): not done  Assets:  Communication Skills Desire for Improvement Financial Resources/Insurance Housing Intimacy Physical Health Social Support Talents/Skills Transportation  ADL's:  Intact  Cognition: WNL  Sleep:  Poor   Screenings: AIMS    Flowsheet Row Admission (Discharged) [facility] 07[facility]08/2017 [facility]  AIMS Total Score 0   AUDIT    Flowsheet Row Video Visit from 11/17/2024 in [facility] Video Visit from 10/19/2024 in [facility] Admission (Discharged) from 07/08/2017 in [facility] INPATIENT BEHAV[facility]AL MEDICINE  Alcohol Use Disorder Identification Test Final Score (AUDIT) 16 4 17    CAGE-AID    Flowsheet Row Counselor from 03/24/2020 in [facility]  CAGE-AID Score 3   GAD-7    Flowsheet Row Video Visit from 11/17/2024 in [facility] Video Visit from 10/19/2024 in [facility] Office Visit from 07/24/2024 in [facility] Routine Prenatal from 05/19/2024 in [facility] for [facility] at [place] Initial Prenatal from 12/24/2023 in [facility]  Total GAD-7 Score 3 16 12 15 1    PHQ2-9    Flowsheet Row Video Visit from 11/17/2024 in [facility]  Video Visit from 10/19/2024 in [facility] Office Visit from 07/24/2024 [facility] Routine Prenatal from 05/19/2024 in [facility] for [facility] at [place] Initial Prenatal from 12/24/2023 in [facility] at [facility]  PHQ-2 Total Score 1 5 4 4  0  PHQ-9 Total Score 10 20 15 12 6    Flowsheet Row ED to Hosp-Admission (Discharged) from 06/20/2024 in [facility] from 04/23/2024 in [facility] Emergency Department at [facility] ED from 02/10/2024 in [facility] Department at [facility]  C-SSRS RISK CATEGORY No Risk No Risk No Risk     Assessment and Plan: Patient reports that her anxiety, depression, are better managed but notes that her sleep continues to be poor.Today patient request to reduce naltrexone  as it is not impacting her alcohol use/sleep.  Naltrexone  reduced to 50 mg.  She informed clinical research associate that she might be interested in trialing Antabuse at her next visit.  She will start trazodone  50 mg nightly as needed to help manage sleep.  Patient also agreeable to starting Wellbutrin  XL 150 mg to help manage appetite, energy, and depression.  Provider discussed the risk of serotonin syndrome with patient.  She endorsed understanding 1. MDD (major depressive disorder), recurrent episode, moderate (HCC)  Start- buPROPion  (WELLBUTRIN  XL) 150 MG 24 hr tablet; Take 1 tablet (150 mg total) by mouth every morning.  Dispense: 30 tablet; Refill: 3 Continue- sertraline  (ZOLOFT ) 100 MG tablet; Take 2 tablets (200 mg total) by mouth daily.  Dispense: 180 tablet; Refill: 2 Continue- ARIPiprazole  (ABILIFY ) 5 MG tablet;  Take 1 tablet (5 mg total) by mouth daily.  Dispense: 90 tablet; Refill: 2  2. Alcohol use disorder, moderate, dependence (HCC)  Continue- sertraline  (ZOLOFT ) 100 MG tablet; Take 2 tablets (200 mg total) by mouth daily.  Dispense: 180 tablet;  Refill: 2 Continue- gabapentin  (NEURONTIN ) 300 MG capsule; Take 1 capsule (300 mg total) by mouth 3 (three) times daily.  Dispense: 90 capsule; Refill: 3 Continue- ARIPiprazole  (ABILIFY ) 5 MG tablet; Take 1 tablet (5 mg total) by mouth daily.  Dispense: 90 tablet; Refill: 2 Reduced- naltrexone  (DEPADE) 50 MG tablet; Take 1 tablet (50 mg) daily for 1 week then increase to 2 tablets (100 mg) daily.  Dispense: 60 tablet; Refill: 3  3. 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  Dispense: 30 tablet; Refill: 3     Collaboration of Care: Collaboration of Care: Primary Care Provider AEB PCP, primary psychiatric provider, therapist  Patient/Guardian was advised Release of Information must be obtained prior to any record release in order to collaborate their care with an outside provider. Patient/Guardian was advised if they have not already done so to contact the registration department to sign all necessary forms in order for us  to release information regarding their care.   Consent: Patient/Guardian gives verbal consent for treatment and assignment of benefits for services provided during this visit. Patient/Guardian expressed understanding and agreed to proceed.   Follow-up in 2.5 months Follow-up with therapy Zane FORBES Bach, NP 11/17/2024, 11:39 AM  "

## 2024-11-20 ENCOUNTER — Ambulatory Visit (HOSPITAL_COMMUNITY)
Admission: RE | Admit: 2024-11-20 | Discharge: 2024-11-20 | Disposition: A | Payer: MEDICAID | Source: Ambulatory Visit | Attending: Adult Health | Admitting: Adult Health

## 2024-11-20 DIAGNOSIS — N926 Irregular menstruation, unspecified: Secondary | ICD-10-CM | POA: Insufficient documentation

## 2024-11-20 DIAGNOSIS — R102 Pelvic and perineal pain unspecified side: Secondary | ICD-10-CM | POA: Diagnosis present

## 2024-12-21 ENCOUNTER — Encounter: Payer: MEDICAID | Admitting: Family Medicine

## 2024-12-31 ENCOUNTER — Encounter (HOSPITAL_COMMUNITY): Payer: MEDICAID | Admitting: Psychiatry

## 2024-12-31 ENCOUNTER — Encounter: Payer: MEDICAID | Admitting: Psychology

## 2025-01-14 ENCOUNTER — Telehealth (HOSPITAL_COMMUNITY): Payer: MEDICAID | Admitting: Psychiatry
